# Patient Record
Sex: Female | Born: 1965
Health system: Southern US, Community
[De-identification: ages and names within clinical notes are randomized; demographics above are authoritative.]

## PROBLEM LIST (undated history)

## (undated) DIAGNOSIS — Z973 Presence of spectacles and contact lenses: Secondary | ICD-10-CM

## (undated) DIAGNOSIS — K219 Gastro-esophageal reflux disease without esophagitis: Secondary | ICD-10-CM

## (undated) DIAGNOSIS — F32A Depression, unspecified: Secondary | ICD-10-CM

## (undated) DIAGNOSIS — Z8489 Family history of other specified conditions: Secondary | ICD-10-CM

## (undated) DIAGNOSIS — C801 Malignant (primary) neoplasm, unspecified: Secondary | ICD-10-CM

## (undated) DIAGNOSIS — T7840XA Allergy, unspecified, initial encounter: Secondary | ICD-10-CM

## (undated) DIAGNOSIS — F419 Anxiety disorder, unspecified: Secondary | ICD-10-CM

## (undated) HISTORY — PX: TONSILLECTOMY AND ADENOIDECTOMY: SUR1326

## (undated) HISTORY — DX: Depression, unspecified: F32.A

## (undated) HISTORY — DX: Anxiety disorder, unspecified: F41.9

## (undated) HISTORY — DX: Allergy, unspecified, initial encounter: T78.40XA

## (undated) HISTORY — DX: Gastro-esophageal reflux disease without esophagitis: K21.9

---

## 1994-03-21 HISTORY — PX: OTHER SURGICAL HISTORY: SHX169

## 2005-11-03 ENCOUNTER — Ambulatory Visit: Payer: Self-pay | Admitting: Unknown Physician Specialty

## 2007-04-06 ENCOUNTER — Ambulatory Visit: Payer: Self-pay | Admitting: Unknown Physician Specialty

## 2008-07-28 ENCOUNTER — Ambulatory Visit: Payer: Self-pay | Admitting: Unknown Physician Specialty

## 2009-01-29 ENCOUNTER — Ambulatory Visit: Payer: Self-pay | Admitting: General Surgery

## 2010-08-06 ENCOUNTER — Ambulatory Visit: Payer: Self-pay | Admitting: Unknown Physician Specialty

## 2010-08-20 HISTORY — PX: DILATION AND CURETTAGE OF UTERUS: SHX78

## 2010-08-23 ENCOUNTER — Ambulatory Visit: Payer: Self-pay

## 2010-08-31 ENCOUNTER — Ambulatory Visit: Payer: Self-pay

## 2010-09-02 LAB — PATHOLOGY REPORT

## 2010-11-15 LAB — HM PAP SMEAR: HM Pap smear: NORMAL

## 2011-10-05 ENCOUNTER — Ambulatory Visit: Payer: Self-pay | Admitting: Internal Medicine

## 2011-10-10 ENCOUNTER — Ambulatory Visit (INDEPENDENT_AMBULATORY_CARE_PROVIDER_SITE_OTHER): Payer: PRIVATE HEALTH INSURANCE | Admitting: Internal Medicine

## 2011-10-10 ENCOUNTER — Encounter: Payer: Self-pay | Admitting: Internal Medicine

## 2011-10-10 VITALS — BP 112/68 | HR 85 | Temp 98.4°F | Resp 16 | Ht 67.5 in | Wt 176.8 lb

## 2011-10-10 DIAGNOSIS — J309 Allergic rhinitis, unspecified: Secondary | ICD-10-CM

## 2011-10-10 DIAGNOSIS — Z1239 Encounter for other screening for malignant neoplasm of breast: Secondary | ICD-10-CM

## 2011-10-10 DIAGNOSIS — Z6825 Body mass index (BMI) 25.0-25.9, adult: Secondary | ICD-10-CM

## 2011-10-10 DIAGNOSIS — E663 Overweight: Secondary | ICD-10-CM | POA: Insufficient documentation

## 2011-10-10 DIAGNOSIS — K644 Residual hemorrhoidal skin tags: Secondary | ICD-10-CM | POA: Insufficient documentation

## 2011-10-10 DIAGNOSIS — K219 Gastro-esophageal reflux disease without esophagitis: Secondary | ICD-10-CM | POA: Insufficient documentation

## 2011-10-10 DIAGNOSIS — J302 Other seasonal allergic rhinitis: Secondary | ICD-10-CM | POA: Insufficient documentation

## 2011-10-10 MED ORDER — HYDROCORTISONE ACETATE 25 MG RE SUPP: 25.0000 mg | Freq: Two times a day (BID) | RECTAL | Status: AC

## 2011-10-10 NOTE — Assessment & Plan Note (Signed)
Mild, with recent weight gain due to inactivity. Discussed resuming daily exercise now that her children are all in activities. Discussed the role of diet next size and weight management. Screening for thyroid and lipids and diabetes with next employee lab draw in October.

## 2011-10-10 NOTE — Assessment & Plan Note (Signed)
Intermittent and infrequent. Discussed using fiber supplements during episodes of loose stools to help bulk up stools, and Anusol HC versus Sitz baths to manage irritation. No indication for surgery at this time

## 2011-10-10 NOTE — Progress Notes (Signed)
Patient ID: Paula Jensen, female   DOB: Jul 05, 1965, 46 y.o.   MRN: [id number] Patient Active Problem List  Diagnosis  . Allergic rhinitis, seasonal  . GERD (gastroesophageal reflux disease)  . Overweight (BMI 25.0-29.9)  . External hemorrhoid, bleeding    Subjective:  CC:   Chief Complaint  Patient presents with  . New Patient    HPI:   Paula Jensen is a 46 y.o. female who presents as a new patient to establish primary care with the chief complaint of  Need for mammogram.  Lori's previous PCP was Francia Greaves,  Who was her PCP since 1999.  Last PAP Nov 2012.  All have been normal.  Mammogram due as of March.   3 kids,  Ages 46 7 and 61 .  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  Uses citalopram and prn alprazolam for anxiety disorder which began after birth of second a child. Has tried stopping them, periodically but has increased symptoms of irritability and anger without them.  Notices a dizzy feeling when she misses her dose of citalopram.  Relaxes with enjoyed activities including  gardening, cooking ,  And reading .  Drinks a glass of wine per night.  Has some night time hot flashes without actual sweats. Has a history of hemorrhoids,  agravated by infrequent episodes of  loose stools. Uses OTC suppositories prn.    Past Medical History  Diagnosis Date  . allergic rhinitis   . GERD (gastroesophageal reflux disease)     Past Surgical History  Procedure Date  . Dilation and curettage of uterus June 2012    [place] , for heavy bleeding   . Tonsillectomy and adenoidectomy   . Laparoscopy 1996    Family History  Problem Relation Age of Onset  . Heart disease Father     atrial fibrillation  . Cancer Neg Hx     History   Social History  . Marital Status: Married    Spouse Name: N/A    Number of Children: N/A  . Years of Education: N/A   Occupational History  . Not on file.   Social History Main  Topics  . Smoking status: Never Smoker   . Smokeless tobacco: Never Used  . Alcohol Use: Yes  . Drug Use: No  . Sexually Active: Not on file   Other Topics Concern  . Not on file   Social History Narrative  . No narrative on file    No Known Allergies   Review of Systems:  Review of Systems  Constitutional: Negative for fever, malaise/fatigue and diaphoresis.  HENT: Negative for ear pain and sore throat.   Eyes: Negative for blurred vision and pain.  Respiratory: Negative for cough and shortness of breath.   Cardiovascular: Negative for chest pain, orthopnea and leg swelling.  Gastrointestinal: Positive for heartburn and blood in stool. Negative for nausea and abdominal pain.  Genitourinary: Negative for dysuria and hematuria.  Musculoskeletal: Negative for myalgias and falls.  Skin: Negative for rash.  Neurological: Negative for dizziness and headaches.  Endo/Heme/Allergies: Does not bruise/bleed easily.  Psychiatric/Behavioral: Negative for depression. The patient is nervous/anxious.       Objective:  BP 112/68  Pulse 85  Temp 98.4 F (36.9 C) (Oral)  Resp 16  Ht 5' 7.5" (1.715 m)  Wt 176 lb 12 oz (80.173 kg)  BMI 27.27 kg/m2  SpO2  96%  LMP 09/13/2011  General appearance: alert, cooperative and appears stated age Ears: normal TM's and external ear canals both ears Throat: lips, mucosa, and tongue normal; teeth and gums normal Neck: no adenopathy, no carotid bruit, supple, symmetrical, trachea midline and thyroid not enlarged, symmetric, no tenderness/mass/nodules Back: symmetric, no curvature. ROM normal. No CVA tenderness. Lungs: clear to auscultation bilaterally Heart: regular rate and rhythm, S1, S2 normal, no murmur, click, rub or gallop Abdomen: soft, non-tender; bowel sounds normal; no masses,  no organomegaly Pulses: 2+ and symmetric Skin: Skin color, texture, turgor normal. No rashes or lesions Lymph nodes: Cervical, supraclavicular, and axillary  nodes normal.  Assessment and Plan:  Allergic rhinitis, seasonal Managed with by mouth Zyrtec. Recommended use of the simply saline nasal spray twice a day to manage any postnasal drip.  Overweight (BMI 25.0-29.9) Mild, with recent weight gain due to inactivity. Discussed resuming daily exercise now that her children are all in activities. Discussed the role of diet next size and weight management. Screening for thyroid and lipids and diabetes with next employee lab draw in October.  External hemorrhoid, bleeding Intermittent and infrequent. Discussed using fiber supplements during episodes of loose stools to help bulk up stools, and Anusol HC versus Sitz baths to manage irritation. No indication for surgery at this time   Updated Medication List Outpatient Encounter Prescriptions as of 10/10/2011  Medication Sig Dispense Refill  . ALPRAZolam (XANAX) 0.25 MG tablet Take 0.25 mg by mouth at bedtime as needed.      . citalopram (CELEXA) 20 MG tablet Take 20 mg by mouth daily.      . hydrocortisone (ANUSOL-HC) 25 MG suppository Place 1 suppository (25 mg total) rectally 2 (two) times daily.  12 suppository  0  . ranitidine (ZANTAC) 150 MG capsule Take 150 mg by mouth 2 (two) times daily.      Marland Kitchen zolpidem (AMBIEN) 10 MG tablet Take 10 mg by mouth at bedtime as needed.         Orders Placed This Encounter  Procedures  . MM Digital Screening    No Follow-up on file.

## 2011-10-10 NOTE — Assessment & Plan Note (Signed)
Managed with by mouth Zyrtec. Recommended use of the simply saline nasal spray twice a day to manage any postnasal drip.

## 2011-11-15 LAB — HM MAMMOGRAPHY: HM Mammogram: NORMAL

## 2011-11-17 ENCOUNTER — Ambulatory Visit: Payer: Self-pay | Admitting: Internal Medicine

## 2011-11-29 ENCOUNTER — Encounter: Payer: Self-pay | Admitting: Internal Medicine

## 2012-01-16 ENCOUNTER — Other Ambulatory Visit: Payer: Self-pay | Admitting: Internal Medicine

## 2012-01-16 MED ORDER — ZOLPIDEM TARTRATE 10 MG PO TABS: 10.0000 mg | ORAL_TABLET | Freq: Every evening | ORAL | Status: DC | PRN

## 2012-02-15 ENCOUNTER — Encounter: Payer: Self-pay | Admitting: Internal Medicine

## 2012-02-15 ENCOUNTER — Ambulatory Visit (INDEPENDENT_AMBULATORY_CARE_PROVIDER_SITE_OTHER): Payer: PRIVATE HEALTH INSURANCE | Admitting: Internal Medicine

## 2012-02-15 ENCOUNTER — Other Ambulatory Visit (HOSPITAL_COMMUNITY)
Admission: RE | Admit: 2012-02-15 | Discharge: 2012-02-15 | Disposition: A | Discharge status: Home / Self Care | Payer: PRIVATE HEALTH INSURANCE | Source: Ambulatory Visit | Attending: Internal Medicine | Admitting: Internal Medicine

## 2012-02-15 VITALS — BP 116/68 | HR 77 | Temp 97.9°F | Ht 67.0 in | Wt 186.0 lb

## 2012-02-15 DIAGNOSIS — Z6825 Body mass index (BMI) 25.0-25.9, adult: Secondary | ICD-10-CM

## 2012-02-15 DIAGNOSIS — Z01419 Encounter for gynecological examination (general) (routine) without abnormal findings: Secondary | ICD-10-CM | POA: Insufficient documentation

## 2012-02-15 DIAGNOSIS — E663 Overweight: Secondary | ICD-10-CM

## 2012-02-15 DIAGNOSIS — Z124 Encounter for screening for malignant neoplasm of cervix: Secondary | ICD-10-CM

## 2012-02-15 DIAGNOSIS — Z0000 Encounter for general adult medical examination without abnormal findings: Secondary | ICD-10-CM

## 2012-02-15 MED ORDER — CITALOPRAM HYDROBROMIDE 20 MG PO TABS: 20.0000 mg | ORAL_TABLET | Freq: Every day | ORAL | Status: DC

## 2012-02-17 DIAGNOSIS — Z0000 Encounter for general adult medical examination without abnormal findings: Secondary | ICD-10-CM | POA: Insufficient documentation

## 2012-02-17 DIAGNOSIS — Z0001 Encounter for general adult medical examination with abnormal findings: Secondary | ICD-10-CM | POA: Insufficient documentation

## 2012-02-17 NOTE — Assessment & Plan Note (Addendum)
Weight gain was addressed today during visit. Regular exercise and low glycemic index diet recommended. 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 Screening labs have been ordered for her, to be done at [facility]

## 2012-02-17 NOTE — Progress Notes (Signed)
Subjective:     Paula Jensen is a 46 y.o. female and is here for a comprehensive physical exam. The patient reports no problems.  History   Social History  . Marital Status: Married    Spouse Name: N/A    Number of Children: N/A  . Years of Education: N/A   Occupational History  . Not on file.   Social History Main Topics  . Smoking status: Never Smoker   . Smokeless tobacco: Never Used  . Alcohol Use: Yes  . Drug Use: No  . Sexually Active: Not on file   Other Topics Concern  . Not on file   Social History Narrative  . No narrative on file   Health Maintenance  Topic Date Due  . Tetanus/tdap  06/04/1983  . Influenza Vaccine  11/20/2011  . Pap Smear  02/06/2014    The following portions of the patient's history were reviewed and updated as appropriate: allergies, current medications, past family history, past medical history, past social history, past surgical history and problem list.  Review of Systems A comprehensive review of systems was negative.   Objective:   BP 116/68  Pulse 77  Temp 97.9 F (36.6 C) (Oral)  Ht 5\' 7"  (1.702 m)  Wt 186 lb (84.369 kg)  BMI 29.13 kg/m2  SpO2 98%  LMP 02/06/2012  General Appearance:    Alert, cooperative, no distress, appears stated age  Head:    Normocephalic, without obvious abnormality, atraumatic  Eyes:    PERRL, conjunctiva/corneas clear, EOM's intact, fundi    benign, both eyes  Ears:    Normal TM's and external ear canals, both ears  Nose:   Nares normal, septum midline, mucosa normal, no drainage    or sinus tenderness  Throat:   Lips, mucosa, and tongue normal; teeth and gums normal  Neck:   Supple, symmetrical, trachea midline, no adenopathy;    thyroid:  no enlargement/tenderness/nodules; no carotid   bruit or JVD  Back:     Symmetric, no curvature, ROM normal, no CVA tenderness  Lungs:     Clear to auscultation bilaterally, respirations unlabored  Chest Wall:    No tenderness or deformity   Heart:    Regular rate and rhythm, S1 and S2 normal, no murmur, rub   or gallop  Breast Exam:    No tenderness, masses, or nipple abnormality  Abdomen:     Soft, non-tender, bowel sounds active all four quadrants,    no masses, no organomegaly  Genitalia:    Normal female without lesion, discharge or tenderness  Extremities:   Extremities normal, atraumatic, no cyanosis or edema  Pulses:   2+ and symmetric all extremities  Skin:   Skin color, texture, turgor normal, no rashes or lesions  Lymph nodes:   Cervical, supraclavicular, and axillary nodes normal  Neurologic:   CNII-XII intact, normal strength, sensation and reflexes    throughout        Assessment:   Routine general medical examination at a health care facility Breast Pap and pelvic were done today. If her Pap smear is normal we will decrease screening to every 2-3 years.  Overweight (BMI 25.0-29.9) Weight gain was addressed today during visit. Regular exercise and low glycemic index diet recommended. 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 Screening labs have been ordered for her, to be done at [facility]   Updated Medication List Outpatient Encounter Prescriptions as of  02/15/2012  Medication Sig Dispense Refill  . citalopram (CELEXA) 20 MG tablet Take 1 tablet (20 mg total) by mouth daily.  30 tablet  11  . ranitidine (ZANTAC) 150 MG capsule Take 150 mg by mouth 2 (two) times daily.      Marland Kitchen zolpidem (AMBIEN) 10 MG tablet Take 1 tablet (10 mg total) by mouth at bedtime as needed.  30 tablet  3  . [DISCONTINUED] citalopram (CELEXA) 20 MG tablet Take 20 mg by mouth daily.      Marland Kitchen ALPRAZolam (XANAX) 0.25 MG tablet Take 0.25 mg by mouth at bedtime as needed.

## 2012-02-17 NOTE — Assessment & Plan Note (Signed)
Breast Pap and pelvic were done today. If her Pap smear is normal we will decrease screening to every 2-3 years.

## 2012-09-20 ENCOUNTER — Other Ambulatory Visit: Payer: Self-pay | Admitting: *Deleted

## 2012-09-26 MED ORDER — ZOLPIDEM TARTRATE 10 MG PO TABS: 10.0000 mg | ORAL_TABLET | Freq: Every evening | ORAL | Status: DC | PRN

## 2012-12-28 ENCOUNTER — Ambulatory Visit: Payer: Self-pay | Admitting: Internal Medicine

## 2013-01-15 ENCOUNTER — Encounter: Payer: Self-pay | Admitting: Internal Medicine

## 2013-02-27 ENCOUNTER — Encounter: Payer: PRIVATE HEALTH INSURANCE | Admitting: Internal Medicine

## 2013-03-12 ENCOUNTER — Other Ambulatory Visit: Payer: Self-pay | Admitting: Internal Medicine

## 2013-03-12 NOTE — Telephone Encounter (Signed)
Has appt 03/19/13, last visit 02/15/12, ok refill?

## 2013-03-19 ENCOUNTER — Ambulatory Visit (INDEPENDENT_AMBULATORY_CARE_PROVIDER_SITE_OTHER): Payer: 59 | Admitting: Internal Medicine

## 2013-03-19 ENCOUNTER — Encounter: Payer: Self-pay | Admitting: Internal Medicine

## 2013-03-19 VITALS — BP 110/80 | HR 86 | Temp 98.4°F | Ht 67.5 in | Wt 184.0 lb

## 2013-03-19 DIAGNOSIS — E663 Overweight: Secondary | ICD-10-CM

## 2013-03-19 DIAGNOSIS — K644 Residual hemorrhoidal skin tags: Secondary | ICD-10-CM

## 2013-03-19 DIAGNOSIS — R5381 Other malaise: Secondary | ICD-10-CM

## 2013-03-19 DIAGNOSIS — Z6825 Body mass index (BMI) 25.0-25.9, adult: Secondary | ICD-10-CM

## 2013-03-19 DIAGNOSIS — Z13220 Encounter for screening for lipoid disorders: Secondary | ICD-10-CM

## 2013-03-19 DIAGNOSIS — Z0000 Encounter for general adult medical examination without abnormal findings: Secondary | ICD-10-CM

## 2013-03-19 NOTE — Progress Notes (Signed)
Patient ID: Paula Jensen, female   DOB: 29-Dec-1965, 47 y.o.   MRN: [id number]    Subjective:     Paula Jensen is a 47 y.o. female and is here for a comprehensive physical exam. The patient reports as follows. Still having frequent problems with minimal rectal bleeding from hemorrhoids.  Denies constipation,  Has not tried the anusol yet. Denies pain.    History   Social History  . Marital Status: Married    Spouse Name: N/A    Number of Children: N/A  . Years of Education: N/A   Occupational History  . Not on file.   Social History Main Topics  . Smoking status: Never Smoker   . Smokeless tobacco: Never Used  . Alcohol Use: Yes  . Drug Use: No  . Sexual Activity: Not on file   Other Topics Concern  . Not on file   Social History Narrative  . No narrative on file   Health Maintenance  Topic Date Due  . Influenza Vaccine  10/19/2013  . Pap Smear  02/15/2015  . Tetanus/tdap  01/17/2022    The following portions of the patient's history were reviewed and updated as appropriate: allergies, current medications, past family history, past medical history, past social history, past surgical history and problem list.  Review of Systems A comprehensive review of systems was negative.   Objective:  BP 110/80  Pulse 86  Temp(Src) 98.4 F (36.9 C) (Oral)  Ht 5' 7.5" (1.715 m)  Wt 184 lb (83.462 kg)  BMI 28.38 kg/m2  SpO2 96%  LMP 03/16/2013  General Appearance:    Alert, cooperative, no distress, appears stated age  Head:    Normocephalic, without obvious abnormality, atraumatic  Eyes:    PERRL, conjunctiva/corneas clear, EOM's intact, fundi    benign, both eyes  Ears:    Normal TM's and external ear canals, both ears  Nose:   Nares normal, septum midline, mucosa normal, no drainage    or sinus tenderness  Throat:   Lips, mucosa, and tongue normal; teeth and gums normal  Neck:   Supple, symmetrical, trachea midline, no adenopathy;    thyroid:  no  enlargement/tenderness/nodules; no carotid   bruit or JVD  Back:     Symmetric, no curvature, ROM normal, no CVA tenderness  Lungs:     Clear to auscultation bilaterally, respirations unlabored  Chest Wall:    No tenderness or deformity   Heart:    Regular rate and rhythm, S1 and S2 normal, no murmur, rub   or gallop  Breast Exam:    Deferred per patient   Abdomen:     Soft, non-tender, bowel sounds active all four quadrants,    no masses, no organomegaly  Extremities:   Extremities normal, atraumatic, no cyanosis or edema  Pulses:   2+ and symmetric all extremities  Skin:   Skin color, texture, turgor normal, no rashes or lesions  Lymph nodes:   Cervical, supraclavicular, and axillary nodes normal  Neurologic:   CNII-XII intact, normal strength, sensation and reflexes    throughout    Assessment and Plan:   Routine general medical examination at a health care facility Annual comprehensive exam was done exccluding breast, pelvic and PAP smear. All screenings have been addressed .   Overweight (BMI 25.0-29.9) I have addressed  BMI and recommended wt loss of 10% of body weight over the next 6 months using a low glycemic index diet and regular exercise a minimum of 5  days per week.    External hemorrhoid, bleeding Recommended trial of anusol for  Days,  If still bothersome, discussed referral to Gen surg for definitive treatment   Updated Medication List Outpatient Encounter Prescriptions as of 03/19/2013  Medication Sig  . ALPRAZolam (XANAX) 0.25 MG tablet Take 0.25 mg by mouth at bedtime as needed.  . ANUCORT-HC 25 MG suppository Unwrap and insert 1 suppository rectally 2 times daily.  . citalopram (CELEXA) 20 MG tablet Take 1 tablet (20 mg total) by mouth daily.  . ranitidine (ZANTAC) 150 MG capsule Take 150 mg by mouth 2 (two) times daily.  Marland Kitchen zolpidem (AMBIEN) 10 MG tablet Take 1 tablet (10 mg total) by mouth at bedtime as needed.

## 2013-03-19 NOTE — Progress Notes (Signed)
Pre-visit discussion using our clinic review tool. No additional management support is needed unless otherwise documented below in the visit note.  

## 2013-03-19 NOTE — Patient Instructions (Signed)
Return for fasting labs when you have time.  We will repeat your PAP smear next year

## 2013-03-21 NOTE — Assessment & Plan Note (Signed)
Recommended trial of anusol for  Days,  If still bothersome, discussed referral to Gen surg for definitive treatment

## 2013-03-21 NOTE — Assessment & Plan Note (Signed)
Annual comprehensive exam was done exccluding breast, pelvic and PAP smear. All screenings have been addressed .

## 2013-03-21 NOTE — Assessment & Plan Note (Signed)
I have addressed  BMI and recommended wt loss of 10% of body weight over the next 6 months using a low glycemic index diet and regular exercise a minimum of 5 days per week.   

## 2013-03-28 ENCOUNTER — Other Ambulatory Visit (INDEPENDENT_AMBULATORY_CARE_PROVIDER_SITE_OTHER): Payer: 59

## 2013-03-28 DIAGNOSIS — Z13220 Encounter for screening for lipoid disorders: Secondary | ICD-10-CM

## 2013-03-28 DIAGNOSIS — R5381 Other malaise: Secondary | ICD-10-CM

## 2013-03-28 DIAGNOSIS — R5383 Other fatigue: Principal | ICD-10-CM

## 2013-03-28 LAB — LIPID PANEL
Cholesterol: 204 mg/dL — ABNORMAL HIGH (ref 0–200)
HDL: 73.3 mg/dL (ref >39.00)
Total CHOL/HDL Ratio: 3
Triglycerides: 79 mg/dL (ref 0.0–149.0)
VLDL: 15.8 mg/dL (ref 0.0–40.0)

## 2013-03-28 LAB — CBC WITH DIFFERENTIAL/PLATELET
BASOS ABS: 0 10*3/uL (ref 0.0–0.1)
Basophils Relative: 0.3 % (ref 0.0–3.0)
EOS PCT: 2 % (ref 0.0–5.0)
Eosinophils Absolute: 0.1 10*3/uL (ref 0.0–0.7)
HCT: 40.9 % (ref 36.0–46.0)
Hemoglobin: 13.8 g/dL (ref 12.0–15.0)
LYMPHS PCT: 38 % (ref 12.0–46.0)
Lymphs Abs: 2 10*3/uL (ref 0.7–4.0)
MCHC: 33.8 g/dL (ref 30.0–36.0)
MCV: 84.6 fl (ref 78.0–100.0)
MONOS PCT: 7.8 % (ref 3.0–12.0)
Monocytes Absolute: 0.4 10*3/uL (ref 0.1–1.0)
NEUTROS PCT: 51.9 % (ref 43.0–77.0)
Neutro Abs: 2.7 10*3/uL (ref 1.4–7.7)
PLATELETS: 190 10*3/uL (ref 150.0–400.0)
RBC: 4.83 Mil/uL (ref 3.87–5.11)
RDW: 15.2 % — AB (ref 11.5–14.6)
WBC: 5.2 10*3/uL (ref 4.5–10.5)

## 2013-03-28 LAB — COMPREHENSIVE METABOLIC PANEL
ALBUMIN: 3.8 g/dL (ref 3.5–5.2)
ALT: 13 U/L (ref 0–35)
AST: 13 U/L (ref 0–37)
Alkaline Phosphatase: 94 U/L (ref 39–117)
BUN: 18 mg/dL (ref 6–23)
CHLORIDE: 106 meq/L (ref 96–112)
CO2: 28 meq/L (ref 19–32)
Calcium: 8.7 mg/dL (ref 8.4–10.5)
Creatinine, Ser: 0.9 mg/dL (ref 0.4–1.2)
GFR: 70.79 mL/min (ref >60.00)
GLUCOSE: 98 mg/dL (ref 70–99)
POTASSIUM: 4 meq/L (ref 3.5–5.1)
SODIUM: 141 meq/L (ref 135–145)
Total Bilirubin: 0.8 mg/dL (ref 0.3–1.2)
Total Protein: 6.8 g/dL (ref 6.0–8.3)

## 2013-03-28 LAB — TSH: TSH: 1.37 u[IU]/mL (ref 0.35–5.50)

## 2013-03-28 LAB — LDL CHOLESTEROL, DIRECT: Direct LDL: 107.1 mg/dL

## 2013-04-01 ENCOUNTER — Encounter: Payer: Self-pay | Admitting: *Deleted

## 2013-04-11 ENCOUNTER — Other Ambulatory Visit: Payer: Self-pay | Admitting: Internal Medicine

## 2013-04-11 NOTE — Telephone Encounter (Signed)
Last visit 03/19/13, refill?

## 2013-06-19 ENCOUNTER — Telehealth: Payer: Self-pay | Admitting: *Deleted

## 2013-06-19 MED ORDER — ALPRAZOLAM 0.25 MG PO TABS: 0.2500 mg | ORAL_TABLET | Freq: Every evening | ORAL | Status: DC | PRN

## 2013-06-19 NOTE — Telephone Encounter (Signed)
Script faxed.

## 2013-06-19 NOTE — Telephone Encounter (Signed)
Patient called as requested refill on Xanax 0.25 mg going out of town and has anxiety about flying. Please advise last OV 12/14

## 2013-06-19 NOTE — Telephone Encounter (Signed)
Ok to refill,  Authorized in epic 

## 2013-10-18 ENCOUNTER — Other Ambulatory Visit: Payer: Self-pay | Admitting: Internal Medicine

## 2013-10-18 NOTE — Telephone Encounter (Signed)
Last refill 9.30.14, last OV 12.30.14.  Please advise refill.

## 2013-12-09 ENCOUNTER — Other Ambulatory Visit: Payer: Self-pay | Admitting: Internal Medicine

## 2013-12-09 NOTE — Telephone Encounter (Signed)
Last refill 7.20.15, last OV 12.30.14.  Please advise refill

## 2013-12-09 NOTE — Telephone Encounter (Signed)
Refill one 30 days only.  Has not been seen in over 6 months so needs office visit prior to any more refills 

## 2013-12-09 NOTE — Telephone Encounter (Signed)
Rx faxed

## 2013-12-29 ENCOUNTER — Emergency Department: Payer: Self-pay | Admitting: Emergency Medicine

## 2014-01-20 ENCOUNTER — Other Ambulatory Visit: Payer: Self-pay | Admitting: Internal Medicine

## 2014-01-29 ENCOUNTER — Other Ambulatory Visit: Payer: Self-pay | Admitting: Internal Medicine

## 2014-01-29 MED ORDER — ZOLPIDEM TARTRATE 10 MG PO TABS: ORAL_TABLET | ORAL | Status: DC

## 2014-03-19 ENCOUNTER — Ambulatory Visit (INDEPENDENT_AMBULATORY_CARE_PROVIDER_SITE_OTHER): Payer: 59 | Admitting: Internal Medicine

## 2014-03-19 ENCOUNTER — Encounter: Payer: 59 | Admitting: Internal Medicine

## 2014-03-19 ENCOUNTER — Encounter: Payer: Self-pay | Admitting: Internal Medicine

## 2014-03-19 ENCOUNTER — Other Ambulatory Visit (HOSPITAL_COMMUNITY)
Admission: RE | Admit: 2014-03-19 | Discharge: 2014-03-19 | Disposition: A | Discharge status: Home / Self Care | Payer: 59 | Source: Ambulatory Visit | Attending: Internal Medicine | Admitting: Internal Medicine

## 2014-03-19 VITALS — BP 104/72 | HR 85 | Temp 98.5°F | Resp 14 | Ht 68.0 in | Wt 192.5 lb

## 2014-03-19 DIAGNOSIS — Z01419 Encounter for gynecological examination (general) (routine) without abnormal findings: Secondary | ICD-10-CM | POA: Diagnosis present

## 2014-03-19 DIAGNOSIS — Z1151 Encounter for screening for human papillomavirus (HPV): Secondary | ICD-10-CM | POA: Diagnosis present

## 2014-03-19 DIAGNOSIS — E663 Overweight: Secondary | ICD-10-CM

## 2014-03-19 DIAGNOSIS — Z30011 Encounter for initial prescription of contraceptive pills: Secondary | ICD-10-CM

## 2014-03-19 DIAGNOSIS — Z124 Encounter for screening for malignant neoplasm of cervix: Secondary | ICD-10-CM

## 2014-03-19 DIAGNOSIS — Z309 Encounter for contraceptive management, unspecified: Secondary | ICD-10-CM

## 2014-03-19 DIAGNOSIS — E785 Hyperlipidemia, unspecified: Secondary | ICD-10-CM

## 2014-03-19 DIAGNOSIS — Z0000 Encounter for general adult medical examination without abnormal findings: Secondary | ICD-10-CM

## 2014-03-19 DIAGNOSIS — N951 Menopausal and female climacteric states: Secondary | ICD-10-CM

## 2014-03-19 DIAGNOSIS — R5383 Other fatigue: Secondary | ICD-10-CM

## 2014-03-19 DIAGNOSIS — Z1159 Encounter for screening for other viral diseases: Secondary | ICD-10-CM

## 2014-03-19 DIAGNOSIS — R748 Abnormal levels of other serum enzymes: Secondary | ICD-10-CM

## 2014-03-19 LAB — COMPREHENSIVE METABOLIC PANEL
ALT: 16 U/L (ref 0–35)
AST: 16 U/L (ref 0–37)
Albumin: 4.2 g/dL (ref 3.5–5.2)
Alkaline Phosphatase: 132 U/L — ABNORMAL HIGH (ref 39–117)
BUN: 18 mg/dL (ref 6–23)
CHLORIDE: 106 meq/L (ref 96–112)
CO2: 27 meq/L (ref 19–32)
Calcium: 9.3 mg/dL (ref 8.4–10.5)
Creatinine, Ser: 0.8 mg/dL (ref 0.4–1.2)
GFR: 78.5 mL/min (ref >60.00)
Glucose, Bld: 87 mg/dL (ref 70–99)
POTASSIUM: 4.2 meq/L (ref 3.5–5.1)
SODIUM: 141 meq/L (ref 135–145)
TOTAL PROTEIN: 7.3 g/dL (ref 6.0–8.3)
Total Bilirubin: 0.3 mg/dL (ref 0.2–1.2)

## 2014-03-19 LAB — VITAMIN B12: Vitamin B-12: 415 pg/mL (ref 211–911)

## 2014-03-19 LAB — CBC WITH DIFFERENTIAL/PLATELET
BASOS ABS: 0 10*3/uL (ref 0.0–0.1)
BASOS PCT: 0.4 % (ref 0.0–3.0)
EOS ABS: 0.1 10*3/uL (ref 0.0–0.7)
Eosinophils Relative: 1.2 % (ref 0.0–5.0)
HCT: 41.8 % (ref 36.0–46.0)
HEMOGLOBIN: 13.9 g/dL (ref 12.0–15.0)
LYMPHS PCT: 36.6 % (ref 12.0–46.0)
Lymphs Abs: 2.5 10*3/uL (ref 0.7–4.0)
MCHC: 33.4 g/dL (ref 30.0–36.0)
MCV: 86.2 fl (ref 78.0–100.0)
Monocytes Absolute: 0.4 10*3/uL (ref 0.1–1.0)
Monocytes Relative: 6.4 % (ref 3.0–12.0)
NEUTROS PCT: 55.4 % (ref 43.0–77.0)
Neutro Abs: 3.8 10*3/uL (ref 1.4–7.7)
Platelets: 213 10*3/uL (ref 150.0–400.0)
RBC: 4.84 Mil/uL (ref 3.87–5.11)
RDW: 13.8 % (ref 11.5–15.5)
WBC: 6.8 10*3/uL (ref 4.0–10.5)

## 2014-03-19 LAB — TSH: TSH: 1.38 u[IU]/mL (ref 0.35–4.50)

## 2014-03-19 LAB — LIPID PANEL
Cholesterol: 185 mg/dL (ref 0–200)
HDL: 77.1 mg/dL (ref >39.00)
LDL CALC: 87 mg/dL (ref 0–99)
NONHDL: 107.9
Total CHOL/HDL Ratio: 2
Triglycerides: 107 mg/dL (ref 0.0–149.0)
VLDL: 21.4 mg/dL (ref 0.0–40.0)

## 2014-03-19 LAB — FOLLICLE STIMULATING HORMONE: FSH: 72.2 m[IU]/mL

## 2014-03-19 LAB — LUTEINIZING HORMONE: LH: 46.38 m[IU]/mL

## 2014-03-19 MED ORDER — PHENTERMINE HCL 37.5 MG PO TABS: ORAL_TABLET | ORAL | Status: DC

## 2014-03-19 MED ORDER — ALPRAZOLAM 0.25 MG PO TABS: 0.2500 mg | ORAL_TABLET | Freq: Every evening | ORAL | Status: DC | PRN

## 2014-03-19 MED ORDER — CITALOPRAM HYDROBROMIDE 20 MG PO TABS: 20.0000 mg | ORAL_TABLET | Freq: Every day | ORAL | Status: DC

## 2014-03-19 MED ORDER — LEVONORGEST-ETH ESTRAD 91-DAY 0.15-0.03 MG PO TABS: 1.0000 | ORAL_TABLET | Freq: Every day | ORAL | Status: DC

## 2014-03-19 MED ORDER — HYDROCORTISONE ACETATE 25 MG RE SUPP: RECTAL | Status: DC

## 2014-03-19 MED ORDER — ZOLPIDEM TARTRATE 10 MG PO TABS: ORAL_TABLET | ORAL | Status: DC

## 2014-03-19 NOTE — Progress Notes (Addendum)
Patient ID: [name], female   DOB: 05/20/1965, 48 y.o.   MRN: [id number]   Subjective:     DELPHIA KAYLOR is a 48 y.o. female and is here for a comprehensive physical exam. The patient reports that In the interim since her last visit her menses have become irregular and heavy at times.  She has also developed nocturnal night sweats which are disrupting her sleep.   She has also gained 8 lbs since her last visit,  Despite following a healthy diet and walking several times per week.   History   Social History  . Marital Status: Married    Spouse Name: N/A    Number of Children: N/A  . Years of Education: N/A   Occupational History  . Not on file.   Social History Main Topics  . Smoking status: Never Smoker   . Smokeless tobacco: Never Used  . Alcohol Use: Yes  . Drug Use: No  . Sexual Activity: Not on file   Other Topics Concern  . Not on file   Social History Narrative   Health Maintenance  Topic Date Due  . INFLUENZA VACCINE  10/20/2014  . PAP SMEAR  03/19/2017  . TETANUS/TDAP  01/17/2022    The following portions of the patient's history were reviewed and updated as appropriate: allergies, current medications, past family history, past medical history, past social history, past surgical history and problem list.  Review of Systems  Patient denies headache, fevers, malaise, unintentional weight loss, skin rash, eye pain, sinus congestion and sinus pain, sore throat, dysphagia,  hemoptysis , cough, dyspnea, wheezing, chest pain, palpitations, orthopnea, edema, abdominal pain, nausea, melena, diarrhea, constipation, flank pain, dysuria, hematuria, urinary  Frequency, nocturia, numbness, tingling, seizures,  Focal weakness, Loss of consciousness,  Tremor, insomnia, depression, anxiety, and suicidal ideation.     Objective:   BP 104/72 mmHg  Pulse 85  Temp(Src) 98.5 F (36.9 C) (Oral)  Resp 14  Ht 5\' 8"  (1.727 m)  Wt 192 lb 8 oz (87.317 kg)  BMI 29.28  kg/m2  SpO2 99%  LMP 01/31/2014  General Appearance:    Alert, cooperative, no distress, appears stated age  Head:    Normocephalic, without obvious abnormality, atraumatic  Eyes:    PERRL, conjunctiva/corneas clear, EOM's intact, fundi    benign, both eyes  Ears:    Normal TM's and external ear canals, both ears  Nose:   Nares normal, septum midline, mucosa normal, no drainage    or sinus tenderness  Throat:   Lips, mucosa, and tongue normal; teeth and gums normal  Neck:   Supple, symmetrical, trachea midline, no adenopathy;    thyroid:  no enlargement/tenderness/nodules; no carotid   bruit or JVD  Back:     Symmetric, no curvature, ROM normal, no CVA tenderness  Lungs:     Clear to auscultation bilaterally, respirations unlabored  Chest Wall:    No tenderness or deformity   Heart:    Regular rate and rhythm, S1 and S2 normal, no murmur, rub   or gallop  Breast Exam:    No tenderness, masses, or nipple abnormality  Abdomen:     Soft, non-tender, bowel sounds active all four quadrants,    no masses, no organomegaly  Genitalia:    Pelvic: cervix normal in appearance, external genitalia normal, no adnexal masses or tenderness, no cervical motion tenderness, rectovaginal septum normal, uterus normal size, shape, and consistency and vagina normal without discharge  Extremities:   Extremities  normal, atraumatic, no cyanosis or edema  Pulses:   2+ and symmetric all extremities  Skin:   Skin color, texture, turgor normal, no rashes or lesions  Lymph nodes:   Cervical, supraclavicular, and axillary nodes normal  Neurologic:   CNII-XII intact, normal strength, sensation and reflexes    throughout     Assessment and Plan:   Problem List Items Addressed This Visit    Elevated alkaline phosphatase level    Etiology unclear,  She is asymptomatic.  Hep C screen is normal.  Will repeat in a few weeks.      OCP (oral contraceptive pills) initiation    She has no contraindications to start OCP  to suppress menses to 4 times per year and manage hot flashes.  Seasonale chosen for these reasons    Overweight (BMI 25.0-29.9)    She has had difficulty losing weight and maintaining weight loss  due to increased appetite and is requesting a trial of  Phentermine.  She is aware of the possible side effects and risks and understands that    The medication will be discontinued if she has not lost 5% of her body weight over the next 3 months, which , based on today's weight is  10 lbs.    Routine general medical examination at a health care facility - Primary    Annual  wellness  exam was done as well as a comprehensive physical exam (including breast and PAP)  and management of acute and chronic conditions .  During the course of the visit the patient was educated and counseled about appropriate screening and preventive services including : fall prevention , diabetes screening, nutrition counseling, colorectal cancer screening, and recommended immunizations.  Printed recommendations for health maintenance screenings was given.      Other Visit Diagnoses    Screening for cervical cancer        Relevant Orders       Cytology - PAP (Completed)    Other fatigue        Relevant Orders       TSH (Completed)       Comprehensive metabolic panel (Completed)       CBC with Differential (Completed)       B12 (Completed)    Need for hepatitis C screening test        Relevant Orders       Hepatitis C antibody (Completed)    Hyperlipidemia        Relevant Orders       Lipid panel (Completed)    Peri-menopausal        Relevant Orders       Follicle stimulating hormone (Completed)       LH (Completed)    Elevated alkaline phosphatase measurement        Relevant Orders       Hepatic function panel

## 2014-03-19 NOTE — Assessment & Plan Note (Addendum)
She has no contraindications to start OCP to suppress menses to 4 times per year and manage hot flashes.  Seasonale chosen for these reasons

## 2014-03-19 NOTE — Assessment & Plan Note (Signed)
She has had difficulty losing weight and maintaining weight loss  due to increased appetite and is requesting a trial of  Phentermine.  She is aware of the possible side effects and risks and understands that    The medication will be discontinued if she has not lost 5% of her body weight over the next 3 months, which , based on today's weight is  10 lbs.

## 2014-03-19 NOTE — Assessment & Plan Note (Signed)
Annual  wellness  exam was done as well as a comprehensive physical exam (including breast and PAP)  and management of acute and chronic conditions .  During the course of the visit the patient was educated and counseled about appropriate screening and preventive services including : fall prevention , diabetes screening, nutrition counseling, colorectal cancer screening, and recommended immunizations.  Printed recommendations for health maintenance screenings was given.

## 2014-03-19 NOTE — Patient Instructions (Signed)
I have authorized the use of phentermine for 3 months.  Please have your vital signs checked a week after starting, and return to see me in 3 months.  You can either take 1/2 tablet in the morning,  The second half by 2 PM to avoid insomnia,  Or   FULL TABLET IN THE MORNING  Goals to continue using the medication:   BP > 140/80  And pulse < 100  Weight loss of 10 lbs  by the end of the  3 months   This is  One version of a  "Low GI"  Diet:  It will still lower your blood sugars and allow you to lose 4 to 8  lbs  per month if you follow it carefully.  Your goal with exercise is a minimum of 30 minutes of aerobic exercise 5 days per week (Walking does not count once it becomes easy!)    All of the foods can be found at grocery stores and in bulk at [employer].  The Atkins protein bars and shakes are available in more varieties at [employer], [employer] and [place].     7 AM Breakfast:  Choose from the following:  Low carbohydrate Protein  Shakes (I recommend the EAS AdvantEdge "Carb Control" shakes  Or the low carb shakes by Atkins.    2.5 carbs   Arnold's "Sandwhich Thin"toasted  w/ peanut butter (no jelly: about 20 net carbs  "Bagel Thin" with cream cheese and salmon: about 20 carbs   a scrambled egg/bacon/cheese burrito made with Mission's "carb balance" whole wheat tortilla  (about 10 net carbs )   Avoid cereal and bananas, oatmeal and cream of wheat and grits. They are loaded with carbohydrates!   10 AM: high protein snack  Protein bar by Atkins (the snack size, under 200 cal, usually < 6 net carbs).    A stick of cheese:  Around 1 carb,  100 cal     Dannon Light n Fit [place] Yogurt  (80 cal, 8 carbs)  Other so called "protein bars" and Greek yogurts tend to be loaded with carbohydrates.  Remember, in food advertising, the word "energy" is synonymous for " carbohydrate."  Lunch:   A Sandwich using the bread choices listed, Can use any  Eggs,  lunchmeat, grilled meat or canned tuna),  avocado, regular mayo/mustard  and cheese.  A Salad using blue cheese, ranch,  Goddess or vinagrette,  No croutons or "confetti" and no "candied nuts" but regular nuts OK.   No pretzels or chips.  Pickles and miniature sweet peppers are a good low carb alternative that provide a "crunch"  The bread is the only source of carbohydrate in a sandwich and  can be decreased by trying some of these alternatives to traditional loaf bread  Joseph's makes a pita bread and a flat bread that are 50 cal and 4 net carbs available at [place] [employer] [place].  This can be toasted to use with hummous as well  Toufayan makes a low carb flatbread that's 100 cal and 9 net carbs available at [employer] and [employer]J's makes 2 sizes of  Low carb whole wheat tortilla  (The large one is 210 cal and 6 net carbs) Avoid "Low fat dressings, as well as Barry Brunner and [place] dressings They are loaded with sugar!   3 PM/ Mid day  Snack:  Consider  1 ounce of  almonds, walnuts, pistachios, pecans, peanuts,  Macadamia nuts or a nut  medley.  Avoid "granola"; the dried cranberries and raisins are loaded with carbohydrates. Mixed nuts as long as there are no raisins,  cranberries or dried fruit.    Try the prosciutto/mozzarella cheese sticks by Fiorruci  In deli /backery section   High protein      6 PM  Dinner:     Meat/fowl/fish with a green salad, and either broccoli, cauliflower, green beans, spinach, brussel sprouts or  Lima beans. DO NOT BREAD THE PROTEIN!!      There is a low carb pasta by Dreamfield's that is acceptable and tastes great: only 5 digestible carbs/serving.( All grocery stores but BJs carry it )  Try Hurley Cisco Angelo's chicken piccata or chicken or eggplant parm over low carb pasta.(Lowes and BJs)   Marjory Lies Sanchez's "Carnitas" (pulled pork, no sauce,  0 carbs) or his beef pot roast to make a dinner burrito (at BJ's)  Pesto over low carb pasta (bj's sells a good quality pesto in the center refrigerated section of  the deli   Try satueeing  Cheral Marker with mushroooms  Whole wheat pasta is still full of digestible carbs and  Not as low in glycemic index as Dreamfield's.   Brown rice is still rice,  So skip the rice and noodles if you eat [place] or [place] (or at least limit to 1/2 cup)  9 PM snack :   Breyer's "low carb" fudgsicle or  ice cream bar (Carb Smart line), or  Weight Watcher's ice cream bar , or another "no sugar added" ice cream;  a serving of fresh berries/cherries with whipped cream   Cheese or DANNON'S LlGHT N FIT [place] YOGURT  8 ounces of [place] unsweetened almond/cococunut milk    Avoid bananas, pineapple, grapes  and watermelon on a regular basis because they are high in sugar.  THINK OF THEM AS DESSERT  Remember that snack Substitutions should be less than 10 NET carbs per serving and meals < 20 carbs. Remember to subtract fiber grams to get the "net carbs."  Health Maintenance Adopting a healthy lifestyle and getting preventive care can go a long way to promote health and wellness. Talk with your health care provider about what schedule of regular examinations is right for you. This is a good chance for you to check in with your provider about disease prevention and staying healthy. In between checkups, there are plenty of things you can do on your own. Experts have done a lot of research about which lifestyle changes and preventive measures are most likely to keep you healthy. Ask your health care provider for more information. WEIGHT AND DIET  Eat a healthy diet  Be sure to include plenty of vegetables, fruits, low-fat dairy products, and lean protein.  Do not eat a lot of foods high in solid fats, added sugars, or salt.  Get regular exercise. This is one of the most important things you can do for your health.  Most adults should exercise for at least 150 minutes each week. The exercise should increase your heart rate and make you sweat (moderate-intensity exercise).  Most  adults should also do strengthening exercises at least twice a week. This is in addition to the moderate-intensity exercise.  Maintain a healthy weight  Body mass index (BMI) is a measurement that can be used to identify possible weight problems. It estimates body fat based on height and weight. Your health care provider can help determine your BMI and help you achieve or maintain a healthy weight.  For females 88 years of age and older:   A BMI below 18.5 is considered underweight.  A BMI of 18.5 to 24.9 is normal.  A BMI of 25 to 29.9 is considered overweight.  A BMI of 30 and above is considered obese.  Watch levels of cholesterol and blood lipids  You should start having your blood tested for lipids and cholesterol at 49 years of age, then have this test every 5 years.  You may need to have your cholesterol levels checked more often if:  Your lipid or cholesterol levels are high.  You are older than 49 years of age.  You are at high risk for heart disease.  CANCER SCREENING   Lung Cancer  Lung cancer screening is recommended for adults 42-33 years old who are at high risk for lung cancer because of a history of smoking.  A yearly low-dose CT scan of the lungs is recommended for people who:  Currently smoke.  Have quit within the past 15 years.  Have at least a 30-pack-year history of smoking. A pack year is smoking an average of one pack of cigarettes a day for 1 year.  Yearly screening should continue until it has been 15 years since you quit.  Yearly screening should stop if you develop a health problem that would prevent you from having lung cancer treatment.  Breast Cancer  Practice breast self-awareness. This means understanding how your breasts normally appear and feel.  It also means doing regular breast self-exams. Let your health care provider know about any changes, no matter how small.  If you are in your 20s or 30s, you should have a clinical  breast exam (CBE) by a health care provider every 1-3 years as part of a regular health exam.  If you are 15 or older, have a CBE every year. Also consider having a breast X-ray (mammogram) every year.  If you have a family history of breast cancer, talk to your health care provider about genetic screening.  If you are at high risk for breast cancer, talk to your health care provider about having an MRI and a mammogram every year.  Breast cancer gene (BRCA) assessment is recommended for women who have family members with BRCA-related cancers. BRCA-related cancers include:  Breast.  Ovarian.  Tubal.  Peritoneal cancers.  Results of the assessment will determine the need for genetic counseling and BRCA1 and BRCA2 testing. Cervical Cancer Routine pelvic examinations to screen for cervical cancer are no longer recommended for nonpregnant women who are considered low risk for cancer of the pelvic organs (ovaries, uterus, and vagina) and who do not have symptoms. A pelvic examination may be necessary if you have symptoms including those associated with pelvic infections. Ask your health care provider if a screening pelvic exam is right for you.   The Pap test is the screening test for cervical cancer for women who are considered at risk.  If you had a hysterectomy for a problem that was not cancer or a condition that could lead to cancer, then you no longer need Pap tests.  If you are older than 65 years, and you have had normal Pap tests for the past 10 years, you no longer need to have Pap tests.  If you have had past treatment for cervical cancer or a condition that could lead to cancer, you need Pap tests and screening for cancer for at least 20 years after your treatment.  If you no longer get a  Pap test, assess your risk factors if they change (such as having a new sexual partner). This can affect whether you should start being screened again.  Some women have medical problems that  increase their chance of getting cervical cancer. If this is the case for you, your health care provider may recommend more frequent screening and Pap tests.  The human papillomavirus (HPV) test is another test that may be used for cervical cancer screening. The HPV test looks for the virus that can cause cell changes in the cervix. The cells collected during the Pap test can be tested for HPV.  The HPV test can be used to screen women 14 years of age and older. Getting tested for HPV can extend the interval between normal Pap tests from three to five years.  An HPV test also should be used to screen women of any age who have unclear Pap test results.  After 49 years of age, women should have HPV testing as often as Pap tests.  Colorectal Cancer  This type of cancer can be detected and often prevented.  Routine colorectal cancer screening usually begins at 49 years of age and continues through 49 years of age.  Your health care provider may recommend screening at an earlier age if you have risk factors for colon cancer.  Your health care provider may also recommend using home test kits to check for hidden blood in the stool.  A small camera at the end of a tube can be used to examine your colon directly (sigmoidoscopy or colonoscopy). This is done to check for the earliest forms of colorectal cancer.  Routine screening usually begins at age 67.  Direct examination of the colon should be repeated every 5-10 years through 49 years of age. However, you may need to be screened more often if early forms of precancerous polyps or small growths are found. Skin Cancer  Check your skin from head to toe regularly.  Tell your health care provider about any new moles or changes in moles, especially if there is a change in a mole's shape or color.  Also tell your health care provider if you have a mole that is larger than the size of a pencil eraser.  Always use sunscreen. Apply sunscreen  liberally and repeatedly throughout the day.  Protect yourself by wearing long sleeves, pants, a wide-brimmed hat, and sunglasses whenever you are outside. HEART DISEASE, DIABETES, AND HIGH BLOOD PRESSURE   Have your blood pressure checked at least every 1-2 years. High blood pressure causes heart disease and increases the risk of stroke.  If you are between 43 years and 58 years old, ask your health care provider if you should take aspirin to prevent strokes.  Have regular diabetes screenings. This involves taking a blood sample to check your fasting blood sugar level.  If you are at a normal weight and have a low risk for diabetes, have this test once every three years after 49 years of age.  If you are overweight and have a high risk for diabetes, consider being tested at a younger age or more often. PREVENTING INFECTION  Hepatitis B  If you have a higher risk for hepatitis B, you should be screened for this virus. You are considered at high risk for hepatitis B if:  You were born in a country where hepatitis B is common. Ask your health care provider which countries are considered high risk.  Your parents were born in a high-risk country, and  you have not been immunized against hepatitis B (hepatitis B vaccine).  You have HIV or AIDS.  You use needles to inject street drugs.  You live with someone who has hepatitis B.  You have had sex with someone who has hepatitis B.  You get hemodialysis treatment.  You take certain medicines for conditions, including cancer, organ transplantation, and autoimmune conditions. Hepatitis C  Blood testing is recommended for:  Everyone born from [birth year] through 1966.  Anyone with known risk factors for hepatitis C. Sexually transmitted infections (STIs)  You should be screened for sexually transmitted infections (STIs) including gonorrhea and chlamydia if:  You are sexually active and are younger than 49 years of age.  You are older than  49 years of age and your health care provider tells you that you are at risk for this type of infection.  Your sexual activity has changed since you were last screened and you are at an increased risk for chlamydia or gonorrhea. Ask your health care provider if you are at risk.  If you do not have HIV, but are at risk, it may be recommended that you take a prescription medicine daily to prevent HIV infection. This is called pre-exposure prophylaxis (PrEP). You are considered at risk if:  You are sexually active and do not regularly use condoms or know the HIV status of your partner(s).  You take drugs by injection.  You are sexually active with a partner who has HIV. Talk with your health care provider about whether you are at high risk of being infected with HIV. If you choose to begin PrEP, you should first be tested for HIV. You should then be tested every 3 months for as long as you are taking PrEP.  PREGNANCY   If you are premenopausal and you may become pregnant, ask your health care provider about preconception counseling.  If you may become pregnant, take 400 to 800 micrograms (mcg) of folic acid every day.  If you want to prevent pregnancy, talk to your health care provider about birth control (contraception). OSTEOPOROSIS AND MENOPAUSE   Osteoporosis is a disease in which the bones lose minerals and strength with aging. This can result in serious bone fractures. Your risk for osteoporosis can be identified using a bone density scan.  If you are 32 years of age or older, or if you are at risk for osteoporosis and fractures, ask your health care provider if you should be screened.  Ask your health care provider whether you should take a calcium or vitamin D supplement to lower your risk for osteoporosis.  Menopause may have certain physical symptoms and risks.  Hormone replacement therapy may reduce some of these symptoms and risks. Talk to your health care provider about  whether hormone replacement therapy is right for you.  HOME CARE INSTRUCTIONS   Schedule regular health, dental, and eye exams.  Stay current with your immunizations.   Do not use any tobacco products including cigarettes, chewing tobacco, or electronic cigarettes.  If you are pregnant, do not drink alcohol.  If you are breastfeeding, limit how much and how often you drink alcohol.  Limit alcohol intake to no more than 1 drink per day for nonpregnant women. One drink equals 12 ounces of beer, 5 ounces of wine, or 1 ounces of hard liquor.  Do not use street drugs.  Do not share needles.  Ask your health care provider for help if you need support or information about quitting drugs.  Tell your health care provider if you often feel depressed.  Tell your health care provider if you have ever been abused or do not feel safe at home. Document Released: 09/20/2010 Document Revised: 07/22/2013 Document Reviewed: 02/06/2013 [facility] Patient Information 2015 [place], [place]. This information is not intended to replace advice given to you by your health care provider. Make sure you discuss any questions you have with your health care provider.

## 2014-03-19 NOTE — Progress Notes (Signed)
Pre-visit discussion using our clinic review tool. No additional management support is needed unless otherwise documented below in the visit note.  

## 2014-03-20 ENCOUNTER — Encounter: Payer: Self-pay | Admitting: Internal Medicine

## 2014-03-20 LAB — CYTOLOGY - PAP

## 2014-03-20 LAB — HEPATITIS C ANTIBODY: HCV Ab: NEGATIVE

## 2014-03-21 ENCOUNTER — Encounter: Payer: Self-pay | Admitting: Internal Medicine

## 2014-03-21 DIAGNOSIS — R748 Abnormal levels of other serum enzymes: Secondary | ICD-10-CM | POA: Insufficient documentation

## 2014-03-21 MED ORDER — SYRINGE (DISPOSABLE) 3 ML MISC: Status: DC

## 2014-03-21 MED ORDER — CYANOCOBALAMIN 1000 MCG/ML IJ SOLN: INTRAMUSCULAR | Status: DC

## 2014-03-21 NOTE — Assessment & Plan Note (Addendum)
Etiology unclear,  She is asymptomatic.  Hep C screen is normal.  Will repeat in a few weeks.

## 2014-03-21 NOTE — Addendum Note (Signed)
Addended by: Crecencio Mc on: 03/21/2014 05:13 PM   Modules accepted: Level of Service

## 2014-04-01 ENCOUNTER — Encounter: Payer: Self-pay | Admitting: Internal Medicine

## 2014-06-16 ENCOUNTER — Encounter: Payer: Self-pay | Admitting: Internal Medicine

## 2014-06-18 ENCOUNTER — Ambulatory Visit: Payer: 59 | Admitting: Internal Medicine

## 2014-06-23 ENCOUNTER — Ambulatory Visit (INDEPENDENT_AMBULATORY_CARE_PROVIDER_SITE_OTHER): Payer: 59 | Admitting: Internal Medicine

## 2014-06-23 ENCOUNTER — Encounter: Payer: Self-pay | Admitting: Internal Medicine

## 2014-06-23 VITALS — BP 108/74 | HR 67 | Temp 98.0°F | Resp 16 | Ht 68.0 in | Wt 169.2 lb

## 2014-06-23 DIAGNOSIS — E663 Overweight: Secondary | ICD-10-CM | POA: Diagnosis not present

## 2014-06-23 DIAGNOSIS — Z309 Encounter for contraceptive management, unspecified: Secondary | ICD-10-CM

## 2014-06-23 DIAGNOSIS — R748 Abnormal levels of other serum enzymes: Secondary | ICD-10-CM | POA: Diagnosis not present

## 2014-06-23 DIAGNOSIS — Z30011 Encounter for initial prescription of contraceptive pills: Secondary | ICD-10-CM

## 2014-06-23 LAB — HEPATIC FUNCTION PANEL
ALBUMIN: 3.7 g/dL (ref 3.5–5.2)
ALT: 9 U/L (ref 0–35)
AST: 14 U/L (ref 0–37)
Alkaline Phosphatase: 73 U/L (ref 39–117)
Bilirubin, Direct: 0.1 mg/dL (ref 0.0–0.3)
Total Bilirubin: 0.4 mg/dL (ref 0.2–1.2)
Total Protein: 6.7 g/dL (ref 6.0–8.3)

## 2014-06-23 NOTE — Progress Notes (Signed)
Patient ID: Paula Jensen, female   DOB: May 03, 1965, 49 y.o.   MRN: [id number]  Patient Active Problem List   Diagnosis Date Noted  . Elevated alkaline phosphatase level 03/21/2014  . OCP (oral contraceptive pills) initiation 03/19/2014  . Routine general medical examination at a health care facility 02/17/2012  . Overweight (BMI 25.0-29.9) 10/10/2011  . External hemorrhoid, bleeding 10/10/2011  . Allergic rhinitis, seasonal   . GERD (gastroesophageal reflux disease)     Subjective:  CC:   Chief Complaint  Patient presents with  . Follow-up    medication for weight    HPI:   Paula Jensen is a 49 y.o. female who presents for  Follow up on Overweight with medical therapy given.  She has lost 23 lbs since December, using 1/2 tablet of phentermine daily .  Her personal goal is 160 lbs. She is exercising 5 times per week  For 30 minutes, including some cardio to get heart rate up.  Quit drinkg so much beer,  Now imbibes only twice per week.  H  Menopausal syndrome .  She has been taking HRT with resolution of hot flashes but has not been taking  progesterone until recently due to an oversight.   Right hip bothering her in the am .  Feels stiff, improves with activity.  Not occurring with lunges and squats.     Past Medical History  Diagnosis Date  . allergic rhinitis   . GERD (gastroesophageal reflux disease)     Past Surgical History  Procedure Laterality Date  . Dilation and curettage of uterus  June 2012    [place] , for heavy bleeding   . Tonsillectomy and adenoidectomy    . Laparoscopy  1996       The following portions of the patient's history were reviewed and updated as appropriate: Allergies, current medications, and problem list.    Review of Systems:   Patient denies headache, fevers, malaise, unintentional weight loss, skin rash, eye pain, sinus congestion and sinus pain, sore throat, dysphagia,  hemoptysis , cough, dyspnea, wheezing, chest  pain, palpitations, orthopnea, edema, abdominal pain, nausea, melena, diarrhea, constipation, flank pain, dysuria, hematuria, urinary  Frequency, nocturia, numbness, tingling, seizures,  Focal weakness, Loss of consciousness,  Tremor, insomnia, depression, anxiety, and suicidal ideation.     History   Social History  . Marital Status: Married    Spouse Name: N/A  . Number of Children: N/A  . Years of Education: N/A   Occupational History  . Not on file.   Social History Main Topics  . Smoking status: Never Smoker   . Smokeless tobacco: Never Used  . Alcohol Use: Yes  . Drug Use: No  . Sexual Activity: Not on file   Other Topics Concern  . Not on file   Social History Narrative    Objective:  Filed Vitals:   06/23/14 0910  BP: 108/74  Pulse: 67  Temp: 98 F (36.7 C)  Resp: 16     General appearance: alert, cooperative and appears stated age Ears: normal TM's and external ear canals both ears Throat: lips, mucosa, and tongue normal; teeth and gums normal Neck: no adenopathy, no carotid bruit, supple, symmetrical, trachea midline and thyroid not enlarged, symmetric, no tenderness/mass/nodules Back: symmetric, no curvature. ROM normal. No CVA tenderness. Lungs: clear to auscultation bilaterally Heart: regular rate and rhythm, S1, S2 normal, no murmur, click, rub or gallop Abdomen: soft, non-tender; bowel sounds normal; no masses,  no organomegaly Pulses: 2+  and symmetric Skin: Skin color, texture, turgor normal. No rashes or lesions Lymph nodes: Cervical, supraclavicular, and axillary nodes normal.  Assessment and Plan:  Overweight (BMI 25.0-29.9) .improved BMI with use of phentermine,  Low GI diet and regular exercise.  Phentermine refilled x 1 month.    OCP (oral contraceptive pills) initiation continue use of Seasonale to suppress menstruation to 4 times per year.    Elevated alkaline phosphatase level  Hep C screen was normal.   repeat alk phos is normal  today.     A total of 25 minutes of face to face time was spent with patient more than half of which was spent in counselling about the above mentioned conditions  and coordination of care .   Updated Medication List Outpatient Encounter Prescriptions as of 06/23/2014  Medication Sig  . ALPRAZolam (XANAX) 0.25 MG tablet Take 1 tablet (0.25 mg total) by mouth at bedtime as needed.  . citalopram (CELEXA) 20 MG tablet Take 1 tablet (20 mg total) by mouth daily.  . hydrocortisone (ANUCORT-HC) 25 MG suppository Unwrap and insert 1 suppository rectally 2 times daily.  Marland Kitchen levonorgestrel-ethinyl estradiol (SEASONALE) 0.15-0.03 MG tablet Take 1 tablet by mouth daily.  . phentermine (ADIPEX-P) 37.5 MG tablet 1/2 tablet in the am and early afternoon  . ranitidine (ZANTAC) 150 MG capsule Take 150 mg by mouth 2 (two) times daily.  Marland Kitchen zolpidem (AMBIEN) 10 MG tablet TAKE ONE TABLET BY MOUTH AT BEDTIME AS NEEDED  . cyanocobalamin (,VITAMIN B-12,) 1000 MCG/ML injection For use with weekly B12 injections (Patient not taking: Reported on 06/23/2014)  . Syringe, Disposable, 3 ML MISC For use with B12 injections weekly/monthly (Patient not taking: Reported on 06/23/2014)     No orders of the defined types were placed in this encounter.    Return in about 3 months (around 09/22/2014).

## 2014-06-24 ENCOUNTER — Encounter: Payer: Self-pay | Admitting: Internal Medicine

## 2014-06-24 NOTE — Assessment & Plan Note (Signed)
Hep C screen was normal repeat alk phos is normal

## 2014-06-24 NOTE — Patient Instructions (Signed)
I have written a prescription for  phentermine for 1 more month.    Continue Seasonale for suppression of menstruation

## 2014-06-24 NOTE — Assessment & Plan Note (Signed)
.  improved BMI with use of phentermine,  Low GI diet and regular exercise.  Phentermine refilled x 1 month.

## 2014-06-24 NOTE — Assessment & Plan Note (Signed)
continue use of Seasonale to suppress menstruation to 4 times per year.

## 2014-06-26 ENCOUNTER — Encounter: Payer: Self-pay | Admitting: Internal Medicine

## 2014-09-17 ENCOUNTER — Telehealth: Payer: Self-pay | Admitting: *Deleted

## 2014-09-17 MED ORDER — ZOLPIDEM TARTRATE 10 MG PO TABS: ORAL_TABLET | ORAL | Status: DC

## 2014-09-17 MED ORDER — ALPRAZOLAM 0.25 MG PO TABS: 0.2500 mg | ORAL_TABLET | Freq: Every evening | ORAL | Status: DC | PRN

## 2014-09-17 NOTE — Telephone Encounter (Signed)
Rx faxed, pt aware. 

## 2014-09-17 NOTE — Telephone Encounter (Signed)
Ok to refill,  printed rx  

## 2014-09-17 NOTE — Telephone Encounter (Signed)
Pt called requesting Xanax and Ambien refills.  Last OV 4.4.16, last refill 12.30.15.  Please advise refills

## 2014-09-29 ENCOUNTER — Encounter: Payer: Self-pay | Admitting: Internal Medicine

## 2014-09-29 ENCOUNTER — Ambulatory Visit (INDEPENDENT_AMBULATORY_CARE_PROVIDER_SITE_OTHER): Payer: 59 | Admitting: Internal Medicine

## 2014-09-29 VITALS — BP 108/70 | HR 77 | Temp 98.8°F | Resp 16 | Ht 68.0 in | Wt 162.0 lb

## 2014-09-29 DIAGNOSIS — E663 Overweight: Secondary | ICD-10-CM

## 2014-09-29 DIAGNOSIS — Z1239 Encounter for other screening for malignant neoplasm of breast: Secondary | ICD-10-CM | POA: Diagnosis not present

## 2014-09-29 DIAGNOSIS — Z309 Encounter for contraceptive management, unspecified: Secondary | ICD-10-CM

## 2014-09-29 DIAGNOSIS — Z30011 Encounter for initial prescription of contraceptive pills: Secondary | ICD-10-CM

## 2014-09-29 NOTE — Patient Instructions (Signed)
YOU HAVE DONE VERY WELL!   RETURN IN 6 MONTHS FOR ANNUAL EXAM  MAMMOGRAM HJAS BEEN ORDERED  HEAPTIC PANEL October

## 2014-09-29 NOTE — Progress Notes (Signed)
Pre visit review using our clinic review tool, if applicable. No additional management support is needed unless otherwise documented below in the visit note. 

## 2014-09-29 NOTE — Progress Notes (Signed)
Subjective:  Patient ID: Paula Jensen, female    DOB: January 24, 1966  Age: 49 y.o. MRN: [id number]  CC: The primary encounter diagnosis was Breast cancer screening. Diagnoses of Overweight (BMI 25.0-29.9) and OCP (oral contraceptive pills) initiation were also pertinent to this visit.  HPI Paula Jensen presents for MEDICATION FOLLOWUP. She has been taking phentermine for the past several months as part of a comprehensive program to lose weight which includes regular participation in aerobic exercise 5 days per week and a carbohydrate restricted diet.  She has had no adverse effects from the phentermine She has reached her goal of 16 0 lbs  Body mass index is 24.64 kg/(m^2).   Has stopped taking phentermine and her weight has fluctuated by no more than 2 lbs. .  She has been taking [place]ale for regulation of irregular menses and is requesting refill.      Outpatient Prescriptions Prior to Visit  Medication Sig Dispense Refill  . ALPRAZolam (XANAX) 0.25 MG tablet Take 1 tablet (0.25 mg total) by mouth at bedtime as needed. 30 tablet 5  . citalopram (CELEXA) 20 MG tablet Take 1 tablet (20 mg total) by mouth daily. 30 tablet 5  . levonorgestrel-ethinyl estradiol (SEASONALE) 0.15-0.03 MG tablet Take 1 tablet by mouth daily. 1 Package 4  . zolpidem (AMBIEN) 10 MG tablet TAKE ONE TABLET BY MOUTH AT BEDTIME AS NEEDED 30 tablet 5  . ranitidine (ZANTAC) 150 MG capsule Take 150 mg by mouth 2 (two) times daily.    . cyanocobalamin (,VITAMIN B-12,) 1000 MCG/ML injection For use with weekly B12 injections (Patient not taking: Reported on 06/23/2014) 10 mL 2  . hydrocortisone (ANUCORT-HC) 25 MG suppository Unwrap and insert 1 suppository rectally 2 times daily. (Patient not taking: Reported on 09/29/2014) 12 suppository 0  . phentermine (ADIPEX-P) 37.5 MG tablet 1/2 tablet in the am and early afternoon (Patient not taking: Reported on 09/29/2014) 30 tablet 2  . Syringe, Disposable, 3 ML MISC For use  with B12 injections weekly/monthly (Patient not taking: Reported on 06/23/2014) 25 each 0   No facility-administered medications prior to visit.    Review of Systems;  Patient denies headache, fevers, malaise, unintentional weight loss, skin rash, eye pain, sinus congestion and sinus pain, sore throat, dysphagia,  hemoptysis , cough, dyspnea, wheezing, chest pain, palpitations, orthopnea, edema, abdominal pain, nausea, melena, diarrhea, constipation, flank pain, dysuria, hematuria, urinary  Frequency, nocturia, numbness, tingling, seizures,  Focal weakness, Loss of consciousness,  Tremor, insomnia, depression, anxiety, and suicidal ideation.      Objective:  BP 108/70 mmHg  Pulse 77  Temp(Src) 98.8 F (37.1 C) (Oral)  Resp 16  Ht 5\' 8"  (1.727 m)  Wt 162 lb (73.483 kg)  BMI 24.64 kg/m2  SpO2 98%  BP Readings from Last 3 Encounters:  09/29/14 108/70  06/23/14 108/74  03/19/14 104/72    Wt Readings from Last 3 Encounters:  09/29/14 162 lb (73.483 kg)  06/23/14 169 lb 4 oz (76.771 kg)  03/19/14 192 lb 8 oz (87.317 kg)    General appearance: alert, cooperative and appears stated age Ears: normal TM's and external ear canals both ears Throat: lips, mucosa, and tongue normal; teeth and gums normal Neck: no adenopathy, no carotid bruit, supple, symmetrical, trachea midline and thyroid not enlarged, symmetric, no tenderness/mass/nodules Back: symmetric, no curvature. ROM normal. No CVA tenderness. Lungs: clear to auscultation bilaterally Heart: regular rate and rhythm, S1, S2 normal, no murmur, click, rub or gallop Abdomen: soft, non-tender; bowel  sounds normal; no masses,  no organomegaly Pulses: 2+ and symmetric Skin: Skin color, texture, turgor normal. No rashes or lesions Lymph nodes: Cervical, supraclavicular, and axillary nodes normal.  No results found for: HGBA1C  Lab Results  Component Value Date   CREATININE 0.8 03/19/2014   CREATININE 0.9 03/28/2013    Lab  Results  Component Value Date   WBC 6.8 03/19/2014   HGB 13.9 03/19/2014   HCT 41.8 03/19/2014   PLT 213.0 03/19/2014   GLUCOSE 87 03/19/2014   CHOL 185 03/19/2014   TRIG 107.0 03/19/2014   HDL 77.10 03/19/2014   LDLDIRECT 107.1 03/28/2013   LDLCALC 87 03/19/2014   ALT 9 06/23/2014   AST 14 06/23/2014   NA 141 03/19/2014   K 4.2 03/19/2014   CL 106 03/19/2014   CREATININE 0.8 03/19/2014   BUN 18 03/19/2014   CO2 27 03/19/2014   TSH 1.38 03/19/2014    No results found.  Assessment & Plan:   Problem List Items Addressed This Visit      Unprioritized   Overweight (BMI 25.0-29.9)    I have congratulated her in reduction of   BMI and encouraged  Continued adherence to a low glycemic index diet and regular exercise a minimum of 5 days per week.  Phentermine has been discontinued       OCP (oral contraceptive pills) initiation    She is tolerating OCPS.  LFTS are normal.  Refills given  Lab Results  Component Value Date   ALT 9 06/23/2014   AST 14 06/23/2014   ALKPHOS 73 06/23/2014   BILITOT 0.4 06/23/2014         Other Visit Diagnoses    Breast cancer screening    -  Primary    Relevant Orders    MM DIGITAL SCREENING BILATERAL       I have discontinued Ms. Grenz's hydrocortisone, phentermine, cyanocobalamin, and Syringe (Disposable). I am also having her maintain her ranitidine, citalopram, levonorgestrel-ethinyl estradiol, ALPRAZolam, and zolpidem.  No orders of the defined types were placed in this encounter.   A total of 25 minutes of face to face time was spent with patient more than half of which was spent in counselling about the above mentioned conditions  and coordination of care  Medications Discontinued During This Encounter  Medication Reason  . Syringe, Disposable, 3 ML MISC   . phentermine (ADIPEX-P) 37.5 MG tablet   . hydrocortisone (ANUCORT-HC) 25 MG suppository   . cyanocobalamin (,VITAMIN B-12,) 1000 MCG/ML injection     Follow-up:  Return in about 6 months (around 04/01/2015).   Crecencio Mc, MD

## 2014-09-30 ENCOUNTER — Encounter: Payer: Self-pay | Admitting: Internal Medicine

## 2014-09-30 NOTE — Assessment & Plan Note (Signed)
I have congratulated her in reduction of   BMI and encouraged  Continued adherence to a low glycemic index diet and regular exercise a minimum of 5 days per week.  Phentermine has been discontinued

## 2014-09-30 NOTE — Assessment & Plan Note (Signed)
She is tolerating OCPS.  LFTS are normal.  Refills given  Lab Results  Component Value Date   ALT 9 06/23/2014   AST 14 06/23/2014   ALKPHOS 73 06/23/2014   BILITOT 0.4 06/23/2014

## 2014-10-23 ENCOUNTER — Other Ambulatory Visit: Payer: Self-pay

## 2014-10-23 ENCOUNTER — Encounter: Payer: Self-pay | Admitting: Internal Medicine

## 2014-10-23 MED ORDER — ZOLPIDEM TARTRATE 10 MG PO TABS: ORAL_TABLET | ORAL | Status: DC

## 2014-12-24 ENCOUNTER — Other Ambulatory Visit: Payer: 59

## 2014-12-26 ENCOUNTER — Other Ambulatory Visit: Payer: 59

## 2015-01-09 ENCOUNTER — Telehealth: Payer: Self-pay | Admitting: *Deleted

## 2015-01-09 ENCOUNTER — Other Ambulatory Visit (INDEPENDENT_AMBULATORY_CARE_PROVIDER_SITE_OTHER): Payer: 59

## 2015-01-09 DIAGNOSIS — Z79899 Other long term (current) drug therapy: Secondary | ICD-10-CM | POA: Diagnosis not present

## 2015-01-09 LAB — COMPREHENSIVE METABOLIC PANEL
ALBUMIN: 3.6 g/dL (ref 3.6–5.1)
ALK PHOS: 67 U/L (ref 33–130)
ALT: 7 U/L (ref 6–29)
AST: 12 U/L (ref 10–35)
BILIRUBIN TOTAL: 0.4 mg/dL (ref 0.2–1.2)
BUN: 16 mg/dL (ref 7–25)
CALCIUM: 8.5 mg/dL — AB (ref 8.6–10.4)
CO2: 24 mmol/L (ref 20–31)
Chloride: 104 mmol/L (ref 98–110)
Creat: 0.82 mg/dL (ref 0.50–1.05)
GLUCOSE: 88 mg/dL (ref 65–99)
POTASSIUM: 4.1 mmol/L (ref 3.5–5.3)
Sodium: 138 mmol/L (ref 135–146)
Total Protein: 6.3 g/dL (ref 6.1–8.1)

## 2015-01-09 NOTE — Telephone Encounter (Signed)
Labs and dx?  

## 2015-01-09 NOTE — Telephone Encounter (Signed)
Lab and dx?  

## 2015-01-11 ENCOUNTER — Encounter: Payer: Self-pay | Admitting: Internal Medicine

## 2015-01-21 ENCOUNTER — Ambulatory Visit
Admission: RE | Admit: 2015-01-21 | Discharge: 2015-01-21 | Disposition: A | Discharge status: Home / Self Care | Payer: 59 | Source: Ambulatory Visit | Attending: Internal Medicine | Admitting: Internal Medicine

## 2015-01-21 ENCOUNTER — Other Ambulatory Visit: Payer: Self-pay | Admitting: Internal Medicine

## 2015-01-21 DIAGNOSIS — Z1231 Encounter for screening mammogram for malignant neoplasm of breast: Secondary | ICD-10-CM | POA: Diagnosis not present

## 2015-01-21 DIAGNOSIS — Z1239 Encounter for other screening for malignant neoplasm of breast: Secondary | ICD-10-CM

## 2015-01-29 NOTE — Telephone Encounter (Signed)
Mailed unread message to patient.  

## 2015-02-20 ENCOUNTER — Encounter: Payer: Self-pay | Admitting: Internal Medicine

## 2015-02-24 NOTE — Telephone Encounter (Signed)
Please advise 

## 2015-02-25 MED ORDER — DOXYCYCLINE HYCLATE 50 MG PO TABS: 1.0000 | ORAL_TABLET | Freq: Every day | ORAL | Status: DC

## 2015-02-25 MED ORDER — CLINDAMYCIN PHOSPHATE 1 % EX GEL: Freq: Two times a day (BID) | CUTANEOUS | Status: DC

## 2015-02-25 NOTE — Telephone Encounter (Signed)
MEDS SENT TO [facility]

## 2015-04-03 ENCOUNTER — Encounter: Payer: Self-pay | Admitting: Internal Medicine

## 2015-04-03 ENCOUNTER — Ambulatory Visit (INDEPENDENT_AMBULATORY_CARE_PROVIDER_SITE_OTHER): Payer: 59 | Admitting: Internal Medicine

## 2015-04-03 VITALS — BP 110/76 | HR 83 | Temp 98.2°F | Resp 12 | Ht 67.25 in | Wt 167.0 lb

## 2015-04-03 DIAGNOSIS — G4700 Insomnia, unspecified: Secondary | ICD-10-CM

## 2015-04-03 DIAGNOSIS — Z0000 Encounter for general adult medical examination without abnormal findings: Secondary | ICD-10-CM

## 2015-04-03 DIAGNOSIS — Z309 Encounter for contraceptive management, unspecified: Secondary | ICD-10-CM | POA: Diagnosis not present

## 2015-04-03 DIAGNOSIS — Z30011 Encounter for initial prescription of contraceptive pills: Secondary | ICD-10-CM

## 2015-04-03 MED ORDER — ALPRAZOLAM 0.25 MG PO TABS: 0.2500 mg | ORAL_TABLET | Freq: Every evening | ORAL | Status: DC | PRN

## 2015-04-03 MED ORDER — LEVONORGEST-ETH ESTRAD 91-DAY 0.15-0.03 MG PO TABS: 1.0000 | ORAL_TABLET | Freq: Every day | ORAL | Status: DC

## 2015-04-03 MED ORDER — ZOLPIDEM TARTRATE 10 MG PO TABS: ORAL_TABLET | ORAL | Status: DC

## 2015-04-03 NOTE — Progress Notes (Signed)
Patient ID: Paula Jensen, female    DOB: 01-02-1966  Age: 50 y.o. MRN: [id number]  The patient is here for annual  wellness examination and management of other chronic and acute problems.    Has maintained a 25 lb weight loss,  Gain of 5 lbs since July however.  \ PAP normal 2015 Mammogram Nov 2016 Due for colonoscopy .  Wants to do the cologuard.  The risk factors are reflected in the social history.  The roster of all physicians providing medical care to patient - is listed in the Snapshot section of the chart.  Activities of daily living:  The patient is 100% independent in all ADLs: dressing, toileting, feeding as well as independent mobility  Home safety : The patient has smoke detectors in the home. They wear seatbelts.  There are no firearms at home. There is no violence in the home.   There is no risks for hepatitis, STDs or HIV. There is no   history of blood transfusion. They have no travel history to infectious disease endemic areas of the world.  The patient has seen their dentist in the last six month. They have seen their eye doctor in the last year. They admit to slight hearing difficulty with regard to whispered voices and some television programs.  [name]  They do not  have excessive sun exposure. Discussed the need for sun protection: hats, long sleeves and use of sunscreen if there is significant sun exposure.   Diet: the importance of a healthy diet is discussed. They do have a healthy diet.  The benefits of regular aerobic exercise were discussed. She walks 4 times per week ,  20 minutes.   Depression screen: there are no signs or vegative symptoms of depression- irritability, change in appetite, anhedonia, sadness/tearfullness.  Cognitive assessment: the patient manages all their financial and personal affairs and is actively engaged. They could relate day,date,year and events; recalled 2/3 objects at 3 minutes;  performed clock-face test normally.  The following portions of the patient's history were reviewed and updated as appropriate: allergies, current medications, past family history, past medical history,  past surgical history, past social history  and problem list.  Visual acuity was not assessed per patient preference since she has regular follow up with her ophthalmologist. Hearing and body mass index were assessed and reviewed.   During the course of the visit the patient was educated and counseled about appropriate screening and preventive services including : fall prevention , diabetes screening, nutrition counseling, colorectal cancer screening, and recommended immunizations.    CC: The primary encounter diagnosis was Encounter for preventive health examination. Diagnoses of OCP (oral contraceptive pills) initiation and Insomnia were also pertinent to this visit.  History [place] has a past medical history of allergic rhinitis and GERD (gastroesophageal reflux disease).   She has past surgical history that includes Dilation and curettage of uterus (June 2012); Tonsillectomy and adenoidectomy; and laparoscopy (1996).   Her family history includes Heart disease in her father. There is no history of Cancer.She reports that she has never smoked. She has never used smokeless tobacco. She reports that she drinks alcohol. She reports that she does not use illicit drugs.  Outpatient Prescriptions Prior to Visit  Medication Sig Dispense Refill  . clindamycin (CLINDAGEL) 1 % gel Apply topically 2 (two) times daily. 30 g 0  . Doxycycline Hyclate 50 MG TABS Take 1 tablet by mouth daily. 30 tablet 0  .  ranitidine (ZANTAC) 150 MG capsule Take 150 mg by mouth 2 (two) times daily.    Marland Kitchen ALPRAZolam (XANAX) 0.25 MG tablet Take 1 tablet (0.25 mg total) by mouth at bedtime as needed. 30 tablet 5  . levonorgestrel-ethinyl estradiol (SEASONALE) 0.15-0.03 MG tablet Take 1 tablet by mouth daily. 1 Package 4  .  zolpidem (AMBIEN) 10 MG tablet TAKE ONE TABLET BY MOUTH AT BEDTIME AS NEEDED 30 tablet 5  . citalopram (CELEXA) 20 MG tablet Take 1 tablet (20 mg total) by mouth daily. (Patient not taking: Reported on 04/03/2015) 30 tablet 5   No facility-administered medications prior to visit.    Review of Systems   Patient denies headache, fevers, malaise, unintentional weight loss, skin rash, eye pain, sinus congestion and sinus pain, sore throat, dysphagia,  hemoptysis , cough, dyspnea, wheezing, chest pain, palpitations, orthopnea, edema, abdominal pain, nausea, melena, diarrhea, constipation, flank pain, dysuria, hematuria, urinary  Frequency, nocturia, numbness, tingling, seizures,  Focal weakness, Loss of consciousness,  Tremor, insomnia, depression, anxiety, and suicidal ideation.      Objective:  BP 110/76 mmHg  Pulse 83  Temp(Src) 98.2 F (36.8 C) (Oral)  Resp 12  Ht 5' 7.25" (1.708 m)  Wt 167 lb (75.751 kg)  BMI 25.97 kg/m2  SpO2 98%  LMP 01/03/2015 (Approximate)  Physical Exam  General appearance: alert, cooperative and appears stated age Head: Normocephalic, without obvious abnormality, atraumatic Eyes: conjunctivae/corneas clear. PERRL, EOM's intact. Fundi benign. Ears: normal TM's and external ear canals both ears Nose: Nares normal. Septum midline. Mucosa normal. No drainage or sinus tenderness. Throat: lips, mucosa, and tongue normal; teeth and gums normal Neck: no adenopathy, no carotid bruit, no JVD, supple, symmetrical, trachea midline and thyroid not enlarged, symmetric, no tenderness/mass/nodules Lungs: clear to auscultation bilaterally Breasts: normal appearance, no masses or tenderness Heart: regular rate and rhythm, S1, S2 normal, no murmur, click, rub or gallop Abdomen: soft, non-tender; bowel sounds normal; no masses,  no organomegaly Extremities: extremities normal, atraumatic, no cyanosis or edema Pulses: 2+ and symmetric Skin: Skin color, texture, turgor normal.  No rashes or lesions Neurologic: Alert and oriented X 3, normal strength and tone. Normal symmetric reflexes. Normal coordination and gait.   Assessment & Plan:   Problem List Items Addressed This Visit    Encounter for preventive health examination - Primary    Annual comprehensive preventive exam was done as well as an evaluation and management of chronic conditions .  During the course of the visit the patient was educated and counseled about appropriate screening and preventive services including :  diabetes screening, lipid analysis with projected  10 year  risk for CAD , nutrition counseling, colorectal cancer screening, and recommended immunizations.  Printed recommendations for health maintenance screenings was given.       OCP (oral contraceptive pills) initiation    She is using OCPS for management of perimenopausal symptoms and regulation of menses      Insomnia    Chronic, with no improvement in prior trials of over-the-counter first generation antihistamines. Reviewed principles of good sleep hygiene. Although she is a snorer, there is no report of apneic spells by husband. Continue alternating ambien with alprazolam. The risks and benefits of benzodiazepine use were reviewed with patient today including excessive sedation leading to respiratory depression,  impaired thinking/driving, and addiction.  Patient was advised to avoid concurrent use with alcohol, to use medication only as needed and not to share with others  .  I have changed Ms. Jungbluth's levonorgestrel-ethinyl estradiol. I am also having her maintain her ranitidine, citalopram, Doxycycline Hyclate, clindamycin, ALPRAZolam, and zolpidem.  Meds ordered this encounter  Medications  . ALPRAZolam (XANAX) 0.25 MG tablet    Sig: Take 1 tablet (0.25 mg total) by mouth at bedtime as needed.    Dispense:  30 tablet    Refill:  5  . levonorgestrel-ethinyl estradiol (SEASONALE,INTROVALE,JOLESSA) 0.15-0.03 MG  tablet    Sig: Take 1 tablet by mouth daily.    Dispense:  1 Package    Refill:  3  . zolpidem (AMBIEN) 10 MG tablet    Sig: TAKE ONE TABLET BY MOUTH AT BEDTIME AS NEEDED    Dispense:  30 tablet    Refill:  5    Medications Discontinued During This Encounter  Medication Reason  . ALPRAZolam (XANAX) 0.25 MG tablet Reorder  . levonorgestrel-ethinyl estradiol (SEASONALE) 0.15-0.03 MG tablet Reorder  . zolpidem (AMBIEN) 10 MG tablet Reorder    Follow-up: Return in about 6 months (around 10/01/2015).   Crecencio Mc, MD

## 2015-04-03 NOTE — Patient Instructions (Signed)

## 2015-04-03 NOTE — Progress Notes (Signed)
Pre-visit discussion using our clinic review tool. No additional management support is needed unless otherwise documented below in the visit note.  

## 2015-04-05 DIAGNOSIS — G4700 Insomnia, unspecified: Secondary | ICD-10-CM | POA: Insufficient documentation

## 2015-04-05 NOTE — Assessment & Plan Note (Signed)
She is using OCPS for management of perimenopausal symptoms and regulation of menses

## 2015-04-05 NOTE — Assessment & Plan Note (Signed)
Chronic, with no improvement in prior trials of over-the-counter first generation antihistamines. Reviewed principles of good sleep hygiene. Although she is a snorer, there is no report of apneic spells by husband. Continue alternating ambien with alprazolam. The risks and benefits of benzodiazepine use were reviewed with patient today including excessive sedation leading to respiratory depression,  impaired thinking/driving, and addiction.  Patient was advised to avoid concurrent use with alcohol, to use medication only as needed and not to share with others  .

## 2015-04-05 NOTE — Assessment & Plan Note (Signed)
Annual comprehensive preventive exam was done as well as an evaluation and management of chronic conditions .  During the course of the visit the patient was educated and counseled about appropriate screening and preventive services including :  diabetes screening, lipid analysis with projected  10 year  risk for CAD , nutrition counseling, colorectal cancer screening, and recommended immunizations.  Printed recommendations for health maintenance screenings was given.   

## 2015-04-06 NOTE — Progress Notes (Signed)
cologuard ordered.

## 2015-04-30 DIAGNOSIS — K136 Irritative hyperplasia of oral mucosa: Secondary | ICD-10-CM | POA: Diagnosis not present

## 2015-05-30 DIAGNOSIS — Z1211 Encounter for screening for malignant neoplasm of colon: Secondary | ICD-10-CM | POA: Diagnosis not present

## 2015-05-30 DIAGNOSIS — Z1212 Encounter for screening for malignant neoplasm of rectum: Secondary | ICD-10-CM | POA: Diagnosis not present

## 2015-05-30 LAB — COLOGUARD: Cologuard: NEGATIVE

## 2015-06-12 ENCOUNTER — Telehealth: Payer: Self-pay | Admitting: Internal Medicine

## 2015-06-12 NOTE — Telephone Encounter (Signed)
Cologuard abstracted Negative .

## 2015-06-14 DIAGNOSIS — H524 Presbyopia: Secondary | ICD-10-CM | POA: Diagnosis not present

## 2015-06-16 ENCOUNTER — Encounter: Payer: Self-pay | Admitting: Internal Medicine

## 2015-06-20 MED ORDER — CITALOPRAM HYDROBROMIDE 20 MG PO TABS: 20.0000 mg | ORAL_TABLET | Freq: Every day | ORAL | Status: DC

## 2015-09-03 ENCOUNTER — Encounter: Payer: Self-pay | Admitting: Internal Medicine

## 2015-09-04 MED ORDER — HYDROCORTISONE ACETATE 25 MG RE SUPP: RECTAL | Status: DC

## 2015-09-30 ENCOUNTER — Other Ambulatory Visit: Payer: Self-pay | Admitting: Internal Medicine

## 2015-10-02 ENCOUNTER — Ambulatory Visit: Payer: 59 | Admitting: Internal Medicine

## 2015-10-27 ENCOUNTER — Ambulatory Visit: Payer: 59 | Admitting: Internal Medicine

## 2015-11-30 ENCOUNTER — Ambulatory Visit (INDEPENDENT_AMBULATORY_CARE_PROVIDER_SITE_OTHER): Payer: 59 | Admitting: Internal Medicine

## 2015-11-30 ENCOUNTER — Encounter: Payer: Self-pay | Admitting: Internal Medicine

## 2015-11-30 VITALS — BP 122/86 | HR 85 | Temp 98.6°F | Resp 16 | Ht 68.0 in | Wt 176.0 lb

## 2015-11-30 DIAGNOSIS — E663 Overweight: Secondary | ICD-10-CM

## 2015-11-30 DIAGNOSIS — F411 Generalized anxiety disorder: Secondary | ICD-10-CM | POA: Diagnosis not present

## 2015-11-30 DIAGNOSIS — Z1239 Encounter for other screening for malignant neoplasm of breast: Secondary | ICD-10-CM

## 2015-11-30 DIAGNOSIS — G4700 Insomnia, unspecified: Secondary | ICD-10-CM | POA: Diagnosis not present

## 2015-11-30 NOTE — Progress Notes (Signed)
Subjective:  Patient ID: Paula Jensen, female    DOB: 04-Nov-1965  Age: 50 y.o. MRN: [id number]  CC: The primary encounter diagnosis was Breast cancer screening. Diagnoses of Insomnia, Overweight (BMI 25.0-29.9), and Anxiety, generalized were also pertinent to this visit.  HPI Paula Jensen presents for follow up on depression,  With insomnia, last seen in January for CPE  Using alprazolam maybe twice per month .  Using [place] every night , still wakes up at 3:30 some nights .   Outpatient Medications Prior to Visit  Medication Sig Dispense Refill  . ALPRAZolam (XANAX) 0.25 MG tablet Take 1 tablet (0.25 mg total) by mouth at bedtime as needed. 30 tablet 5  . citalopram (CELEXA) 20 MG tablet Take 1 tablet (20 mg total) by mouth daily. 30 tablet 5  . clindamycin (CLINDAGEL) 1 % gel Apply topically 2 (two) times daily. 30 g 0  . hydrocortisone (ANUCORT-HC) 25 MG suppository Unwrap and insert 1 suppository rectally 2 times daily. 12 suppository 0  . levonorgestrel-ethinyl estradiol (SEASONALE,INTROVALE,JOLESSA) 0.15-0.03 MG tablet Take 1 tablet by mouth daily. 1 Package 3  . ranitidine (ZANTAC) 150 MG capsule Take 150 mg by mouth 2 (two) times daily.    Marland Kitchen zolpidem (AMBIEN) 10 MG tablet TAKE 1 TABLET BY MOUTH ONCE DAILY AT BEDTIME AS NEEDED 30 tablet 5  . Doxycycline Hyclate 50 MG TABS Take 1 tablet by mouth daily. (Patient not taking: Reported on 11/30/2015) 30 tablet 0   No facility-administered medications prior to visit.     Review of Systems;  Patient denies headache, fevers, malaise, unintentional weight loss, skin rash, eye pain, sinus congestion and sinus pain, sore throat, dysphagia,  hemoptysis , cough, dyspnea, wheezing, chest pain, palpitations, orthopnea, edema, abdominal pain, nausea, melena, diarrhea, constipation, flank pain, dysuria, hematuria, urinary  Frequency, nocturia, numbness, tingling, seizures,  Focal weakness, Loss of consciousness,  Tremor, insomnia,  depression, anxiety, and suicidal ideation.      Objective:  BP 122/86 (BP Location: Right Arm, Patient Position: Sitting, Cuff Size: Normal)   Pulse 85   Temp 98.6 F (37 C) (Oral)   Resp 16   Ht 5\' 8"  (1.727 m)   Wt 176 lb (79.8 kg)   LMP 10/05/2015   SpO2 96%   BMI 26.76 kg/m   BP Readings from Last 3 Encounters:  11/30/15 122/86  04/03/15 110/76  09/29/14 108/70    Wt Readings from Last 3 Encounters:  11/30/15 176 lb (79.8 kg)  04/03/15 167 lb (75.8 kg)  09/29/14 162 lb (73.5 kg)    General appearance: alert, cooperative and appears stated age Back: symmetric, no curvature. ROM normal. No CVA tenderness. Lungs: clear to auscultation bilaterally Heart: regular rate and rhythm, S1, S2 normal, no murmur, click, rub or gallop Abdomen: soft, non-tender; bowel sounds normal; no masses,  no organomegaly Pulses: 2+ and symmetric Skin: Skin color, texture, turgor normal. No rashes or lesions Lymph nodes: Cervical, supraclavicular, and axillary nodes normal.  No results found for: HGBA1C  Lab Results  Component Value Date   CREATININE 0.82 01/09/2015   CREATININE 0.8 03/19/2014   CREATININE 0.9 03/28/2013    Lab Results  Component Value Date   WBC 6.8 03/19/2014   HGB 13.9 03/19/2014   HCT 41.8 03/19/2014   PLT 213.0 03/19/2014   GLUCOSE 88 01/09/2015   CHOL 185 03/19/2014   TRIG 107.0 03/19/2014   HDL 77.10 03/19/2014   LDLDIRECT 107.1 03/28/2013   LDLCALC 87 03/19/2014   ALT  7 01/09/2015   AST 12 01/09/2015   NA 138 01/09/2015   K 4.1 01/09/2015   CL 104 01/09/2015   CREATININE 0.82 01/09/2015   BUN 16 01/09/2015   CO2 24 01/09/2015   TSH 1.38 03/19/2014    Mm Screening Breast Tomo Bilateral  Result Date: 01/22/2015 CLINICAL DATA:  Screening. EXAM: DIGITAL SCREENING BILATERAL MAMMOGRAM WITH 3D TOMO WITH CAD COMPARISON:  Previous exam(s). ACR Breast Density Category c: The breast tissue is heterogeneously dense, which may obscure small masses.  FINDINGS: There are no findings suspicious for malignancy. Images were processed with CAD. IMPRESSION: No mammographic evidence of malignancy. A result letter of this screening mammogram will be mailed directly to the patient. RECOMMENDATION: Screening mammogram in one year. (Code:SM-B-01Y) BI-RADS CATEGORY  1: Negative. Electronically Signed   By: Claudie Revering M.D.   On: 01/22/2015 09:28    Assessment & Plan:   Problem List Items Addressed This Visit    Overweight (BMI 25.0-29.9)    I have addressed  BMI and recommended a low glycemic index diet utilizing smaller more frequent meals to increase metabolism.  I have also recommended that patient start exercising with a goal of 30 minutes of aerobic exercise a minimum of 5 days per week.      Insomnia    Chronic, with no improvement in prior trials of over-the-counter first generation antihistamines. Reviewed principles of good sleep hygiene. Although she is a snorer, there is no report of apneic spells by husband. Continue alternating ambien with alprazolam. The risks and benefits of benzodiazepine use were reviewed with patient today including excessive sedation leading to respiratory depression,  impaired thinking/driving, and addiction.  Patient was advised to avoid concurrent use with alcohol, to use medication only as needed and not to share with others  .        Anxiety, generalized    Managed with citalopram and prn alprazolam. No changes today        Other Visit Diagnoses    Breast cancer screening    -  Primary   Relevant Orders   MM DIGITAL SCREENING BILATERAL      I am having [name] maintain her ranitidine, Doxycycline Hyclate, clindamycin, ALPRAZolam, levonorgestrel-ethinyl estradiol, citalopram, hydrocortisone, and zolpidem.  No orders of the defined types were placed in this encounter.   There are no discontinued medications.  Follow-up: No Follow-up on file.   Crecencio Mc, MD

## 2015-12-01 NOTE — Progress Notes (Signed)
Pre visit review using our clinic review tool, if applicable. No additional management support is needed unless otherwise documented below in the visit note. 

## 2015-12-02 DIAGNOSIS — F411 Generalized anxiety disorder: Secondary | ICD-10-CM | POA: Insufficient documentation

## 2015-12-02 NOTE — Assessment & Plan Note (Signed)
I have addressed  BMI and recommended a low glycemic index diet utilizing smaller more frequent meals to increase metabolism.  I have also recommended that patient start exercising with a goal of 30 minutes of aerobic exercise a minimum of 5 days per week.  

## 2015-12-02 NOTE — Assessment & Plan Note (Signed)
Managed with citalopram and prn alprazolam. No changes today  

## 2015-12-02 NOTE — Assessment & Plan Note (Signed)
Chronic, with no improvement in prior trials of over-the-counter first generation antihistamines. Reviewed principles of good sleep hygiene. Although she is a snorer, there is no report of apneic spells by husband. Continue alternating ambien with alprazolam. The risks and benefits of benzodiazepine use were reviewed with patient today including excessive sedation leading to respiratory depression,  impaired thinking/driving, and addiction.  Patient was advised to avoid concurrent use with alcohol, to use medication only as needed and not to share with others  .

## 2015-12-08 ENCOUNTER — Encounter: Payer: Self-pay | Admitting: Internal Medicine

## 2015-12-09 ENCOUNTER — Other Ambulatory Visit: Payer: Self-pay | Admitting: Internal Medicine

## 2015-12-09 DIAGNOSIS — E785 Hyperlipidemia, unspecified: Secondary | ICD-10-CM

## 2015-12-09 DIAGNOSIS — R5383 Other fatigue: Secondary | ICD-10-CM

## 2015-12-09 NOTE — Telephone Encounter (Signed)
Patient scheduled for Friday FYI 

## 2015-12-09 NOTE — Telephone Encounter (Signed)
-----   Message from Crecencio Mc, MD sent at 12/09/2015  1:44 PM EDT ----- Regarding: needs lab appt Patient needs fasting labs appt.

## 2015-12-11 ENCOUNTER — Other Ambulatory Visit (INDEPENDENT_AMBULATORY_CARE_PROVIDER_SITE_OTHER): Payer: 59

## 2015-12-11 DIAGNOSIS — E785 Hyperlipidemia, unspecified: Secondary | ICD-10-CM

## 2015-12-11 DIAGNOSIS — R5383 Other fatigue: Secondary | ICD-10-CM

## 2015-12-11 LAB — TSH: TSH: 1.16 u[IU]/mL (ref 0.35–4.50)

## 2015-12-11 LAB — LIPID PANEL
CHOLESTEROL: 130 mg/dL (ref 0–200)
HDL: 67 mg/dL (ref >39.00)
LDL CALC: 53 mg/dL (ref 0–99)
NonHDL: 63.34
Total CHOL/HDL Ratio: 2
Triglycerides: 52 mg/dL (ref 0.0–149.0)
VLDL: 10.4 mg/dL (ref 0.0–40.0)

## 2015-12-11 LAB — COMPREHENSIVE METABOLIC PANEL
ALBUMIN: 3.7 g/dL (ref 3.5–5.2)
ALT: 7 U/L (ref 0–35)
AST: 11 U/L (ref 0–37)
Alkaline Phosphatase: 62 U/L (ref 39–117)
BUN: 15 mg/dL (ref 6–23)
CALCIUM: 8.5 mg/dL (ref 8.4–10.5)
CHLORIDE: 105 meq/L (ref 96–112)
CO2: 27 mEq/L (ref 19–32)
Creatinine, Ser: 0.82 mg/dL (ref 0.40–1.20)
GFR: 77.95 mL/min (ref >60.00)
Glucose, Bld: 97 mg/dL (ref 70–99)
POTASSIUM: 4.2 meq/L (ref 3.5–5.1)
SODIUM: 139 meq/L (ref 135–145)
Total Bilirubin: 0.3 mg/dL (ref 0.2–1.2)
Total Protein: 6.6 g/dL (ref 6.0–8.3)

## 2015-12-13 ENCOUNTER — Encounter: Payer: Self-pay | Admitting: Internal Medicine

## 2015-12-16 ENCOUNTER — Other Ambulatory Visit: Payer: Self-pay | Admitting: Internal Medicine

## 2015-12-30 ENCOUNTER — Other Ambulatory Visit: Payer: Self-pay | Admitting: Internal Medicine

## 2015-12-30 DIAGNOSIS — Z1231 Encounter for screening mammogram for malignant neoplasm of breast: Secondary | ICD-10-CM

## 2016-01-29 ENCOUNTER — Encounter: Payer: Self-pay | Admitting: Internal Medicine

## 2016-01-29 ENCOUNTER — Ambulatory Visit
Admission: RE | Admit: 2016-01-29 | Discharge: 2016-01-29 | Disposition: A | Discharge status: Home / Self Care | Payer: 59 | Source: Ambulatory Visit | Attending: Internal Medicine | Admitting: Internal Medicine

## 2016-01-29 DIAGNOSIS — Z1231 Encounter for screening mammogram for malignant neoplasm of breast: Secondary | ICD-10-CM | POA: Insufficient documentation

## 2016-01-29 HISTORY — DX: Malignant (primary) neoplasm, unspecified: C80.1

## 2016-01-29 MED ORDER — PHENTERMINE HCL 37.5 MG PO TABS: 37.5000 mg | ORAL_TABLET | Freq: Every day | ORAL | 0 refills | Status: DC

## 2016-02-02 ENCOUNTER — Other Ambulatory Visit: Payer: Self-pay | Admitting: Internal Medicine

## 2016-02-03 ENCOUNTER — Other Ambulatory Visit: Payer: Self-pay | Admitting: Internal Medicine

## 2016-02-03 ENCOUNTER — Telehealth: Payer: Self-pay | Admitting: Internal Medicine

## 2016-02-03 MED ORDER — PHENTERMINE HCL 37.5 MG PO TABS: 37.5000 mg | ORAL_TABLET | Freq: Every day | ORAL | 0 refills | Status: DC

## 2016-02-03 NOTE — Telephone Encounter (Signed)
Pt called and stated that the pharmacy does not have a prescription for phentermine (ADIPEX-P) 37.5 MG tablet. Please advise, thank you!  Pharmacy - [place], [address]  Call pt @ [phone]

## 2016-02-03 NOTE — Telephone Encounter (Signed)
Rx faxed

## 2016-02-04 NOTE — Telephone Encounter (Signed)
Faxed 02/03/16

## 2016-04-04 ENCOUNTER — Ambulatory Visit (INDEPENDENT_AMBULATORY_CARE_PROVIDER_SITE_OTHER): Payer: 59 | Admitting: Internal Medicine

## 2016-04-04 ENCOUNTER — Encounter: Payer: Self-pay | Admitting: Internal Medicine

## 2016-04-04 ENCOUNTER — Other Ambulatory Visit: Payer: Self-pay | Admitting: Internal Medicine

## 2016-04-04 VITALS — BP 116/86 | HR 85 | Temp 98.4°F | Resp 16 | Ht 67.25 in | Wt 175.4 lb

## 2016-04-04 DIAGNOSIS — Z0000 Encounter for general adult medical examination without abnormal findings: Secondary | ICD-10-CM

## 2016-04-04 DIAGNOSIS — B9789 Other viral agents as the cause of diseases classified elsewhere: Secondary | ICD-10-CM

## 2016-04-04 DIAGNOSIS — K580 Irritable bowel syndrome with diarrhea: Secondary | ICD-10-CM

## 2016-04-04 DIAGNOSIS — R05 Cough: Secondary | ICD-10-CM | POA: Diagnosis not present

## 2016-04-04 DIAGNOSIS — Z309 Encounter for contraceptive management, unspecified: Secondary | ICD-10-CM

## 2016-04-04 DIAGNOSIS — R059 Cough, unspecified: Secondary | ICD-10-CM

## 2016-04-04 DIAGNOSIS — E663 Overweight: Secondary | ICD-10-CM

## 2016-04-04 DIAGNOSIS — J069 Acute upper respiratory infection, unspecified: Secondary | ICD-10-CM

## 2016-04-04 LAB — POCT INFLUENZA A/B
INFLUENZA A, POC: NEGATIVE
INFLUENZA B, POC: NEGATIVE

## 2016-04-04 MED ORDER — DICYCLOMINE HCL 20 MG PO TABS: 20.0000 mg | ORAL_TABLET | Freq: Four times a day (QID) | ORAL | 2 refills | Status: DC

## 2016-04-04 MED ORDER — ZOLPIDEM TARTRATE 5 MG PO TABS
ORAL_TABLET | ORAL | 5 refills | Status: DC
Start: 2016-04-04 — End: 2016-09-14

## 2016-04-04 NOTE — Progress Notes (Signed)
Pre-visit discussion using our clinic review tool. No additional management support is needed unless otherwise documented below in the visit note.  

## 2016-04-04 NOTE — Progress Notes (Signed)
Patient ID: Paula Jensen, female    DOB: 1966/01/29  Age: 51 y.o. MRN: [id number]  The patient is here for annual physical  examination and management of other chronic and acute problems.  Mammogram normal novn2017 Pap due 2019 Colon ca screen:? Negative cologuard march 2017 Remote history of abnormal pap 25 yrs ago.  Taking 3 month continual  OCPS,  Minimal breakthrough bleeding    The risk factors are reflected in the social history.  The roster of all physicians providing medical care to patient - is listed in the Snapshot section of the chart.   Home safety : The patient has smoke detectors in the home. They wear seatbelts.  There are no firearms at home. There is no violence in the home.   There is no risks for hepatitis, STDs or HIV. There is no   history of blood transfusion. They have no travel history to infectious disease endemic areas of the world.  The patient has seen their dentist in the last six month. They have seen their eye doctor in the last year. They admit to slight hearing difficulty with regard to whispered voices and some television programs.  [name]  They do not  have excessive sun exposure. Discussed the need for sun protection: hats, long sleeves and use of sunscreen if there is significant sun exposure.   Diet: the importance of a healthy diet is discussed. They do have a healthy diet.  The benefits of regular aerobic exercise were discussed. She has stopped exercising regularly but plans to resume 3 days per week given weight gain    Depression screen: there are no signs or vegative symptoms of depression- irritability, change in appetite, anhedonia, sadness/tearfullness.   The following portions of the patient's history were reviewed and updated as appropriate: allergies, current medications, past family history, past medical history,  past surgical history, past social history  and problem list.  Visual  acuity was not assessed per patient preference since she has regular follow up with her ophthalmologist. Hearing and body mass index were assessed and reviewed.   During the course of the visit the patient was educated and counseled about appropriate screening and preventive services including : fall prevention , diabetes screening, nutrition counseling, colorectal cancer screening, and recommended immunizations.    CC: The primary encounter diagnosis was Cough. Diagnoses of Encounter for preventive health examination, Encounter for contraceptive management, unspecified type, Viral URI with cough, Overweight (BMI 25.0-29.9), and Irritable bowel syndrome with diarrhea were also pertinent to this visit.   Has been having a deep productive Cough for the past 5 days ,  Followed by rhinitis. Positive flu exposure (husband) which occurred last Tuesday.  Patient started feeling poorly and spent the last 3 days in bed  due to extreme fatigue.   History Paula Jensen has a past medical history of allergic rhinitis; Cancer (Paula Jensen); and GERD (gastroesophageal reflux disease).   She has a past surgical history that includes Dilation and curettage of uterus (June 2012); Tonsillectomy and adenoidectomy; and laparoscopy (1996).   Her family history includes Heart disease in her father.She reports that she has never smoked. She has never used smokeless tobacco. She reports that she drinks about 3.6 oz of alcohol per week . She reports that she does not use drugs.  Outpatient Medications Prior to Visit  Medication Sig Dispense Refill  . ALPRAZolam (XANAX) 0.25 MG tablet Take 1 tablet (0.25 mg total) by mouth at bedtime  as needed. 30 tablet 5  . citalopram (CELEXA) 20 MG tablet TAKE 1 TABLET BY MOUTH DAILY. 30 tablet 5  . hydrocortisone (ANUCORT-HC) 25 MG suppository Unwrap and insert 1 suppository rectally 2 times daily. 12 suppository 0  . levonorgestrel-ethinyl estradiol (SEASONALE,INTROVALE,JOLESSA) 0.15-0.03 MG  tablet Take 1 tablet by mouth daily. 1 Package 3  . ranitidine (ZANTAC) 150 MG capsule Take 150 mg by mouth 2 (two) times daily.    Marland Kitchen zolpidem (AMBIEN) 10 MG tablet TAKE 1 TABLET BY MOUTH ONCE DAILY AT BEDTIME AS NEEDED 30 tablet 5  . clindamycin (CLINDAGEL) 1 % gel Apply topically 2 (two) times daily. (Patient not taking: Reported on 04/04/2016) 30 g 0  . Doxycycline Hyclate 50 MG TABS Take 1 tablet by mouth daily. (Patient not taking: Reported on 04/04/2016) 30 tablet 0  . phentermine (ADIPEX-P) 37.5 MG tablet Take 1 tablet (37.5 mg total) by mouth daily before breakfast. (Patient not taking: Reported on 04/04/2016) 30 tablet 0   No facility-administered medications prior to visit.     Review of Systems   Patient denies headache, fevers, unintentional weight loss, skin rash, eye pain, sinus pain,  dysphagia,  hemoptysis ,dyspnea, wheezing, chest pain, palpitations, orthopnea, edema, abdominal pain, nausea, melena, diarrhea, constipation, flank pain, dysuria, hematuria, urinary  Frequency, nocturia, numbness, tingling, seizures,  Focal weakness, Loss of consciousness,  Tremor, insomnia, depression, anxiety, and suicidal ideation.      Objective:  BP 116/86   Pulse 85   Temp 98.4 F (36.9 C) (Oral)   Resp 16   Ht 5' 7.25" (1.708 m)   Wt 175 lb 6 oz (79.5 kg)   SpO2 97%   BMI 27.26 kg/m   Physical Exam  General appearance: alert, cooperative and appears stated age Head: Normocephalic, without obvious abnormality, atraumatic Eyes: conjunctivae/corneas clear. PERRL, EOM's intact. Fundi benign. Ears: normal TM's and external ear canals both ears Nose: Nares normal. Septum midline. Mucosa normal. No drainage or sinus tenderness. Throat: lips, mucosa, and tongue normal; teeth and gums normal Neck: no adenopathy, no carotid bruit, no JVD, supple, symmetrical, trachea midline and thyroid not enlarged, symmetric, no tenderness/mass/nodules Lungs: clear to auscultation bilaterally Breasts:  normal appearance, no masses or tenderness Heart: regular rate and rhythm, S1, S2 normal, no murmur, click, rub or gallop Abdomen: soft, non-tender; bowel sounds normal; no masses,  no organomegaly Extremities: extremities normal, atraumatic, no cyanosis or edema Pulses: 2+ and symmetric Skin: Skin color, texture, turgor normal. No rashes or lesions Neurologic: Alert and oriented X 3, normal strength and tone. Normal symmetric reflexes. Normal coordination and gait.     Assessment & Plan:   Problem List Items Addressed This Visit    Contraceptive management    Patient is  suppressing menses and managing perimenopausal syndrome with recurrent hot flashes with oral contraceptives.   Lab Results  Component Value Date   ALT 7 12/11/2015   AST 11 12/11/2015   ALKPHOS 62 12/11/2015   BILITOT 0.3 12/11/2015         Encounter for preventive health examination    Annual comprehensive preventive exam was done as well as an evaluation and management of chronic conditions .  During the course of the visit the patient was educated and counseled about appropriate screening and preventive services including :  diabetes screening, lipid analysis with projected  10 year  risk for CAD , nutrition counseling, breast, cervical and colorectal cancer screening, and recommended immunizations.  Printed recommendations for health maintenance screenings was given  IBS (irritable bowel syndrome)    She has cramping and diarrhea intermittently,  Trial of bentyl offered.      Relevant Medications   dicyclomine (BENTYL) 20 MG tablet   Overweight (BMI 25.0-29.9)    I have addressed  BMI and recommended a low glycemic index diet utilizing smaller more frequent meals to increase metabolism.  I have also recommended that patient start exercising with a goal of 30 minutes of aerobic exercise a minimum of 5 days per week. Screening for lipid disorders, thyroid and diabetes was done in  September.   Lab Results   Component Value Date   TSH 1.16 12/11/2015   Lab Results  Component Value Date   CHOL 130 12/11/2015   HDL 67.00 12/11/2015   LDLCALC 53 12/11/2015   LDLDIRECT 107.1 03/28/2013   TRIG 52.0 12/11/2015   CHOLHDL 2 12/11/2015           Viral URI with cough    Rapid flu test is negative.  She is outside of the window for tamiflu prophylaxis from exposure to husband. Exam is normal .  supporitve care recommended.        Other Visit Diagnoses    Cough    -  Primary   Relevant Orders   POCT Influenza A/B (Completed)      I have changed Ms. Leyh's zolpidem. I am also having her start on dicyclomine. Additionally, I am having her maintain her ranitidine, Doxycycline Hyclate, clindamycin, ALPRAZolam, levonorgestrel-ethinyl estradiol, hydrocortisone, citalopram, and phentermine.  Meds ordered this encounter  Medications  . dicyclomine (BENTYL) 20 MG tablet    Sig: Take 1 tablet (20 mg total) by mouth every 6 (six) hours.    Dispense:  90 tablet    Refill:  2  . zolpidem (AMBIEN) 5 MG tablet    Sig: TAKE 1 TABLET BY MOUTH ONCE DAILY AT BEDTIME AS NEEDED    Dispense:  30 tablet    Refill:  5    Medications Discontinued During This Encounter  Medication Reason  . zolpidem (AMBIEN) 10 MG tablet Reorder    Follow-up: No Follow-up on file.   Crecencio Mc, MD

## 2016-04-04 NOTE — Patient Instructions (Addendum)
Goal is 1200 mg calcium daily    You might want to try a premixed protein drink called Premier Protein shake for breakfast or late night snack . It is great tasting,   very low sugar and available of < $2 serving at [facility] and  In bulk for $1.50/serving at [employer] and [employer]  .    Nutritional analysis :  160 cal  30 g protein  1 g sugar 50% calcium needs   [employer] and [employer]  Trial of dicyclomine for IBS .  Take it 30 mintues prior to meals  .  The  diet I discussed with you today is the 10 day Green Smoothie Cleansing /Detox Diet by Linden Dolin . available at [employer] or on [address] for around $10.  This is a low fat diet that elminates sugar, gluten, caffeine, alcohol and dairy products  for 10 days .  What you add back after the initial ten days is entirely up to  you!  You can expect to lose 5 to 10 lbs depending on how strict you are and how many meals your substitute . She also has a 30 day diet that has great recipes for dishes  PAP smear due Jan 2019

## 2016-04-05 DIAGNOSIS — J069 Acute upper respiratory infection, unspecified: Secondary | ICD-10-CM | POA: Insufficient documentation

## 2016-04-05 DIAGNOSIS — B9789 Other viral agents as the cause of diseases classified elsewhere: Secondary | ICD-10-CM

## 2016-04-05 DIAGNOSIS — K589 Irritable bowel syndrome without diarrhea: Secondary | ICD-10-CM | POA: Insufficient documentation

## 2016-04-05 NOTE — Assessment & Plan Note (Signed)
Rapid flu test is negative.  She is outside of the window for tamiflu prophylaxis from exposure to husband. Exam is normal .  supporitve care recommended.

## 2016-04-05 NOTE — Telephone Encounter (Signed)
Last OV 04/04/16 Next appt 10/03/2016 Please advise

## 2016-04-05 NOTE — Assessment & Plan Note (Signed)
I have addressed  BMI and recommended a low glycemic index diet utilizing smaller more frequent meals to increase metabolism.  I have also recommended that patient start exercising with a goal of 30 minutes of aerobic exercise a minimum of 5 days per week. Screening for lipid disorders, thyroid and diabetes was done in  September.   Lab Results  Component Value Date   TSH 1.16 12/11/2015   Lab Results  Component Value Date   CHOL 130 12/11/2015   HDL 67.00 12/11/2015   LDLCALC 53 12/11/2015   LDLDIRECT 107.1 03/28/2013   TRIG 52.0 12/11/2015   CHOLHDL 2 12/11/2015

## 2016-04-05 NOTE — Assessment & Plan Note (Signed)
She has cramping and diarrhea intermittently,  Trial of bentyl offered.

## 2016-04-05 NOTE — Assessment & Plan Note (Signed)
Annual comprehensive preventive exam was done as well as an evaluation and management of chronic conditions .  During the course of the visit the patient was educated and counseled about appropriate screening and preventive services including :  diabetes screening, lipid analysis with projected  10 year  risk for CAD , nutrition counseling, breast, cervical and colorectal cancer screening, and recommended immunizations.  Printed recommendations for health maintenance screenings was given 

## 2016-04-05 NOTE — Assessment & Plan Note (Signed)
Patient is  suppressing menses and managing perimenopausal syndrome with recurrent hot flashes with oral contraceptives.   Lab Results  Component Value Date   ALT 7 12/11/2015   AST 11 12/11/2015   ALKPHOS 62 12/11/2015   BILITOT 0.3 12/11/2015

## 2016-06-23 DIAGNOSIS — H524 Presbyopia: Secondary | ICD-10-CM | POA: Diagnosis not present

## 2016-06-24 ENCOUNTER — Other Ambulatory Visit: Payer: Self-pay | Admitting: Internal Medicine

## 2016-08-12 ENCOUNTER — Ambulatory Visit: Payer: Self-pay | Admitting: Physician Assistant

## 2016-08-12 VITALS — BP 109/70 | HR 75 | Temp 98.5°F

## 2016-08-12 DIAGNOSIS — L255 Unspecified contact dermatitis due to plants, except food: Secondary | ICD-10-CM

## 2016-08-12 MED ORDER — PREDNISONE 10 MG PO TABS
ORAL_TABLET | ORAL | 0 refills | Status: DC
Start: 2016-08-12 — End: 2016-08-12

## 2016-08-12 MED ORDER — PREDNISONE 10 MG PO TABS: ORAL_TABLET | ORAL | 0 refills | Status: DC

## 2016-08-12 NOTE — Progress Notes (Signed)
S:  Exposed to poison ivy.  Spreading from legs to arms and chin area.  Has been using OTC meds without improvement. O:  Vesicles to left thigh, right posterior leg, arms, wrist and neck area A:  Contact Dermatitis P:  Prednisone 60mg  6 day taper

## 2016-08-23 ENCOUNTER — Encounter: Payer: Self-pay | Admitting: Internal Medicine

## 2016-09-14 ENCOUNTER — Other Ambulatory Visit: Payer: Self-pay | Admitting: Internal Medicine

## 2016-09-15 NOTE — Telephone Encounter (Signed)
Refilled: 04/04/2016 refills #5 Last OV: 04/04/2016 Next OV: 10/03/2016

## 2016-09-16 NOTE — Telephone Encounter (Signed)
Called in and pt was notified.

## 2016-09-16 NOTE — Telephone Encounter (Signed)
ambien refill authorized,  Ok to call in.

## 2016-09-23 ENCOUNTER — Other Ambulatory Visit: Payer: Self-pay | Admitting: Internal Medicine

## 2016-10-03 ENCOUNTER — Encounter: Payer: Self-pay | Admitting: Internal Medicine

## 2016-10-03 ENCOUNTER — Ambulatory Visit (INDEPENDENT_AMBULATORY_CARE_PROVIDER_SITE_OTHER): Payer: 59 | Admitting: Internal Medicine

## 2016-10-03 DIAGNOSIS — F5101 Primary insomnia: Secondary | ICD-10-CM

## 2016-10-03 DIAGNOSIS — E663 Overweight: Secondary | ICD-10-CM

## 2016-10-03 DIAGNOSIS — K580 Irritable bowel syndrome with diarrhea: Secondary | ICD-10-CM | POA: Diagnosis not present

## 2016-10-03 DIAGNOSIS — F411 Generalized anxiety disorder: Secondary | ICD-10-CM | POA: Diagnosis not present

## 2016-10-03 MED ORDER — CLINDAMYCIN PHOSPHATE 1 % EX GEL: Freq: Two times a day (BID) | CUTANEOUS | 5 refills | Status: DC

## 2016-10-03 MED ORDER — DOXYCYCLINE HYCLATE 50 MG PO TABS: 1.0000 | ORAL_TABLET | Freq: Every day | ORAL | 11 refills | Status: DC

## 2016-10-03 NOTE — Progress Notes (Signed)
Subjective:  Patient ID: Paula Jensen, female    DOB: [birth year]  Age: 51 y.o. MRN: [id number]  CC: Diagnoses of Irritable bowel syndrome with diarrhea, Anxiety, generalized, Primary insomnia, and Overweight (BMI 25.0-29.9) were pertinent to this visit.  HPI Paula Jensen presents for follow up on multiple issues including GAD with insomnia. And overweight.   Overweight:  Reviewed her previous success at weight loss:  She weighed 192  Lbs in Dec 2015 and had lLost 30 lbs  To a nadir of 162 lbs by July 2016. Using weight watchers.  Has dropped 8 lbs.  GAD:  anxiety controlled with citalopram.  No panic attacks. Sleeping well.  Nocturnal cough has resolved with zantac ,  No longer taking it .   Reduced her ambien to 5 mg dose due to excessive  nocturnal activity on the higher dose. Marland Kitchen   Hemorrhoids have improved.      Outpatient Medications Prior to Visit  Medication Sig Dispense Refill  . ALPRAZolam (XANAX) 0.25 MG tablet Take 1 tablet (0.25 mg total) by mouth at bedtime as needed. 30 tablet 5  . citalopram (CELEXA) 20 MG tablet TAKE 1 TABLET BY MOUTH ONCE DAILY 30 tablet 2  . hydrocortisone (ANUCORT-HC) 25 MG suppository Unwrap and insert 1 suppository rectally 2 times daily. 12 suppository 0  . SETLAKIN 0.15-0.03 MG tablet TAKE 1 TABLET BY MOUTH DAILY. 91 tablet 3  . zolpidem (AMBIEN) 5 MG tablet TAKE 1 TABLET BY MOUTH ONCE DAILY AT BEDTIME AS NEEDED 30 tablet 5  . clindamycin (CLINDAGEL) 1 % gel Apply topically 2 (two) times daily. 30 g 0  . dicyclomine (BENTYL) 20 MG tablet Take 1 tablet (20 mg total) by mouth every 6 (six) hours. 90 tablet 2  . ranitidine (ZANTAC) 150 MG capsule Take 150 mg by mouth 2 (two) times daily.    . Doxycycline Hyclate 50 MG TABS Take 1 tablet by mouth daily. (Patient not taking: Reported on 04/04/2016) 30 tablet 0  . phentermine (ADIPEX-P) 37.5 MG tablet Take 1 tablet (37.5 mg total) by mouth daily before breakfast. (Patient not taking: Reported  on 04/04/2016) 30 tablet 0  . predniSONE (DELTASONE) 10 MG tablet Take 6 tablets  today, on day 2 take 5 tablets, day 3 take 4 tablets, day 4 take 3 tablets, day 5 take  2 tablets and 1 tablet the last day (Patient not taking: Reported on 10/03/2016) 21 tablet 0   No facility-administered medications prior to visit.     Review of Systems;  Patient denies headache, fevers, malaise, unintentional weight loss, skin rash, eye pain, sinus congestion and sinus pain, sore throat, dysphagia,  hemoptysis , cough, dyspnea, wheezing, chest pain, palpitations, orthopnea, edema, abdominal pain, nausea, melena, diarrhea, constipation, flank pain, dysuria, hematuria, urinary  Frequency, nocturia, numbness, tingling, seizures,  Focal weakness, Loss of consciousness,  Tremor, insomnia, depression, anxiety, and suicidal ideation.      Objective:  BP 118/86 (BP Location: Left Arm, Patient Position: Sitting, Cuff Size: Normal)   Pulse 66   Temp 98 F (36.7 C) (Oral)   Resp 15   Ht 5' 7.25" (1.708 m)   Wt 175 lb 12.8 oz (79.7 kg)   SpO2 99%   BMI 27.33 kg/m   BP Readings from Last 3 Encounters:  10/03/16 118/86  08/12/16 109/70  04/04/16 116/86    Wt Readings from Last 3 Encounters:  10/03/16 175 lb 12.8 oz (79.7 kg)  04/04/16 175 lb 6 oz (79.5 kg)  11/30/15 176 lb (79.8 kg)    General appearance: alert, cooperative and appears stated age Ears: normal TM's and external ear canals both ears Throat: lips, mucosa, and tongue normal; teeth and gums normal Neck: no adenopathy, no carotid bruit, supple, symmetrical, trachea midline and thyroid not enlarged, symmetric, no tenderness/mass/nodules Back: symmetric, no curvature. ROM normal. No CVA tenderness. Lungs: clear to auscultation bilaterally Heart: regular rate and rhythm, S1, S2 normal, no murmur, click, rub or gallop Abdomen: soft, non-tender; bowel sounds normal; no masses,  no organomegaly Pulses: 2+ and symmetric Skin: Skin color, texture,  turgor normal. No rashes or lesions Lymph nodes: Cervical, supraclavicular, and axillary nodes normal.  No results found for: HGBA1C  Lab Results  Component Value Date   CREATININE 0.82 12/11/2015   CREATININE 0.82 01/09/2015   CREATININE 0.8 03/19/2014    Lab Results  Component Value Date   WBC 6.8 03/19/2014   HGB 13.9 03/19/2014   HCT 41.8 03/19/2014   PLT 213.0 03/19/2014   GLUCOSE 97 12/11/2015   CHOL 130 12/11/2015   TRIG 52.0 12/11/2015   HDL 67.00 12/11/2015   LDLDIRECT 107.1 03/28/2013   LDLCALC 53 12/11/2015   ALT 7 12/11/2015   AST 11 12/11/2015   NA 139 12/11/2015   K 4.2 12/11/2015   CL 105 12/11/2015   CREATININE 0.82 12/11/2015   BUN 15 12/11/2015   CO2 27 12/11/2015   TSH 1.16 12/11/2015     Assessment & Plan:   Problem List Items Addressed This Visit    Anxiety, generalized    Managed with citalopram and prn alprazolam. No changes today       IBS (irritable bowel syndrome)    Did not tolerate dicyclomine due to sedating side effects of medication .  Symptoms are currently controlled without medications      Insomnia    Chronic, with no improvement in prior trials of over-the-counter first generation antihistamines. Reviewed principles of good sleep hygiene. Although she is a snorer, there is no report of apneic spells by husband. Continue alternating ambien 5 mg  with alprazolam 0.25 mg . The risks and benefits of benzodiazepine use were reviewed with patient today including excessive sedation leading to respiratory depression,  impaired thinking/driving, and addiction.  Patient was advised to avoid concurrent use with alcohol, to use medication only as needed and not to share with others  .        Overweight (BMI 25.0-29.9)    I have addressed  BMI and recommended a low glycemic index diet utilizing smaller more frequent meals to increase metabolism.  I have also recommended that patient start exercising with a goal of 30 minutes of aerobic  exercise a minimum of 5 days per week.        A total of 25 minutes of face to face time was spent with patient more than half of which was spent in counselling about the above mentioned conditions  and coordination of care   I have discontinued Ms. Reliford's ranitidine, phentermine, dicyclomine, and predniSONE. I am also having her maintain her ALPRAZolam, hydrocortisone, SETLAKIN, zolpidem, citalopram, Doxycycline Hyclate, and clindamycin.  Meds ordered this encounter  Medications  . Doxycycline Hyclate 50 MG TABS    Sig: Take 1 tablet by mouth daily.    Dispense:  30 tablet    Refill:  11  . clindamycin (CLINDAGEL) 1 % gel    Sig: Apply topically 2 (two) times daily.    Dispense:  30 g  Refill:  5    Medications Discontinued During This Encounter  Medication Reason  . phentermine (ADIPEX-P) 37.5 MG tablet Patient has not taken in last 30 days  . predniSONE (DELTASONE) 10 MG tablet Therapy completed  . Doxycycline Hyclate 50 MG TABS Reorder  . dicyclomine (BENTYL) 20 MG tablet   . clindamycin (CLINDAGEL) 1 % gel Reorder  . ranitidine (ZANTAC) 150 MG capsule     Follow-up: Return in about 6 months (around 04/05/2017) for CPE, fasting labs prior .   Crecencio Mc, MD

## 2016-10-05 NOTE — Assessment & Plan Note (Signed)
Managed with citalopram and prn alprazolam. No changes today

## 2016-10-05 NOTE — Assessment & Plan Note (Signed)
I have addressed  BMI and recommended a low glycemic index diet utilizing smaller more frequent meals to increase metabolism.  I have also recommended that patient start exercising with a goal of 30 minutes of aerobic exercise a minimum of 5 days per week.  

## 2016-10-05 NOTE — Assessment & Plan Note (Signed)
Did not tolerate dicyclomine due to sedating side effects of medication .  Symptoms are currently controlled without medications

## 2016-10-05 NOTE — Assessment & Plan Note (Signed)
Chronic, with no improvement in prior trials of over-the-counter first generation antihistamines. Reviewed principles of good sleep hygiene. Although she is a snorer, there is no report of apneic spells by husband. Continue alternating ambien 5 mg  with alprazolam 0.25 mg . The risks and benefits of benzodiazepine use were reviewed with patient today including excessive sedation leading to respiratory depression,  impaired thinking/driving, and addiction.  Patient was advised to avoid concurrent use with alcohol, to use medication only as needed and not to share with others  .

## 2016-10-26 ENCOUNTER — Other Ambulatory Visit: Payer: Self-pay | Admitting: Internal Medicine

## 2016-10-27 NOTE — Telephone Encounter (Signed)
Faxed 10/26/2016

## 2016-12-01 ENCOUNTER — Other Ambulatory Visit: Payer: Self-pay | Admitting: Internal Medicine

## 2016-12-28 ENCOUNTER — Other Ambulatory Visit: Payer: Self-pay | Admitting: Internal Medicine

## 2017-02-28 ENCOUNTER — Other Ambulatory Visit: Payer: Self-pay | Admitting: Internal Medicine

## 2017-03-01 NOTE — Telephone Encounter (Signed)
Pt is out of ambien and was calling to check on status

## 2017-03-01 NOTE — Telephone Encounter (Signed)
Refilled: 09/16/2016 Last OV: 10/05/2016 Next OV: 04/07/2017

## 2017-03-01 NOTE — Telephone Encounter (Signed)
Please notify patient that the prescription  was Refilled for 60 days only because it has been 5 months since last visit. Marland Kitchen  OFFICE VISIT NEEDED prior to any more refills

## 2017-03-02 NOTE — Telephone Encounter (Signed)
rx has been printed, signed, and faxed. Pt has an appt scheduled for 04/07/2017.

## 2017-03-28 ENCOUNTER — Other Ambulatory Visit: Payer: Self-pay | Admitting: Internal Medicine

## 2017-03-29 ENCOUNTER — Other Ambulatory Visit: Payer: Self-pay | Admitting: Internal Medicine

## 2017-03-29 DIAGNOSIS — Z1231 Encounter for screening mammogram for malignant neoplasm of breast: Secondary | ICD-10-CM

## 2017-03-30 ENCOUNTER — Ambulatory Visit
Admission: RE | Admit: 2017-03-30 | Discharge: 2017-03-30 | Disposition: A | Discharge status: Home / Self Care | Payer: 59 | Source: Ambulatory Visit | Attending: Internal Medicine | Admitting: Internal Medicine

## 2017-03-30 DIAGNOSIS — Z1231 Encounter for screening mammogram for malignant neoplasm of breast: Secondary | ICD-10-CM | POA: Insufficient documentation

## 2017-04-07 ENCOUNTER — Encounter: Payer: Self-pay | Admitting: Internal Medicine

## 2017-04-07 ENCOUNTER — Ambulatory Visit (INDEPENDENT_AMBULATORY_CARE_PROVIDER_SITE_OTHER): Payer: 59

## 2017-04-07 ENCOUNTER — Ambulatory Visit (INDEPENDENT_AMBULATORY_CARE_PROVIDER_SITE_OTHER): Payer: 59 | Admitting: Internal Medicine

## 2017-04-07 ENCOUNTER — Other Ambulatory Visit (HOSPITAL_COMMUNITY)
Admission: RE | Admit: 2017-04-07 | Discharge: 2017-04-07 | Disposition: A | Discharge status: Home / Self Care | Payer: 59 | Source: Ambulatory Visit | Attending: Internal Medicine | Admitting: Internal Medicine

## 2017-04-07 VITALS — BP 108/70 | HR 87 | Temp 98.3°F | Resp 15 | Ht 67.25 in | Wt 187.4 lb

## 2017-04-07 DIAGNOSIS — M19072 Primary osteoarthritis, left ankle and foot: Secondary | ICD-10-CM | POA: Diagnosis not present

## 2017-04-07 DIAGNOSIS — E559 Vitamin D deficiency, unspecified: Secondary | ICD-10-CM | POA: Insufficient documentation

## 2017-04-07 DIAGNOSIS — R5383 Other fatigue: Secondary | ICD-10-CM | POA: Diagnosis not present

## 2017-04-07 DIAGNOSIS — M79672 Pain in left foot: Secondary | ICD-10-CM | POA: Diagnosis not present

## 2017-04-07 DIAGNOSIS — Z0000 Encounter for general adult medical examination without abnormal findings: Secondary | ICD-10-CM

## 2017-04-07 DIAGNOSIS — Z124 Encounter for screening for malignant neoplasm of cervix: Secondary | ICD-10-CM

## 2017-04-07 DIAGNOSIS — E663 Overweight: Secondary | ICD-10-CM

## 2017-04-07 LAB — COMPREHENSIVE METABOLIC PANEL
ALBUMIN: 3.8 g/dL (ref 3.5–5.2)
ALK PHOS: 64 U/L (ref 39–117)
ALT: 9 U/L (ref 0–35)
AST: 13 U/L (ref 0–37)
BILIRUBIN TOTAL: 0.3 mg/dL (ref 0.2–1.2)
BUN: 15 mg/dL (ref 6–23)
CALCIUM: 9.4 mg/dL (ref 8.4–10.5)
CO2: 30 mEq/L (ref 19–32)
Chloride: 103 mEq/L (ref 96–112)
Creatinine, Ser: 0.78 mg/dL (ref 0.40–1.20)
GFR: 82.16 mL/min (ref >60.00)
Glucose, Bld: 90 mg/dL (ref 70–99)
POTASSIUM: 3.8 meq/L (ref 3.5–5.1)
Sodium: 139 mEq/L (ref 135–145)
Total Protein: 6.4 g/dL (ref 6.0–8.3)

## 2017-04-07 LAB — CBC WITH DIFFERENTIAL/PLATELET
BASOS ABS: 0 10*3/uL (ref 0.0–0.1)
Basophils Relative: 0.7 % (ref 0.0–3.0)
EOS ABS: 0.1 10*3/uL (ref 0.0–0.7)
Eosinophils Relative: 1.4 % (ref 0.0–5.0)
HCT: 41.4 % (ref 36.0–46.0)
Hemoglobin: 13.7 g/dL (ref 12.0–15.0)
Lymphocytes Relative: 31.6 % (ref 12.0–46.0)
Lymphs Abs: 1.4 10*3/uL (ref 0.7–4.0)
MCHC: 33 g/dL (ref 30.0–36.0)
MCV: 90.4 fl (ref 78.0–100.0)
MONOS PCT: 7.3 % (ref 3.0–12.0)
Monocytes Absolute: 0.3 10*3/uL (ref 0.1–1.0)
NEUTROS ABS: 2.6 10*3/uL (ref 1.4–7.7)
Neutrophils Relative %: 59 % (ref 43.0–77.0)
PLATELETS: 279 10*3/uL (ref 150.0–400.0)
RBC: 4.58 Mil/uL (ref 3.87–5.11)
RDW: 13.2 % (ref 11.5–15.5)
WBC: 4.3 10*3/uL (ref 4.0–10.5)

## 2017-04-07 LAB — TSH: TSH: 1.35 u[IU]/mL (ref 0.35–4.50)

## 2017-04-07 LAB — VITAMIN B12: VITAMIN B 12: 367 pg/mL (ref 211–911)

## 2017-04-07 LAB — VITAMIN D 25 HYDROXY (VIT D DEFICIENCY, FRACTURES): VITD: 52.24 ng/mL (ref 30.00–100.00)

## 2017-04-07 NOTE — Patient Instructions (Signed)

## 2017-04-07 NOTE — Progress Notes (Signed)
V HPatient ID: Paula Jensen, female    DOB: 1965-04-22  Age: 52 y.o. MRN: [id number]  The patient is here for  Her annual CPE and management of other chronic and acute problems.  Last PAP normal 2015 Mammogram normal last week Cologuard was normal in  2017   The risk factors are reflected in the social history.  The roster of all physicians providing medical care to patient - is listed in the Snapshot section of the chart.  Activities of daily living:  The patient is 100% independent in all ADLs: dressing, toileting, feeding as well as independent mobility  Home safety : The patient has smoke detectors in the home. They wear seatbelts.  There are no firearms at home. There is no violence in the home.   There is no risks for hepatitis, STDs or HIV. There is no   history of blood transfusion. They have no travel history to infectious disease endemic areas of the world.  The patient has seen their dentist in the last six month. They have seen their eye doctor in the last year. They admit to slight hearing difficulty with regard to whispered voices and some television programs.  [name]  They do not  have excessive sun exposure. Discussed the need for sun protection: hats, long sleeves and use of sunscreen if there is significant sun exposure.   Diet: the importance of a healthy diet is discussed. She is following the KETO diet due to a weight gain of 16 lbs since July and has lost 4 thus far .  Her personal best was 162 lbs in 2014 (BMI was < 25)    The benefits of regular aerobic exercise were discussed. She is exercising using Pilates, Yoga and walking 5 days per week .   Depression screen: there are no signs or vegative symptoms of depression- irritability, change in appetite, anhedonia, sadness/tearfullness.  Cognitive assessment: the patient manages all their financial and personal affairs and is actively engaged. They could relate  day,date,year and events; recalled 2/3 objects at 3 minutes; performed clock-face test normally.  The following portions of the patient's history were reviewed and updated as appropriate: allergies, current medications, past family history, past medical history,  past surgical history, past social history  and problem list.  Visual acuity was not assessed per patient preference since she has regular follow up with her ophthalmologist. Hearing and body mass index were assessed and reviewed.   During the course of the visit the patient was educated and counseled about appropriate screening and preventive services including : fall prevention , diabetes screening, nutrition counseling, colorectal cancer screening, and recommended immunizations.    CC: The primary encounter diagnosis was Screening for cervical cancer. Diagnoses of Foot pain, left, Fatigue, unspecified type, Vitamin D deficiency, Cervical cancer screening, Encounter for preventive health examination, and Overweight (BMI 25.0-29.9) were also pertinent to this visit.  LEFT FOREFOOT PAIN and swelling,  WORSE AT THE END OF THE DAY   Present for several months.  No history of trauma.  Possibility of stress fracture given her history of exercise but does not run or jump barefoot.   X RAYS ORDERED  Has been following the KETO DIET SINCE JAN 2..  NO ALOCHOL SINCE THEN ,  FRUSTRATED AT WT LOSS OF ONLY 4 LBS  Followed by a plateau  History Paula Jensen has a past medical history of allergic rhinitis, Cancer ([place]), and GERD (gastroesophageal reflux disease).  She has a past surgical history that includes Dilation and curettage of uterus (June 2012); Tonsillectomy and adenoidectomy; and laparoscopy (1996).   Her family history includes Heart disease in her father.She reports that  has never smoked. she has never used smokeless tobacco. She reports that she drinks about 3.6 oz of alcohol per week. She reports that she does not use drugs.  Outpatient  Medications Prior to Visit  Medication Sig Dispense Refill  . ALPRAZolam (XANAX) 0.25 MG tablet TAKE 1 TABLET BY MOUTH ONCE DAILY AT BEDTIME AS NEEDED 30 tablet 5  . ANUCORT-HC 25 MG suppository UNWRAP AND INSERT 1 SUPPOSITORY RECTALLY 2 TIMES DAILY. 12 suppository 0  . citalopram (CELEXA) 20 MG tablet TAKE 1 TABLET BY MOUTH ONCE DAILY 30 tablet 2  . clindamycin (CLINDAGEL) 1 % gel Apply topically 2 (two) times daily. 30 g 5  . Doxycycline Hyclate 50 MG TABS Take 1 tablet by mouth daily. 30 tablet 11  . levonorgestrel-ethinyl estradiol (SEASONALE,INTROVALE,JOLESSA) 0.15-0.03 MG tablet TAKE 1 TABLET BY MOUTH DAILY 91 tablet 3  . Magnesium 500 MG CAPS Take 2 capsules by mouth at bedtime.    Marland Kitchen zolpidem (AMBIEN) 5 MG tablet TAKE 1 TABLET BY MOUTH ONCE DAILY AT BEDTIME AS NEEDED 30 tablet 1   No facility-administered medications prior to visit.     Review of Systems   Patient denies headache, fevers, malaise, unintentional weight loss, skin rash, eye pain, sinus congestion and sinus pain, sore throat, dysphagia,  hemoptysis , cough, dyspnea, wheezing, chest pain, palpitations, orthopnea, edema, abdominal pain, nausea, melena, diarrhea, constipation, flank pain, dysuria, hematuria, urinary  Frequency, nocturia, numbness, tingling, seizures,  Focal weakness, Loss of consciousness,  Tremor, insomnia, depression, anxiety, and suicidal ideation.      Objective:  BP 108/70 (BP Location: Left Arm, Patient Position: Sitting, Cuff Size: Normal)   Pulse 87   Temp 98.3 F (36.8 C) (Oral)   Resp 15   Ht 5' 7.25" (1.708 m)   Wt 187 lb 6.4 oz (85 kg)   SpO2 97%   BMI 29.13 kg/m   Physical Exam   General Appearance:    Alert, cooperative, no distress, appears stated age  Head:    Normocephalic, without obvious abnormality, atraumatic  Eyes:    PERRL, conjunctiva/corneas clear, EOM's intact, fundi    benign, both eyes  Ears:    Normal TM's and external ear canals, both ears  Nose:   Nares normal,  septum midline, mucosa normal, no drainage    or sinus tenderness  Throat:   Lips, mucosa, and tongue normal; teeth and gums normal  Neck:   Supple, symmetrical, trachea midline, no adenopathy;    thyroid:  no enlargement/tenderness/nodules; no carotid   bruit or JVD  Back:     Symmetric, no curvature, ROM normal, no CVA tenderness  Lungs:     Clear to auscultation bilaterally, respirations unlabored  Chest Wall:    No tenderness or deformity   Heart:    Regular rate and rhythm, S1 and S2 normal, no murmur, rub   or gallop  Breast Exam:    No tenderness, masses, or nipple abnormality  Abdomen:     Soft, non-tender, bowel sounds active all four quadrants,    no masses, no organomegaly  Genitalia:    Pelvic: cervix normal in appearance, external genitalia normal, no adnexal masses or tenderness, no cervical motion tenderness, rectovaginal septum normal, uterus normal size, shape, and consistency and vagina normal without discharge  Extremities:  Extremities  atraumatic, no cyanosis. Mild swelling of left forefoot without warmth or redness.  No pain with direct pressure   Pulses:   2+ and symmetric all extremities  Skin:   Skin color, texture, turgor normal, no rashes or lesions  Lymph nodes:   Cervical, supraclavicular, and axillary nodes normal  Neurologic:   CNII-XII intact, normal strength, sensation and reflexes    throughout      Assessment & Plan:   Problem List Items Addressed This Visit    Encounter for preventive health examination    Annual comprehensive preventive exam was done as well as an evaluation and management of chronic conditions .  During the course of the visit the patient was educated and counseled about appropriate screening and preventive services including :  diabetes screening, lipid analysis with projected  10 year  risk for CAD , nutrition counseling, breast, cervical and colorectal cancer screening, and recommended immunizations.  Printed recommendations for  health maintenance screenings was give      Foot pain, left    No stress fractures seen.  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  meloxicam offered.       Relevant Orders   DG Foot Complete Left (Completed)   Overweight (BMI 25.0-29.9)    I have addressed her BMI .  She is following a low glycemic index diet  And  Exercisin.  Recommending modifying her routine to include 30 minutes of aerobic exercise a minimum of 5 days per week. Screening for  thyroid and diabetes was done today.          Other Visit Diagnoses    Screening for cervical cancer    -  Primary   Fatigue, unspecified type       Relevant Orders   TSH (Completed)   Comprehensive metabolic panel (Completed)   B12 (Completed)   CBC with Differential/Platelet (Completed)   HIV antibody (Completed)   Vitamin D deficiency       Relevant Orders   VITAMIN D 25 Hydroxy (Vit-D Deficiency, Fractures) (Completed)   Cervical cancer screening       Relevant Orders   Cytology - PAP      I am having Paula Jensen "Cecille Rubin" maintain her Doxycycline Hyclate, clindamycin, ALPRAZolam, ANUCORT-HC, levonorgestrel-ethinyl estradiol, zolpidem, citalopram, and Magnesium.  No orders of the defined types were placed in this encounter.   There are no discontinued medications.  Follow-up: No Follow-up on file.   Crecencio Mc, MD

## 2017-04-08 ENCOUNTER — Encounter: Payer: Self-pay | Admitting: Internal Medicine

## 2017-04-08 ENCOUNTER — Other Ambulatory Visit: Payer: Self-pay | Admitting: Internal Medicine

## 2017-04-08 DIAGNOSIS — M79672 Pain in left foot: Secondary | ICD-10-CM | POA: Insufficient documentation

## 2017-04-08 LAB — HIV ANTIBODY (ROUTINE TESTING W REFLEX): HIV 1&2 Ab, 4th Generation: NONREACTIVE

## 2017-04-08 MED ORDER — MELOXICAM 15 MG PO TABS: 15.0000 mg | ORAL_TABLET | Freq: Every day | ORAL | 0 refills | Status: DC

## 2017-04-08 NOTE — Assessment & Plan Note (Signed)
I have addressed her BMI .  She is following a low glycemic index diet  And  Exercisin.  Recommending modifying her routine to include 30 minutes of aerobic exercise a minimum of 5 days per week. Screening for  thyroid and diabetes was done today.

## 2017-04-08 NOTE — Assessment & Plan Note (Signed)
Annual comprehensive preventive exam was done as well as an evaluation and management of chronic conditions .  During the course of the visit the patient was educated and counseled about appropriate screening and preventive services including :  diabetes screening, lipid analysis with projected  10 year  risk for CAD , nutrition counseling, breast, cervical and colorectal cancer screening, and recommended immunizations.  Printed recommendations for health maintenance screenings was give 

## 2017-04-08 NOTE — Assessment & Plan Note (Signed)
No stress fractures seen.  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  meloxicam offered.

## 2017-04-11 ENCOUNTER — Encounter: Payer: Self-pay | Admitting: Internal Medicine

## 2017-04-11 LAB — CYTOLOGY - PAP
Diagnosis: NEGATIVE
HPV: NOT DETECTED

## 2017-04-14 ENCOUNTER — Other Ambulatory Visit: Payer: Self-pay | Admitting: Internal Medicine

## 2017-04-14 ENCOUNTER — Ambulatory Visit: Payer: Self-pay | Admitting: Psychiatry

## 2017-04-14 ENCOUNTER — Encounter: Payer: Self-pay | Admitting: Internal Medicine

## 2017-04-14 ENCOUNTER — Encounter: Payer: Self-pay | Admitting: Psychiatry

## 2017-04-14 VITALS — BP 110/78 | HR 88 | Temp 98.2°F | Wt 182.0 lb

## 2017-04-14 DIAGNOSIS — H9312 Tinnitus, left ear: Secondary | ICD-10-CM

## 2017-04-14 DIAGNOSIS — H9192 Unspecified hearing loss, left ear: Secondary | ICD-10-CM

## 2017-04-14 NOTE — Patient Instructions (Signed)
Tinnitus Tinnitus refers to hearing a sound when there is no actual source for that sound. This is often described as ringing in the ears. However, people with this condition may hear a variety of noises. A person may hear the sound in one ear or in both ears. The sounds of tinnitus can be soft, loud, or somewhere in between. Tinnitus can last for a few seconds or can be constant for days. It may go away without treatment and come back at various times. When tinnitus is constant or happens often, it can lead to other problems, such as trouble sleeping and trouble concentrating. Almost everyone experiences tinnitus at some point. Tinnitus that is long-lasting (chronic) or comes back often is a problem that may require medical attention. What are the causes? The cause of tinnitus is often not known. In some cases, it can result from other problems or conditions, including:  Exposure to loud noises from machinery, music, or other sources.  Hearing loss.  Ear or sinus infections.  Earwax buildup.  A foreign object in the ear.  Use of certain medicines.  Use of alcohol and caffeine.  High blood pressure.  Heart diseases.  Anemia.  Allergies.  Meniere disease.  Thyroid problems.  Tumors.  An enlarged part of a weakened blood vessel (aneurysm).  What are the signs or symptoms? The main symptom of tinnitus is hearing a sound when there is no source for that sound. It may sound like:  Buzzing.  Roaring.  Ringing.  Blowing air, similar to the sound heard when you listen to a seashell.  Hissing.  Whistling.  Sizzling.  Humming.  Running water.  A sustained musical note.  How is this diagnosed? Tinnitus is diagnosed based on your symptoms. Your health care provider will do a physical exam. A comprehensive hearing exam (audiologic exam) will be done if your tinnitus:  Affects only one ear (unilateral).  Causes hearing difficulties.  Lasts 6 months or  longer.  You may also need to see a health care provider who specializes in hearing disorders (audiologist). You may be asked to complete a questionnaire to determine the severity of your tinnitus. Tests may be done to help determine the cause and to rule out other conditions. These can include:  Imaging studies of your head and brain, such as: ? A CT scan. ? An MRI.  An imaging study of your blood vessels (angiogram).  How is this treated? Treating an underlying medical condition can sometimes make tinnitus go away. If your tinnitus continues, other treatments may include:  Medicines, such as certain antidepressants or sleeping aids.  Sound generators to mask the tinnitus. These include: ? Tabletop sound machines that play relaxing sounds to help you fall asleep. ? Wearable devices that fit in your ear and play sounds or music. ? A small device that uses headphones to deliver a signal embedded in music (acoustic neural stimulation). In time, this may change the pathways of your brain and make you less sensitive to tinnitus. This device is used for very severe cases when no other treatment is working.  Therapy and counseling to help you manage the stress of living with tinnitus.  Using hearing aids or cochlear implants, if your tinnitus is related to hearing loss.  Follow these instructions at home:  When possible, avoid being in loud places and being exposed to loud sounds.  Wear hearing protection, such as earplugs, when you are exposed to loud noises.  Do not take stimulants, such as nicotine,   alcohol, or caffeine.  Practice techniques for reducing stress, such as meditation, yoga, or deep breathing.  Use a white noise machine, a humidifier, or other devices to mask the sound of tinnitus.  Sleep with your head slightly raised. This may reduce the impact of tinnitus.  Try to get plenty of rest each night. Contact a health care provider if:  You have tinnitus in just one  ear.  Your tinnitus continues for 3 weeks or longer without stopping.  Home care measures are not helping.  You have tinnitus after a head injury.  You have tinnitus along with any of the following: ? Dizziness. ? Loss of balance. ? Nausea and vomiting. This information is not intended to replace advice given to you by your health care provider. Make sure you discuss any questions you have with your health care provider. Document Released: 03/07/2005 Document Revised: 11/08/2015 Document Reviewed: 08/07/2013 Elsevier Interactive Patient Education  2018 [employer] Inc[employer]  

## 2017-04-14 NOTE — Progress Notes (Signed)
ENT referral in progress for new onset tinnitus and hearing loss left ear

## 2017-04-14 NOTE — Progress Notes (Signed)
  CHIEF COMPLAINT:  Chief Complaint  Patient presents with  . save-[place]-ringing in ear(left)-dizzy-HA x4d    Subjective  Paula Jensen is a 52 y.o female who presents to the clinic today with complaints of 4 days high pitch ringing to her left ear. She described the ringing as high pitched and constant. She denies any recent travel, swimming or change in medications. She stated that the only thing that has changed recently is her diet. She has been on KETO diet for the past three weeks and have also increased her exercise regimen. She denies any previous history of head trauma. Associated symptoms included occasional headache and dizziness. Treatment tried to date includes sudafed and acetaminophen without any relief.    Objective   Examination:  General exam: Appears calm and comfortable  Respiratory system: Clear to auscultation. Respiratory effort normal. HEENT: [place]/AT, PERRLA, no thrush, no stridor. Significant amount of ear wax to the right ear. Cardiovascular system: S1 & S2 heard, RRR. No JVD, murmurs, rubs, gallops or clicks. No pedal edema. Central nervous system: Alert and oriented. No focal neurological deficits. Psychiatry: Judgement and insight appear normal. Mood & affect appropriate.  Hearing test is positive for decrease low frequency hearing to the left ear.  VITALS:  weight is 182 lb (82.6 kg). Her temperature is 98.2 F (36.8 C). Her blood pressure is 110/78 and her pulse is 88. Her oxygen saturation is 96%.    Radiology Reports No results found.     Assessment/Plan:  Paula Jensen was seen today for save-[place]-ringing in ear(left)-dizzy-ha x4d.  Diagnoses and all orders for this visit:  Tinnitus, left  Patient agrees to follow up with her PCP for referral to see ENT specialist.  Paula Jensen

## 2017-04-27 ENCOUNTER — Other Ambulatory Visit: Payer: Self-pay | Admitting: Internal Medicine

## 2017-04-27 NOTE — Telephone Encounter (Signed)
Refilled: 03/01/2017 Last OV: 04/07/2017 Next OV: 10/06/2017

## 2017-05-01 ENCOUNTER — Other Ambulatory Visit: Payer: Self-pay | Admitting: Internal Medicine

## 2017-05-02 NOTE — Telephone Encounter (Signed)
Refilled: 10/26/2016 Last OV: 04/07/2017 Next OV: 10/06/2017

## 2017-05-03 DIAGNOSIS — H903 Sensorineural hearing loss, bilateral: Secondary | ICD-10-CM | POA: Diagnosis not present

## 2017-05-03 DIAGNOSIS — H6980 Other specified disorders of Eustachian tube, unspecified ear: Secondary | ICD-10-CM | POA: Diagnosis not present

## 2017-05-03 NOTE — Telephone Encounter (Signed)
Printed, signed and faxed.  

## 2017-05-24 DIAGNOSIS — H903 Sensorineural hearing loss, bilateral: Secondary | ICD-10-CM | POA: Diagnosis not present

## 2017-05-24 DIAGNOSIS — H6980 Other specified disorders of Eustachian tube, unspecified ear: Secondary | ICD-10-CM | POA: Diagnosis not present

## 2017-06-14 ENCOUNTER — Encounter: Payer: Self-pay | Admitting: Internal Medicine

## 2017-06-26 ENCOUNTER — Other Ambulatory Visit: Payer: Self-pay | Admitting: Internal Medicine

## 2017-06-26 ENCOUNTER — Encounter: Payer: Self-pay | Admitting: Internal Medicine

## 2017-06-26 MED ORDER — ESCITALOPRAM OXALATE 10 MG PO TABS: 10.0000 mg | ORAL_TABLET | Freq: Every day | ORAL | 2 refills | Status: DC

## 2017-06-30 ENCOUNTER — Other Ambulatory Visit: Payer: Self-pay | Admitting: Internal Medicine

## 2017-06-30 DIAGNOSIS — H524 Presbyopia: Secondary | ICD-10-CM | POA: Diagnosis not present

## 2017-06-30 NOTE — Telephone Encounter (Signed)
Last OV 04/07/17 last refill on Zolpidem 2/18 for 30 with 1 refill ok to fill?

## 2017-07-12 ENCOUNTER — Other Ambulatory Visit: Payer: Self-pay | Admitting: Internal Medicine

## 2017-07-12 ENCOUNTER — Encounter: Payer: Self-pay | Admitting: Internal Medicine

## 2017-07-12 MED ORDER — ESCITALOPRAM OXALATE 20 MG PO TABS: 20.0000 mg | ORAL_TABLET | Freq: Every day | ORAL | 5 refills | Status: DC

## 2017-08-18 ENCOUNTER — Encounter: Payer: Self-pay | Admitting: Internal Medicine

## 2017-08-19 ENCOUNTER — Other Ambulatory Visit: Payer: Self-pay | Admitting: Internal Medicine

## 2017-08-19 MED ORDER — PREDNISONE 10 MG PO TABS: ORAL_TABLET | ORAL | 0 refills | Status: DC

## 2017-09-05 ENCOUNTER — Other Ambulatory Visit: Payer: Self-pay | Admitting: Internal Medicine

## 2017-09-06 ENCOUNTER — Encounter: Payer: Self-pay | Admitting: Internal Medicine

## 2017-09-06 NOTE — Telephone Encounter (Signed)
Refilled: 06/30/2017 Last OV: 04/07/2017 Next OV: 04/13/2018

## 2017-09-06 NOTE — Telephone Encounter (Signed)
Printed, signed and faxed.  

## 2017-09-11 ENCOUNTER — Ambulatory Visit (INDEPENDENT_AMBULATORY_CARE_PROVIDER_SITE_OTHER): Payer: 59 | Admitting: Internal Medicine

## 2017-09-11 ENCOUNTER — Encounter: Payer: Self-pay | Admitting: Internal Medicine

## 2017-09-11 DIAGNOSIS — N951 Menopausal and female climacteric states: Secondary | ICD-10-CM | POA: Diagnosis not present

## 2017-09-11 DIAGNOSIS — E663 Overweight: Secondary | ICD-10-CM | POA: Diagnosis not present

## 2017-09-11 MED ORDER — VENLAFAXINE HCL ER 37.5 MG PO CP24: 37.5000 mg | ORAL_CAPSULE | Freq: Every day | ORAL | 1 refills | Status: DC

## 2017-09-11 NOTE — Patient Instructions (Addendum)
Congratulations on the weight loss!   Continue lexapro for the first two weeks of initiating effexor  Start effexor at one tablet daily  For the first 2 weeks  Then increase to 2 tablets daily at Week 3 and stop the lexapro

## 2017-09-11 NOTE — Progress Notes (Signed)
Subjective:  Patient ID: Paula Jensen, female    DOB: 06-Feb-1966  Age: 52 y.o. MRN: [id number]  CC: Diagnoses of Overweight (BMI 25.0-29.9) and Perimenopause were pertinent to this visit.  HPI Gabrella ESPERANSA Jensen presents for follow up on GAD managed with lexapro and alprazolam, insomnia managed with [name]   Has lost 22 lbs doing low carb,dietn and participating in power yoga   Perimenopause : Jan 22 last period.  Off birth control due to erratic bleeding   Taking lexapro for hot flashes.  No libido, no interest.  Can't climax.   Outpatient Medications Prior to Visit  Medication Sig Dispense Refill  . ALPRAZolam (XANAX) 0.25 MG tablet TAKE 1 TABLET BY MOUTH ONCE DAILY AT BEDTIME AS NEEDED 30 tablet 5  . escitalopram (LEXAPRO) 20 MG tablet Take 1 tablet (20 mg total) by mouth daily. 30 tablet 5  . Magnesium 500 MG CAPS Take 2 capsules by mouth at bedtime.    . meloxicam (MOBIC) 15 MG tablet Take 1 tablet (15 mg total) by mouth daily. 30 tablet 0  . zolpidem (AMBIEN) 5 MG tablet TAKE 1 TABLET BY MOUTH AT BEDTIME AS NEEDED 30 tablet 1  . clindamycin (CLINDAGEL) 1 % gel Apply topically 2 (two) times daily. (Patient not taking: Reported on 09/11/2017) 30 g 5  . Doxycycline Hyclate 50 MG TABS Take 1 tablet by mouth daily. (Patient not taking: Reported on 09/11/2017) 30 tablet 11  . ANUCORT-HC 25 MG suppository UNWRAP AND INSERT 1 SUPPOSITORY RECTALLY 2 TIMES DAILY. (Patient not taking: Reported on 04/14/2017) 12 suppository 0  . levonorgestrel-ethinyl estradiol (SEASONALE,INTROVALE,JOLESSA) 0.15-0.03 MG tablet TAKE 1 TABLET BY MOUTH DAILY (Patient not taking: Reported on 09/11/2017) 91 tablet 3  . predniSONE (DELTASONE) 10 MG tablet 6 tablets on Day 1 , then reduce by 1 tablet daily until gone (Patient not taking: Reported on 09/11/2017) 21 tablet 0   No facility-administered medications prior to visit.     Review of Systems;  Patient denies headache, fevers, malaise, unintentional  weight loss, skin rash, eye pain, sinus congestion and sinus pain, sore throat, dysphagia,  hemoptysis , cough, dyspnea, wheezing, chest pain, palpitations, orthopnea, edema, abdominal pain, nausea, melena, diarrhea, constipation, flank pain, dysuria, hematuria, urinary  Frequency, nocturia, numbness, tingling, seizures,  Focal weakness, Loss of consciousness,  Tremor, insomnia, depression, anxiety, and suicidal ideation.      Objective:  BP 130/90 (BP Location: Left Arm, Patient Position: Sitting, Cuff Size: Normal)   Pulse 75   Temp 98.2 F (36.8 C) (Oral)   Resp 14   Ht 5' 7.25" (1.708 m)   Wt 168 lb (76.2 kg)   SpO2 97%   BMI 26.12 kg/m   BP Readings from Last 3 Encounters:  09/11/17 130/90  04/14/17 110/78  04/07/17 108/70    Wt Readings from Last 3 Encounters:  09/11/17 168 lb (76.2 kg)  04/14/17 182 lb (82.6 kg)  04/07/17 187 lb 6.4 oz (85 kg)    General appearance: alert, cooperative and appears stated age Ears: normal TM's and external ear canals both ears Throat: lips, mucosa, and tongue normal; teeth and gums normal Neck: no adenopathy, no carotid bruit, supple, symmetrical, trachea midline and thyroid not enlarged, symmetric, no tenderness/mass/nodules Back: symmetric, no curvature. ROM normal. No CVA tenderness. Lungs: clear to auscultation bilaterally Heart: regular rate and rhythm, S1, S2 normal, no murmur, click, rub or gallop Abdomen: soft, non-tender; bowel sounds normal; no masses,  no organomegaly Pulses: 2+ and symmetric Skin: Skin  color, texture, turgor normal. No rashes or lesions Lymph nodes: Cervical, supraclavicular, and axillary nodes normal.  No results found for: HGBA1C  Lab Results  Component Value Date   CREATININE 0.78 04/07/2017   CREATININE 0.82 12/11/2015   CREATININE 0.82 01/09/2015    Lab Results  Component Value Date   WBC 4.3 04/07/2017   HGB 13.7 04/07/2017   HCT 41.4 04/07/2017   PLT 279.0 04/07/2017   GLUCOSE 90  04/07/2017   CHOL 130 12/11/2015   TRIG 52.0 12/11/2015   HDL 67.00 12/11/2015   LDLDIRECT 107.1 03/28/2013   LDLCALC 53 12/11/2015   ALT 9 04/07/2017   AST 13 04/07/2017   NA 139 04/07/2017   K 3.8 04/07/2017   CL 103 04/07/2017   CREATININE 0.78 04/07/2017   BUN 15 04/07/2017   CO2 30 04/07/2017   TSH 1.35 04/07/2017    Dg Foot Complete Left  Result Date: 04/07/2017 CLINICAL DATA:  Pain EXAM: LEFT FOOT - COMPLETE 3+ VIEW COMPARISON:  None. FINDINGS: Frontal, oblique, and lateral views were obtained. No fracture or dislocation. There is moderate osteoarthritic change in the first MTP joint. Other joint spaces appear unremarkable. No erosive change. There is a small bone island in the distal aspect of the second distal phalanx. IMPRESSION: Moderate osteoarthritic change first MTP joint. Other joint spaces appear unremarkable. No fracture or dislocation. Electronically Signed   By: Lowella Grip III M.D.   On: 04/07/2017 12:58    Assessment & Plan:   Problem List Items Addressed This Visit    Overweight (BMI 25.0-29.9)    I have congratulated her in reduction of   BMI and encouraged  Continued weight loss with goal of 10% of body weight over the next 6 months using a low glycemic index diet and regular exercise a minimum of 5 days per week.        Perimenopause    Last menses Jan 2019.  .transitioning from lexapro to effexor for minimal  Effects on libido,  Weight          I have discontinued Ailana G. Toohey "Lori"'s ANUCORT-HC, levonorgestrel-ethinyl estradiol, and predniSONE. I am also having her start on venlafaxine XR. Additionally, I am having her maintain her Doxycycline Hyclate, clindamycin, Magnesium, meloxicam, ALPRAZolam, escitalopram, and zolpidem.  Meds ordered this encounter  Medications  . venlafaxine XR (EFFEXOR-XR) 37.5 MG 24 hr capsule    Sig: Take 1 capsule (37.5 mg total) by mouth daily with breakfast.    Dispense:  90 capsule    Refill:  1     Medications Discontinued During This Encounter  Medication Reason  . ANUCORT-HC 25 MG suppository Prescription never filled  . levonorgestrel-ethinyl estradiol (SEASONALE,INTROVALE,JOLESSA) 0.15-0.03 MG tablet Patient has not taken in last 30 days  . predniSONE (DELTASONE) 10 MG tablet Prescription never filled    Follow-up: No follow-ups on file.   Crecencio Mc, MD

## 2017-09-12 DIAGNOSIS — N951 Menopausal and female climacteric states: Secondary | ICD-10-CM | POA: Insufficient documentation

## 2017-09-12 NOTE — Assessment & Plan Note (Signed)
I have congratulated her in reduction of   BMI and encouraged  Continued weight loss with goal of 10% of body weight over the next 6 months using a low glycemic index diet and regular exercise a minimum of 5 days per week.     

## 2017-09-12 NOTE — Assessment & Plan Note (Signed)
Last menses Jan 2019.  .transitioning from lexapro to effexor for minimal  Effects on libido,  Weight

## 2017-10-06 ENCOUNTER — Ambulatory Visit: Payer: Self-pay | Admitting: Internal Medicine

## 2017-10-23 ENCOUNTER — Encounter: Payer: Self-pay | Admitting: Internal Medicine

## 2017-10-24 MED ORDER — VENLAFAXINE HCL ER 37.5 MG PO CP24: 112.5000 mg | ORAL_CAPSULE | Freq: Every day | ORAL | 5 refills | Status: DC

## 2017-11-01 ENCOUNTER — Other Ambulatory Visit: Payer: Self-pay | Admitting: Internal Medicine

## 2017-11-01 NOTE — Telephone Encounter (Signed)
Refilled: 09/06/2017 Last OV: 09/11/2017 Next OV: 04/13/2018

## 2017-11-21 ENCOUNTER — Other Ambulatory Visit: Payer: Self-pay | Admitting: Internal Medicine

## 2017-11-21 NOTE — Telephone Encounter (Signed)
Ok to refill this? 

## 2018-01-03 ENCOUNTER — Other Ambulatory Visit: Payer: Self-pay | Admitting: Internal Medicine

## 2018-01-05 NOTE — Telephone Encounter (Signed)
Refilled: 05/02/2017 Last Ov: 09/11/2017 Next OV: 04/13/2018

## 2018-01-11 ENCOUNTER — Other Ambulatory Visit: Payer: Self-pay | Admitting: Internal Medicine

## 2018-01-12 NOTE — Telephone Encounter (Signed)
Printed and signed.  

## 2018-01-12 NOTE — Telephone Encounter (Signed)
Refilled: 11/01/2017 Last OV: 09/11/2017 Next OV: 04/13/2018

## 2018-01-16 NOTE — Telephone Encounter (Signed)
Script faxed to [facility]

## 2018-04-13 ENCOUNTER — Other Ambulatory Visit: Payer: Self-pay | Admitting: Internal Medicine

## 2018-04-13 ENCOUNTER — Ambulatory Visit (INDEPENDENT_AMBULATORY_CARE_PROVIDER_SITE_OTHER): Payer: No Typology Code available for payment source | Admitting: Internal Medicine

## 2018-04-13 ENCOUNTER — Encounter: Payer: Self-pay | Admitting: Internal Medicine

## 2018-04-13 VITALS — BP 120/90 | HR 86 | Temp 98.3°F | Resp 14 | Ht 67.25 in | Wt 170.2 lb

## 2018-04-13 DIAGNOSIS — F5101 Primary insomnia: Secondary | ICD-10-CM

## 2018-04-13 DIAGNOSIS — Z630 Problems in relationship with spouse or partner: Secondary | ICD-10-CM

## 2018-04-13 DIAGNOSIS — E663 Overweight: Secondary | ICD-10-CM

## 2018-04-13 DIAGNOSIS — Z0000 Encounter for general adult medical examination without abnormal findings: Secondary | ICD-10-CM | POA: Diagnosis not present

## 2018-04-13 DIAGNOSIS — F411 Generalized anxiety disorder: Secondary | ICD-10-CM

## 2018-04-13 DIAGNOSIS — N951 Menopausal and female climacteric states: Secondary | ICD-10-CM

## 2018-04-13 LAB — COMPREHENSIVE METABOLIC PANEL
ALBUMIN: 4.4 g/dL (ref 3.5–5.2)
ALT: 16 U/L (ref 0–35)
AST: 20 U/L (ref 0–37)
Alkaline Phosphatase: 147 U/L — ABNORMAL HIGH (ref 39–117)
BUN: 16 mg/dL (ref 6–23)
CALCIUM: 9.3 mg/dL (ref 8.4–10.5)
CO2: 29 meq/L (ref 19–32)
Chloride: 101 mEq/L (ref 96–112)
Creatinine, Ser: 0.76 mg/dL (ref 0.40–1.20)
GFR: 79.35 mL/min (ref >60.00)
Glucose, Bld: 99 mg/dL (ref 70–99)
Potassium: 4.3 mEq/L (ref 3.5–5.1)
Sodium: 139 mEq/L (ref 135–145)
Total Bilirubin: 0.5 mg/dL (ref 0.2–1.2)
Total Protein: 6.8 g/dL (ref 6.0–8.3)

## 2018-04-13 LAB — LIPID PANEL
CHOL/HDL RATIO: 2
CHOLESTEROL: 198 mg/dL (ref 0–200)
HDL: 93.4 mg/dL (ref >39.00)
LDL CALC: 91 mg/dL (ref 0–99)
NonHDL: 104.65
TRIGLYCERIDES: 66 mg/dL (ref 0.0–149.0)
VLDL: 13.2 mg/dL (ref 0.0–40.0)

## 2018-04-13 MED ORDER — ALPRAZOLAM 0.25 MG PO TABS: 0.2500 mg | ORAL_TABLET | Freq: Every evening | ORAL | 1 refills | Status: DC | PRN

## 2018-04-13 MED ORDER — NAPROXEN 500 MG PO TBEC: 500.0000 mg | DELAYED_RELEASE_TABLET | Freq: Two times a day (BID) | ORAL | 1 refills | Status: DC

## 2018-04-13 MED ORDER — DOXYCYCLINE MONOHYDRATE 50 MG PO TABS: 50.0000 mg | ORAL_TABLET | Freq: Every day | ORAL | 6 refills | Status: DC

## 2018-04-13 MED ORDER — CITALOPRAM HYDROBROMIDE 10 MG PO TABS: 10.0000 mg | ORAL_TABLET | Freq: Every day | ORAL | 0 refills | Status: DC

## 2018-04-13 MED ORDER — ZOLPIDEM TARTRATE 5 MG PO TABS: 5.0000 mg | ORAL_TABLET | Freq: Every evening | ORAL | 5 refills | Status: DC | PRN

## 2018-04-13 MED ORDER — CLINDAMYCIN PHOSPHATE 1 % EX GEL: Freq: Two times a day (BID) | CUTANEOUS | 5 refills | Status: DC

## 2018-04-13 NOTE — Patient Instructions (Addendum)
Start celexa at 10 mg daily in the evening  After one week reduce effexor  to 2 capsules daily  After 2 weeks increases celexa to 2 tablets  daily (20 mg) and reduce effexor  to 1 capsule daily for one week,  Then stop   This will take 56 capsules of effexor  To complete this taper,  We can do it faster if you do not have that many left   Health Maintenance for Postmenopausal Women Menopause is a normal process in which your reproductive ability comes to an end. This process happens gradually over a span of months to years, usually between the ages of 67 and 63. Menopause is complete when you have missed 12 consecutive menstrual periods. It is important to talk with your health care provider about some of the most common conditions that affect postmenopausal women, such as heart disease, cancer, and bone loss (osteoporosis). Adopting a healthy lifestyle and getting preventive care can help to promote your health and wellness. Those actions can also lower your chances of developing some of these common conditions. What should I know about menopause? During menopause, you may experience a number of symptoms, such as:  Moderate-to-severe hot flashes.  Night sweats.  Decrease in sex drive.  Mood swings.  Headaches.  Tiredness.  Irritability.  Memory problems.  Insomnia. Choosing to treat or not to treat menopausal changes is an individual decision that you make with your health care provider. What should I know about hormone replacement therapy and supplements? Hormone therapy products are effective for treating symptoms that are associated with menopause, such as hot flashes and night sweats. Hormone replacement carries certain risks, especially as you become older. If you are thinking about using estrogen or estrogen with progestin treatments, discuss the benefits and risks with your health care provider. What should I know about heart disease and stroke? Heart disease, heart  attack, and stroke become more likely as you age. This may be due, in part, to the hormonal changes that your body experiences during menopause. These can affect how your body processes dietary fats, triglycerides, and cholesterol. Heart attack and stroke are both medical emergencies. There are many things that you can do to help prevent heart disease and stroke:  Have your blood pressure checked at least every 1-2 years. High blood pressure causes heart disease and increases the risk of stroke.  If you are 71-59 years old, ask your health care provider if you should take aspirin to prevent a heart attack or a stroke.  Do not use any tobacco products, including cigarettes, chewing tobacco, or electronic cigarettes. If you need help quitting, ask your health care provider.  It is important to eat a healthy diet and maintain a healthy weight. ? Be sure to include plenty of vegetables, fruits, low-fat dairy products, and lean protein. ? Avoid eating foods that are high in solid fats, added sugars, or salt (sodium).  Get regular exercise. This is one of the most important things that you can do for your health. ? Try to exercise for at least 150 minutes each week. The type of exercise that you do should increase your heart rate and make you sweat. This is known as moderate-intensity exercise. ? Try to do strengthening exercises at least twice each week. Do these in addition to the moderate-intensity exercise.  Know your numbers.Ask your health care provider to check your cholesterol and your blood glucose. Continue to have your blood tested as directed by your health care  provider.  What should I know about cancer screening? There are several types of cancer. Take the following steps to reduce your risk and to catch any cancer development as early as possible. Breast Cancer  Practice breast self-awareness. ? This means understanding how your breasts normally appear and feel. ? It also means  doing regular breast self-exams. Let your health care provider know about any changes, no matter how small.  If you are 44 or older, have a clinician do a breast exam (clinical breast exam or CBE) every year. Depending on your age, family history, and medical history, it may be recommended that you also have a yearly breast X-ray (mammogram).  If you have a family history of breast cancer, talk with your health care provider about genetic screening.  If you are at high risk for breast cancer, talk with your health care provider about having an MRI and a mammogram every year.  Breast cancer (BRCA) gene test is recommended for women who have family members with BRCA-related cancers. Results of the assessment will determine the need for genetic counseling and BRCA1 and for BRCA2 testing. BRCA-related cancers include these types: ? Breast. This occurs in males or females. ? Ovarian. ? Tubal. This may also be called fallopian tube cancer. ? Cancer of the abdominal or pelvic lining (peritoneal cancer). ? Prostate. ? Pancreatic. Cervical, Uterine, and Ovarian Cancer Your health care provider may recommend that you be screened regularly for cancer of the pelvic organs. These include your ovaries, uterus, and vagina. This screening involves a pelvic exam, which includes checking for microscopic changes to the surface of your cervix (Pap test).  For women ages 21-65, health care providers may recommend a pelvic exam and a Pap test every three years. For women ages 22-65, they may recommend the Pap test and pelvic exam, combined with testing for human papilloma virus (HPV), every five years. Some types of HPV increase your risk of cervical cancer. Testing for HPV may also be done on women of any age who have unclear Pap test results.  Other health care providers may not recommend any screening for nonpregnant women who are considered low risk for pelvic cancer and have no symptoms. Ask your health care  provider if a screening pelvic exam is right for you.  If you have had past treatment for cervical cancer or a condition that could lead to cancer, you need Pap tests and screening for cancer for at least 20 years after your treatment. If Pap tests have been discontinued for you, your risk factors (such as having a new sexual partner) need to be reassessed to determine if you should start having screenings again. Some women have medical problems that increase the chance of getting cervical cancer. In these cases, your health care provider may recommend that you have screening and Pap tests more often.  If you have a family history of uterine cancer or ovarian cancer, talk with your health care provider about genetic screening.  If you have vaginal bleeding after reaching menopause, tell your health care provider.  There are currently no reliable tests available to screen for ovarian cancer. Lung Cancer Lung cancer screening is recommended for adults 54-45 years old who are at high risk for lung cancer because of a history of smoking. A yearly low-dose CT scan of the lungs is recommended if you:  Currently smoke.  Have a history of at least 30 pack-years of smoking and you currently smoke or have quit within the past  15 years. A pack-year is smoking an average of one pack of cigarettes per day for one year. Yearly screening should:  Continue until it has been 15 years since you quit.  Stop if you develop a health problem that would prevent you from having lung cancer treatment. Colorectal Cancer  This type of cancer can be detected and can often be prevented.  Routine colorectal cancer screening usually begins at age 41 and continues through age 28.  If you have risk factors for colon cancer, your health care provider may recommend that you be screened at an earlier age.  If you have a family history of colorectal cancer, talk with your health care provider about genetic screening.  Your  health care provider may also recommend using home test kits to check for hidden blood in your stool.  A small camera at the end of a tube can be used to examine your colon directly (sigmoidoscopy or colonoscopy). This is done to check for the earliest forms of colorectal cancer.  Direct examination of the colon should be repeated every 5-10 years until age 28. However, if early forms of precancerous polyps or small growths are found or if you have a family history or genetic risk for colorectal cancer, you may need to be screened more often. Skin Cancer  Check your skin from head to toe regularly.  Monitor any moles. Be sure to tell your health care provider: ? About any new moles or changes in moles, especially if there is a change in a mole's shape or color. ? If you have a mole that is larger than the size of a pencil eraser.  If any of your family members has a history of skin cancer, especially at a young age, talk with your health care provider about genetic screening.  Always use sunscreen. Apply sunscreen liberally and repeatedly throughout the day.  Whenever you are outside, protect yourself by wearing long sleeves, pants, a wide-brimmed hat, and sunglasses. What should I know about osteoporosis? Osteoporosis is a condition in which bone destruction happens more quickly than new bone creation. After menopause, you may be at an increased risk for osteoporosis. To help prevent osteoporosis or the bone fractures that can happen because of osteoporosis, the following is recommended:  If you are 69-55 years old, get at least 1,000 mg of calcium and at least 600 mg of vitamin D per day.  If you are older than age 39 but younger than age 54, get at least 1,200 mg of calcium and at least 600 mg of vitamin D per day.  If you are older than age 33, get at least 1,200 mg of calcium and at least 800 mg of vitamin D per day. Smoking and excessive alcohol intake increase the risk of  osteoporosis. Eat foods that are rich in calcium and vitamin D, and do weight-bearing exercises several times each week as directed by your health care provider. What should I know about how menopause affects my mental health? Depression may occur at any age, but it is more common as you become older. Common symptoms of depression include:  Low or sad mood.  Changes in sleep patterns.  Changes in appetite or eating patterns.  Feeling an overall lack of motivation or enjoyment of activities that you previously enjoyed.  Frequent crying spells. Talk with your health care provider if you think that you are experiencing depression. What should I know about immunizations? It is important that you get and maintain your  immunizations. These include:  Tetanus, diphtheria, and pertussis (Tdap) booster vaccine.  Influenza every year before the flu season begins.  Pneumonia vaccine.  Shingles vaccine. Your health care provider may also recommend other immunizations. This information is not intended to replace advice given to you by your health care provider. Make sure you discuss any questions you have with your health care provider. Document Released: 04/29/2005 Document Revised: 09/25/2015 Document Reviewed: 12/09/2014 Elsevier Interactive Patient Education  2019 [employer].

## 2018-04-13 NOTE — Progress Notes (Signed)
Patient ID: Paula Jensen, female    DOB: 02-14-1966  Age: 53 y.o. MRN: [id number]  The patient is here for annual preventive examination and management of other chronic and acute problems.   The risk factors are reflected in the social history.  The roster of all physicians providing medical care to patient - is listed in the Snapshot section of the chart.  Activities of daily living:  The patient is 100% independent in all ADLs: dressing, toileting, feeding as well as independent mobility  Home safety : The patient has smoke detectors in the home. They wear seatbelts.  There are no firearms at home. There is no violence in the home.   There is no risks for hepatitis, STDs or HIV. There is no   history of blood transfusion. They have no travel history to infectious disease endemic areas of the world.  The patient has seen their dentist in the last six month. They have seen their eye doctor in the last year. They admit to slight hearing difficulty with regard to whispered voices and some television programs.  [name]  They do not  have excessive sun exposure. Discussed the need for sun protection: hats, long sleeves and use of sunscreen if there is significant sun exposure.   Diet: the importance of a healthy diet is discussed. They do have a healthy diet.  The benefits of regular aerobic exercise were discussed. She walks 4 times per week ,  20 minutes.   Depression screen: there are no signs or vegative symptoms of depression- irritability, change in appetite, anhedonia, sadness/tearfullness.   The following portions of the patient's history were reviewed and updated as appropriate: allergies, current medications, past family history, past medical history,  past surgical history, past social history  and problem list.  Visual acuity was not assessed per patient preference since she has regular follow up with her ophthalmologist. Hearing  and body mass index were assessed and reviewed.   During the course of the visit the patient was educated and counseled about appropriate screening and preventive services including : fall prevention , diabetes screening, nutrition counseling, colorectal cancer screening, and recommended immunizations.    CC: The primary encounter diagnosis was Encounter for preventive health examination. Diagnoses of Overweight (BMI 25.0-29.9), Anxiety, generalized, Primary insomnia, Marital conflict, and Menopausal state were also pertinent to this visit.  1) menopause: Taking Effexor but has had intolerable neurologic side effects due to recent  lapse .  She is requesting a change to Celexa     2) marital conflict : wants to see a marriage counsellor   History Paula Jensen has a past medical history of allergic rhinitis, Cancer ([place]), and GERD (gastroesophageal reflux disease).   She has a past surgical history that includes Dilation and curettage of uterus (June 2012); Tonsillectomy and adenoidectomy; and laparoscopy (1996).   Her family history includes Heart disease in her father.She reports that she has never smoked. She has never used smokeless tobacco. She reports current alcohol use of about 6.0 standard drinks of alcohol per week. She reports that she does not use drugs.  Outpatient Medications Prior to Visit  Medication Sig Dispense Refill  . Magnesium 500 MG CAPS Take 2 capsules by mouth at bedtime.    Marland Kitchen venlafaxine XR (EFFEXOR-XR) 37.5 MG 24 hr capsule Take 3 capsules (112.5 mg total) by mouth daily with breakfast. 90 capsule 5  . ALPRAZolam (XANAX) 0.25 MG tablet TAKE ONE TABLET BY  MOUTH AT BEDTIME AS NEEDED 30 tablet 1  . clindamycin (CLINDAGEL) 1 % gel Apply topically 2 (two) times daily. 30 g 5  . doxycycline (ADOXA) 50 MG tablet TAKE 1 TABLET BY MOUTH DAILY. 30 tablet 11  . zolpidem (AMBIEN) 5 MG tablet TAKE 1 TABLET BY MOUTH AT BEDTIME AS NEEDED 30 tablet 2  . escitalopram (LEXAPRO) 20 MG  tablet Take 1 tablet (20 mg total) by mouth daily. (Patient not taking: Reported on 04/13/2018) 30 tablet 5  . meloxicam (MOBIC) 15 MG tablet Take 1 tablet (15 mg total) by mouth daily. (Patient not taking: Reported on 04/13/2018) 30 tablet 0   No facility-administered medications prior to visit.     Review of Systems   Patient denies headache, fevers, malaise, unintentional weight loss, skin rash, eye pain, sinus congestion and sinus pain, sore throat, dysphagia,  hemoptysis , cough, dyspnea, wheezing, chest pain, palpitations, orthopnea, edema, abdominal pain, nausea, melena, diarrhea, constipation, flank pain, dysuria, hematuria, urinary  Frequency, nocturia, numbness, tingling, seizures,  Focal weakness, Loss of consciousness,  Tremor, insomnia, depression, anxiety, and suicidal ideation.      Objective:  BP 120/90 (BP Location: Left Arm, Patient Position: Sitting, Cuff Size: Normal)   Pulse 86   Temp 98.3 F (36.8 C) (Oral)   Resp 14   Ht 5' 7.25" (1.708 m)   Wt 170 lb 3.2 oz (77.2 kg)   SpO2 97%   BMI 26.46 kg/m   Physical Exam   General appearance: alert, cooperative and appears stated age Head: Normocephalic, without obvious abnormality, atraumatic Eyes: conjunctivae/corneas clear. PERRL, EOM's intact. Fundi benign. Ears: normal TM's and external ear canals both ears Nose: Nares normal. Septum midline. Mucosa normal. No drainage or sinus tenderness. Throat: lips, mucosa, and tongue normal; teeth and gums normal Neck: no adenopathy, no carotid bruit, no JVD, supple, symmetrical, trachea midline and thyroid not enlarged, symmetric, no tenderness/mass/nodules Lungs: clear to auscultation bilaterally Breasts: normal appearance, no masses or tenderness Heart: regular rate and rhythm, S1, S2 normal, no murmur, click, rub or gallop Abdomen: soft, non-tender; bowel sounds normal; no masses,  no organomegaly Extremities: extremities normal, atraumatic, no cyanosis or edema Pulses:  2+ and symmetric Skin: Skin color, texture, turgor normal. No rashes or lesions Neurologic: Alert and oriented X 3, normal strength and tone. Normal symmetric reflexes. Normal coordination and gait.      Assessment & Plan:   Problem List Items Addressed This Visit    Anxiety, generalized    Transitioning from effexor to celexa with cross taper planned,  due to unpleasant side  effects that occur with missed dose of effexor.  The risks and benefits of  Chronic  benzodiazepine use were discussed with patient today including increased risk of dementia,  Addiction, and seizures if abruptly withdrawn  .  Patient was encouraged to reduce use of clonazepam gradually,  Starting with reduction of one dose to 1/2 tablet and use buspirone if needed  . ,       Relevant Medications   citalopram (CELEXA) 10 MG tablet   ALPRAZolam (XANAX) 0.25 MG tablet   Encounter for preventive health examination - Primary    Annual comprehensive preventive exam was done as well as an evaluation and management of chronic conditions .  During the course of the visit the patient was educated and counseled about appropriate screening and preventive services including :  diabetes screening, lipid analysis with projected  10 year  risk for CAD , nutrition counseling, breast, cervical  and colorectal cancer screening, and recommended immunizations.  Printed recommendations for health maintenance screenings was given      Relevant Orders   Lipid panel (Completed)   Insomnia    Chronic, with no improvement in prior trials of over-the-counter first generation antihistamines.  Continue alternating ambien 5 mg  with alprazolam 0.25 mg . The risks and benefits of benzodiazepine use were reviewed with patient today including excessive sedation leading to respiratory depression,  impaired thinking/driving, and addiction.  Patient was advised to avoid concurrent use with alcohol, to use medication only as needed and not to share with  others  .        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       Menopausal state    Paula Jensen has been off of OCPS and has been amenorrheic for one year.      Overweight (BMI 25.0-29.9)    I have congratulated her in maintenance of weight loss and encouraged  Continued weight loss with goal of 10% of body weight over the next 6 months using a low glycemic index diet and regular exercise a minimum of 5 days per week.        Relevant Orders   Comprehensive metabolic panel (Completed)      I have discontinued Paula G. Sharlett Iles "Lori"'s meloxicam and escitalopram. I have also changed her doxycycline, zolpidem, and ALPRAZolam. Additionally, I am having her start on naproxen and citalopram. Lastly, I am having her maintain her Magnesium, venlafaxine XR, and clindamycin.  Meds ordered this encounter  Medications  . naproxen (EC NAPROSYN) 500 MG EC tablet    Sig: Take 1 tablet (500 mg total) by mouth 2 (two) times daily with a meal.    Dispense:  60 tablet    Refill:  1  . citalopram (CELEXA) 10 MG tablet    Sig: Take 1 tablet (10 mg total) by mouth daily.    Dispense:  90 tablet    Refill:  0  . clindamycin (CLINDAGEL) 1 % gel    Sig: Apply topically 2 (two) times daily.    Dispense:  30 g    Refill:  5  . doxycycline (ADOXA) 50 MG tablet    Sig: Take 1 tablet (50 mg total) by mouth daily.    Dispense:  30 tablet    Refill:  6  . zolpidem (AMBIEN) 5 MG tablet    Sig: Take 1 tablet (5 mg total) by mouth at bedtime as needed.    Dispense:  30 tablet    Refill:  5  . ALPRAZolam (XANAX) 0.25 MG tablet    Sig: Take 1 tablet (0.25 mg total) by mouth at bedtime as needed.    Dispense:  30 tablet    Refill:  1    Medications Discontinued During This Encounter  Medication Reason  . escitalopram (LEXAPRO) 20 MG tablet   . meloxicam (MOBIC) 15 MG tablet Completed Course  . clindamycin (CLINDAGEL) 1 %  gel Reorder  . doxycycline (ADOXA) 50 MG tablet Reorder  . zolpidem (AMBIEN) 5 MG tablet Reorder  . ALPRAZolam (XANAX) 0.25 MG tablet Reorder    Follow-up: No follow-ups on file.   Crecencio Mc, MD

## 2018-04-15 DIAGNOSIS — N951 Menopausal and female climacteric states: Secondary | ICD-10-CM | POA: Insufficient documentation

## 2018-04-15 DIAGNOSIS — Z630 Problems in relationship with spouse or partner: Secondary | ICD-10-CM | POA: Insufficient documentation

## 2018-04-15 NOTE — Assessment & Plan Note (Signed)
Chronic, with no improvement in prior trials of over-the-counter first generation antihistamines.  Continue alternating ambien 5 mg  with alprazolam 0.25 mg . The risks and benefits of benzodiazepine use were reviewed with patient today including excessive sedation leading to respiratory depression,  impaired thinking/driving, and addiction.  Patient was advised to avoid concurrent use with alcohol, to use medication only as needed and not to share with others  .

## 2018-04-15 NOTE — Assessment & Plan Note (Signed)
I have congratulated her in maintenance of weight loss and encouraged  Continued weight loss with goal of 10% of body weight over the next 6 months using a low glycemic index diet and regular exercise a minimum of 5 days per week.

## 2018-04-15 NOTE — Assessment & Plan Note (Signed)
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

## 2018-04-15 NOTE — Assessment & Plan Note (Signed)
Annual comprehensive preventive exam was done as well as an evaluation and management of chronic conditions .  During the course of the visit the patient was educated and counseled about appropriate screening and preventive services including :  diabetes screening, lipid analysis with projected  10 year  risk for CAD , nutrition counseling, breast, cervical and colorectal cancer screening, and recommended immunizations.  Printed recommendations for health maintenance screenings was given 

## 2018-04-15 NOTE — Assessment & Plan Note (Signed)
Paula Jensen has been off of OCPS and has been amenorrheic for one year.

## 2018-04-15 NOTE — Assessment & Plan Note (Signed)
Transitioning from effexor to celexa with cross taper planned,  due to unpleasant side  effects that occur with missed dose of effexor.  The risks and benefits of  Chronic  benzodiazepine use were discussed with patient today including increased risk of dementia,  Addiction, and seizures if abruptly withdrawn  .  Patient was encouraged to reduce use of clonazepam gradually,  Starting with reduction of one dose to 1/2 tablet and use buspirone if needed  . ,

## 2018-04-16 NOTE — Telephone Encounter (Signed)
refillwd on Friday

## 2018-04-17 ENCOUNTER — Other Ambulatory Visit: Payer: Self-pay | Admitting: Internal Medicine

## 2018-04-17 DIAGNOSIS — R748 Abnormal levels of other serum enzymes: Secondary | ICD-10-CM

## 2018-04-24 DIAGNOSIS — R748 Abnormal levels of other serum enzymes: Secondary | ICD-10-CM

## 2018-04-25 ENCOUNTER — Other Ambulatory Visit: Payer: Self-pay | Admitting: Internal Medicine

## 2018-04-25 DIAGNOSIS — Z1231 Encounter for screening mammogram for malignant neoplasm of breast: Secondary | ICD-10-CM

## 2018-04-26 ENCOUNTER — Other Ambulatory Visit: Payer: Self-pay

## 2018-04-26 DIAGNOSIS — R748 Abnormal levels of other serum enzymes: Secondary | ICD-10-CM

## 2018-04-28 LAB — SPECIMEN STATUS REPORT

## 2018-04-28 LAB — ALKALINE PHOSPHATASE, ISOENZYMES
BONE FRACTION: 29 % (ref 14–68)
INTESTINAL FRAC.: 29 % — ABNORMAL HIGH (ref 0–18)
LIVER FRACTION: 42 % (ref 18–85)

## 2018-04-28 LAB — HEPATIC FUNCTION PANEL
ALT: 20 IU/L (ref 0–32)
AST: 21 IU/L (ref 0–40)
Albumin: 4.6 g/dL (ref 3.8–4.9)
Alkaline Phosphatase: 155 IU/L — ABNORMAL HIGH (ref 39–117)
BILIRUBIN, DIRECT: 0.11 mg/dL (ref 0.00–0.40)
Bilirubin Total: 0.3 mg/dL (ref 0.0–1.2)
Total Protein: 6.9 g/dL (ref 6.0–8.5)

## 2018-04-29 ENCOUNTER — Other Ambulatory Visit: Payer: Self-pay | Admitting: Internal Medicine

## 2018-04-29 ENCOUNTER — Encounter: Payer: Self-pay | Admitting: Internal Medicine

## 2018-04-29 DIAGNOSIS — R748 Abnormal levels of other serum enzymes: Secondary | ICD-10-CM

## 2018-05-02 ENCOUNTER — Ambulatory Visit: Payer: Self-pay

## 2018-05-02 ENCOUNTER — Ambulatory Visit
Admission: RE | Admit: 2018-05-02 | Discharge: 2018-05-02 | Disposition: A | Discharge status: Home / Self Care | Payer: No Typology Code available for payment source | Source: Ambulatory Visit | Attending: Internal Medicine | Admitting: Internal Medicine

## 2018-05-02 DIAGNOSIS — R748 Abnormal levels of other serum enzymes: Secondary | ICD-10-CM | POA: Diagnosis not present

## 2018-05-07 ENCOUNTER — Ambulatory Visit
Admission: RE | Admit: 2018-05-07 | Discharge: 2018-05-07 | Disposition: A | Discharge status: Home / Self Care | Payer: No Typology Code available for payment source | Source: Ambulatory Visit | Attending: Internal Medicine | Admitting: Internal Medicine

## 2018-05-07 DIAGNOSIS — Z1231 Encounter for screening mammogram for malignant neoplasm of breast: Secondary | ICD-10-CM | POA: Diagnosis not present

## 2018-06-01 ENCOUNTER — Other Ambulatory Visit: Payer: Self-pay | Admitting: Internal Medicine

## 2018-06-04 DIAGNOSIS — F411 Generalized anxiety disorder: Secondary | ICD-10-CM

## 2018-06-05 MED ORDER — CITALOPRAM HYDROBROMIDE 20 MG PO TABS: 20.0000 mg | ORAL_TABLET | Freq: Every day | ORAL | 1 refills | Status: DC

## 2018-06-05 NOTE — Assessment & Plan Note (Signed)
Citalopram dose increased to 20 mg daily.  90 day refill sent to [facility].

## 2018-06-13 MED FILL — ALPRAZolam 0.25 MG TABS: 0.25 | 30 days supply | Qty: 30 | Fill #0

## 2018-06-15 ENCOUNTER — Telehealth: Payer: No Typology Code available for payment source | Admitting: Nurse Practitioner

## 2018-06-15 DIAGNOSIS — W57XXXA Bitten or stung by nonvenomous insect and other nonvenomous arthropods, initial encounter: Secondary | ICD-10-CM | POA: Diagnosis not present

## 2018-06-15 DIAGNOSIS — R21 Rash and other nonspecific skin eruption: Secondary | ICD-10-CM | POA: Diagnosis not present

## 2018-06-15 MED ORDER — DOXYCYCLINE HYCLATE 100 MG PO TABS: 100.0000 mg | ORAL_TABLET | Freq: Two times a day (BID) | ORAL | 0 refills | Status: DC

## 2018-06-15 NOTE — Progress Notes (Signed)
Thank you for describing your tick bite, Here is how we plan to help! Based on the information that you shared with me it looks like you have A tick that bite that we will treat with a short course of doxycycline.  In most cases a tick bite is painless and does not itch.  Most tick bites in which the tick is quickly removed do not require prescriptions. Ticks can transmit several diseases if they are infected and remain attacked to your skin. Therefore the length that the tick was attached and any symptoms you have experienced after the bite are import to accurately develop your custom treatment plan. In most cases a single dose of doxycycline may prevent the development of a more serious condition.  Based on your information I have Provided a home care guide for tick bites  and  instructions on when to call for help. and Your symptoms indicate that you need a longer course of antibiotics and a follow up visit with a provider. I have sent doxycycline 100 mg twice a day for 21 days to the pharmacy that you selected. You will need to schedule a follow up visit with your provider. If you do not have a primary care provider you may use our telehealth physicians on the web at [facility].  Which ticks  are associated with illness?  The Wood Tick (dog tick) is the size of a watermelon seed and can sometimes transmit [facility] spotted fever and [place] tick fever.   The Deer Tick (black-legged tick) is between the size of a poppy seed (pin head) and an apple seed, and can sometimes transmit Lyme disease.  A brown to black tick with a white splotch on its back is likely a female Amblyomma americanum ([place] tick). This tick has been associated with Southern Tick Associated illness ( STARI)  Lyme disease has become the most common tick-borne illness in the [place]. The risk of Lyme disease following a recognized deer tick bite is estimated to be 1%.  The majority of cases of Lyme  disease start with a bull's eye rash at the site of the tick bite. The rash can occur days to weeks (typically 7-10 days) after a tick bite. Treatment with antibiotics is indicated if this rash appears. Flu-like symptoms may accompany the rash, including: fever, chills, headaches, muscle aches, and fatigue. Removing ticks promptly may prevent tick borne disease.  What can be used to prevent Tick Bites?   Insect repellant with at leas 20% DEET.  Wearing long pants with sock and shoes.  Avoiding tall grass and heavily wooded areas.  Checking your skin after being outdoors.  Shower with a washcloth after outdoor exposures.  HOME CARE ADVICE FOR TICK BITE  1. Wood Tick Removal:  o Use a pair of tweezers and grasp the wood tick close to the skin (on its head). Pull the wood tick straight upward without twisting or crushing it. Maintain a steady pressure until it releases its grip.   o If tweezers aren't available, use fingers, a loop of thread around the jaws, or a needle between the jaws for traction.  o Note: covering the tick with petroleum jelly, nail polish or rubbing alcohol doesn't work. Neither does touching the tick with a hot or cold object. 2. Tiny Deer Tick Removal:   o Needs to be scraped off with a knife blade or credit card edge. o Place tick in a sealed container (e.g. glass jar, zip lock plastic bag),  in case your doctor wants to see it. 3. Tick's Head Removal:  o If the wood tick's head breaks off in the skin, it must be removed. Clean the skin. Then use a sterile needle to uncover the head and lift it out or scrape it off.  o If a very small piece of the head remains, the skin will eventually slough it off. 4. Antibiotic Ointment:  o Wash the wound and your hands with soap and water after removal to prevent catching any tick disease.  Apply an over the counter antibiotic ointment (e.g. bacitracin) to the bite once. 5. Expected Course: Tick bites normally don't itch or  hurt. That's why they often go unnoticed. 6. Call Your Doctor If:  o You can't remove the tick or the tick's head o Fever, a severe head ache, or rash occur in the next 2 weeks o Bite begins to look infected o Lyme's disease is common in your area o You have not had a tetanus in the last 10 years o Your current symptoms become worse    MAKE SURE YOU   Understand these instructions.  Will watch your condition.  Will get help right away if you are not doing well or get worse.   Thank you for choosing an e-visit.  Your e-visit answers were reviewed by a board certified advanced clinical practitioner to complete your personal care plan. Depending upon the condition, your plan could have included both over the counter or prescription medications. Please review your pharmacy choice. If there is a problem you may use MyChart messaging to have the prescription routed to another pharmacy. Your safety is important to [place]. If you have drug allergies check your prescription carefully.   You can use MyChart to ask questions about today's visit, request a non-urgent call back, or ask for a work or school excuse for 24 hours related to this e-Visit. If it has been greater than 24 hours you will need to follow up with your provider, or enter a new e-Visit to address those concerns.  You will get an email in the next two days asking about your experience. I hope  that your e-visit has been valuable and will speed your recovery  5 minutes spent reviewing and documenting in chart.

## 2018-06-21 ENCOUNTER — Other Ambulatory Visit: Payer: Self-pay | Admitting: Internal Medicine

## 2018-06-21 MED ORDER — DOXYCYCLINE HYCLATE 100 MG PO TABS: 100.0000 mg | ORAL_TABLET | Freq: Two times a day (BID) | ORAL | 0 refills | Status: DC

## 2018-07-03 LAB — HEPATIC FUNCTION PANEL
ALT: 13 IU/L (ref 0–32)
AST: 18 IU/L (ref 0–40)
Albumin: 4.4 g/dL (ref 3.8–4.9)
Alkaline Phosphatase: 174 IU/L — ABNORMAL HIGH (ref 39–117)
BILIRUBIN TOTAL: 0.3 mg/dL (ref 0.0–1.2)
Bilirubin, Direct: 0.09 mg/dL (ref 0.00–0.40)
Total Protein: 6.7 g/dL (ref 6.0–8.5)

## 2018-07-04 ENCOUNTER — Other Ambulatory Visit: Payer: Self-pay | Admitting: Internal Medicine

## 2018-07-04 MED ORDER — PRAVASTATIN SODIUM 20 MG PO TABS: 20.0000 mg | ORAL_TABLET | Freq: Every day | ORAL | 3 refills | Status: DC

## 2018-07-04 MED ORDER — VITAMIN E 180 MG (400 UNIT) PO CAPS: 800.0000 [IU] | ORAL_CAPSULE | Freq: Every day | ORAL | 11 refills | Status: DC

## 2018-07-05 MED FILL — ZOLPIDEM TARTRATE 5 MG TAB: 5 | 30 days supply | Qty: 30 | Fill #0

## 2018-07-09 ENCOUNTER — Ambulatory Visit (INDEPENDENT_AMBULATORY_CARE_PROVIDER_SITE_OTHER): Payer: Self-pay | Admitting: Physician Assistant

## 2018-07-09 ENCOUNTER — Other Ambulatory Visit: Payer: Self-pay

## 2018-07-09 VITALS — BP 112/86 | HR 97 | Temp 98.5°F | Resp 16 | Wt 174.0 lb

## 2018-07-09 DIAGNOSIS — W57XXXS Bitten or stung by nonvenomous insect and other nonvenomous arthropods, sequela: Principal | ICD-10-CM

## 2018-07-09 DIAGNOSIS — S30860S Insect bite (nonvenomous) of lower back and pelvis, sequela: Secondary | ICD-10-CM

## 2018-07-09 DIAGNOSIS — L03012 Cellulitis of left finger: Secondary | ICD-10-CM

## 2018-07-09 MED ORDER — TRIAMCINOLONE ACETONIDE 0.1 % EX CREA: 1.0000 "application " | TOPICAL_CREAM | Freq: Two times a day (BID) | CUTANEOUS | 0 refills | Status: DC

## 2018-07-09 NOTE — Progress Notes (Signed)
Patient ID: [name] DOB: 09/08/1965 AGE: 53 y.o. MRN: [id number]   PCP: Crecencio Mc, MD   Chief Complaint:  Chief Complaint  Patient presents with  . Tick Removal    4wk  . Hand Pain    x3wk     Subjective:    HPI:  Paula Jensen is a 53 y.o. female presents for evaluation  Chief Complaint  Patient presents with  . Tick Removal    4wk  . Hand Pain    x61wk    53 year old female presents to [facility] with two complaints.  First complaint: Patient reports 5-1/2 week history of irritated skin lesion, s/p tick bite, located on right trunk. Patient completed E-Visit on 06/15/2018. Reported tick bite on 06/01/2018, two weeks prior to E-Visit. Located on right side of trunk. Tick was size of poppy seed. Was removed with tweezers. Unsure how long tick was attached; suspects less than one day. Associated redness, pain at site and lymphadenopathy. Patient was prescribed 21 days of Doxycycline 100mg  bid. Patient discussed wtih PCP, Dr. Deborra Medina MD, if also advised patient take full 21 days of antibiotic.  Patient states pain, erythema, and lymphadenopathy resolved. However, continues to have raised lesion. Appearance similar to insect bite. Associated pruritis; mild and intermittent. Has not been applying or taking any OTC medication for symptom relief. Denies fever, chills, sweats, headache, myalgia/arthralgias. Denies bleeding from lesion, pus from lesion, streaking redness. Patient does admit to scratching/itching lesion. Denies previous history of tick bite; where she was seen by a provider. Has never had tick borne illness blood work [employer].   Second complaint: Patient reports 3 week history of left thumb pain. Located along lateral aspect of fingernail. Waxing and waning erythema and edema. Very tender to palpation. Has been performing warm soaks, applying Neosporin, and attempted with tweezers to find ingrown nail. Previous history of similar symptoms  several years ago, same thumb, resolved with removing ingrown nail at home. Patient is right hand dominant; has been babying/using left hand less. Denies fever, chills, sweats, streaking redness, pus/purulent drainage, finger weakness/paresthesias. Denies finger injury/trauma. Patient with recurrent ingrown toenails on great toe; treated by podiatrist with chemical cauterization, no continued issues.   A limited review of symptoms was performed, pertinent positives and negatives as mentioned in HPI.  The following portions of the patient's history were reviewed and updated as appropriate: allergies, current medications and past medical history.  Patient Active Problem List   Diagnosis Date Noted  . Marital conflict [id number]  . Menopausal state 04/15/2018  . Perimenopause 09/12/2017  . Foot pain, left 04/08/2017  . IBS (irritable bowel syndrome) 04/05/2016  . Anxiety, generalized 12/02/2015  . Insomnia 04/05/2015  . Alkaline phosphatase elevation 03/21/2014  . Encounter for preventive health examination 02/17/2012  . Overweight (BMI 25.0-29.9) 10/10/2011  . Allergic rhinitis, seasonal   . GERD (gastroesophageal reflux disease)     No Known Allergies  Current Outpatient Medications on File Prior to Visit  Medication Sig Dispense Refill  . ALPRAZolam (XANAX) 0.25 MG tablet Take 1 tablet (0.25 mg total) by mouth at bedtime as needed. 30 tablet 1  . citalopram (CELEXA) 20 MG tablet Take 1 tablet (20 mg total) by mouth daily. 90 tablet 1  . clindamycin (CLINDAGEL) 1 % gel Apply topically 2 (two) times daily. 30 g 5  . doxycycline (ADOXA) 50 MG tablet Take 1 tablet (50 mg total) by mouth daily. 30 tablet 6  . Magnesium 500 MG CAPS  Take 2 capsules by mouth at bedtime.    . pravastatin (PRAVACHOL) 20 MG tablet Take 1 tablet (20 mg total) by mouth daily. 90 tablet 3  . vitamin E (VITAMIN E) 400 UNIT capsule Take 2 capsules (800 Units total) by mouth daily. 60 capsule 11  . zolpidem (AMBIEN)  5 MG tablet Take 1 tablet (5 mg total) by mouth at bedtime as needed. 30 tablet 5  . venlafaxine XR (EFFEXOR-XR) 37.5 MG 24 hr capsule Take 3 capsules (112.5 mg total) by mouth daily with breakfast. (Patient not taking: Reported on 07/09/2018) 90 capsule 5  . [DISCONTINUED] dicyclomine (BENTYL) 20 MG tablet Take 1 tablet (20 mg total) by mouth every 6 (six) hours. 90 tablet 2   No current facility-administered medications on file prior to visit.        Objective:   Vitals:   07/09/18 1327  BP: 112/86  Pulse: 97  Resp: 16  Temp: 98.5 F (36.9 C)  SpO2: 97%     Wt Readings from Last 3 Encounters:  07/09/18 174 lb (78.9 kg)  04/13/18 170 lb 3.2 oz (77.2 kg)  09/11/17 168 lb (76.2 kg)    Physical Exam:   General Appearance:  Patient sitting comfortably on examination table. Conversational. Kermit Balo self-historian. In no acute distress. Afebrile.   Head:  Normocephalic, without obvious abnormality, atraumatic  Eyes:  PERRL, conjunctiva/corneas clear, EOM's intact  Neck: Supple, symmetrical, trachea midline, no adenopathy  Lungs:   Clear to auscultation bilaterally, respirations unlabored. Good aeration. No rales, rhonchi, crackles or wheezing.  Heart:  Regular rate and rhythm, S1 and S2 normal, no murmur, rub, or gallop  Extremities: Left hand inspection reveals mild edema at distal aspect of thumb fingernail, lateral aspect. No erythema. No streaking redness. 0.5cm circular scab adjacent to distal portion of fingernail. Moderate tenderness with palpation at scab. No significant edema of finger. No palpable fluctuance. No edema/erythema of cuticle. Fingernail healthy; no cracks, thickening of fingernail, yellow/green discoloration. Full ROM of finger. 5/5 motor strength. Sensation intact. Brisk capillary refill.  Pulses: 2+ and symmetric  Skin: Right side, just posterior to mid axillary line, at lower rib, reveals ovular raised erythema, 1cm x 3cm. Pinpoint erythematous scab in center.  Mild palpable induration, 1cm circular area. No palpable fluctuance. No tenderness with palpation. No streaking redness. No palpable warmth. No dry/flaky skin.  Lymph nodes: Cervical, supraclavicular, and axillary nodes normal  Neurologic: Normal    Assessment & Plan:    Exam findings, diagnosis etiology and medication use and indications reviewed with patient. Follow-Up and discharge instructions provided. No emergent/urgent issues found on exam.  Patient education was provided.   Patient verbalized understanding of information provided and agrees with plan of care (POC), all questions answered. The patient is advised to call or return to clinic if condition does not see an improvement in symptoms, or to seek the care of the closest emergency department if condition worsens with the below plan.    Procedure performed. Ingrown nail removal. Verbal consent obtained from patient. Patient placed in semi-recumbant position on exam room bed. Wound cleaned with hibiclens. Digital nerve block performed with 3cc's of Lidocaine without Epi. Adequate anesthesia obtained. Hemostat used to lift lateral aspect of fingernail. Ingrown nail lifted. Trimmed using scissors. Scant bleeding. No purulent drainage. Wound cleaned with sterile water. Bacitracin applied. Wound bandaged with bandaid. Patient tolerated procedure well.   1. Tick bite of back, sequela - triamcinolone cream (KENALOG) 0.1 %; Apply 1 application topically 2 (two)  times daily.  Dispense: 30 g; Refill: 0  2. Hangnail of digit of left hand  Patient with two complaints. First complaint sequela of tick bite. Patient with tick bite just over 5 weeks ago. Per patient description, tick was deer tick. Attached for less than 48 hours. Patient treated with 21 day course of Doxycycline due to associated erythema (does not appear to be erythema migrans), soreness, and lymphadenopathy. Patient with lingering erythematous papule; appearance consistent  with insect bite, and associated pruritis. Believe patient having irritation from bite. Prescribed topical corticosteroid cream. Advised patient follow-up with PCP or urgent care if limited improvement in 4-5 days. At that time, can discuss dermatology referral. Patient's VSS, afebrile, in no acute distress - do not believe patient has secondary bacterial cellulitis (no constitutional/ill symptoms, warmth, tenderness/soreness, streaking redness, completed Doxy course which has good cellulitis coverage) at site of tick bite and do not believe patient's rash due to tick borne illness. However, if limited improvement with triamcinolone - further evaluation can be performed by PCP, urgent care, or dermatologist. Patient agrees with plan.  Picture attached of skin rash/tick bite using [employer].  Second complaint left thumb pain. Located at distal aspect of lateral fingernail. Present for 3 weeks. Ingrown nail removal procedure performed, successful. Sliver of fingernail found. Trimmed/removed. No associated erythema, streaking redness, or purulent drainage; do not believe antibiotic necessary. No associated paronychia. No neurovascular red flags of thumb. Advised patient monitor fingernail closely, ensure trimmed fingernail does not grow in to soft tissue again. Advised over the counter ibuprofen for pain/discomfort and swelling. Advised patient follow-up with [facility], urgent care, or PCP if no improvement in 2-3 days, sooner with worsening/concerning symptoms. Patient agrees with plan.   Darlin Priestly, MHS, PA-C Montey Hora, MHS, PA-C Advanced Practice Provider [facility]  [address] [place], [place] [address] (p): [phone] [email or web address]

## 2018-07-09 NOTE — Patient Instructions (Addendum)
Thank you for choosing [facility] for your health care needs.  You have been diagnosed with two medical conditions. Tick bite sequela.  Continue to monitor tick bite site. Apply prescription topical steroid cream for symptom relief. Recommend follow-up with family physician in 4-5 days if not improving. Sooner with any worsening symptoms like worsening rash, fever/chills, body aches, streaking redness, pain with rash, or other new/concerning symptom.  Hangnail of left thumb. Procedure performed. Continue to clean wound regularly; normal soap and water 2-3 times a day. May apply thin layer of antibiotic ointment once a day. May take over the counter ibuprofen for pain/discomfort and swelling. Follow-up with [facility], urgent care, or family physician if no improvement in 2-3 days; sooner with worsening symptoms such as redness, worsening pain, purulent drainage/pus, finger weakness/paresthesias, or other new/concerning symptom.  Hope you have a great day!  Tick Bite Information, Adult  Ticks are insects that can bite. Most ticks live in shrubs and grassy areas. They climb onto people and animals that go by. Then they bite. Some ticks carry germs that can make you sick. How can I prevent tick bites?  Use an insect repellent that has 20% or higher of the ingredients DEET, picaridin, or IR3535. Put this insect repellent on: ? Bare skin. ? The tops of your boots. ? Your pant legs. ? The ends of your sleeves.  If you use an insect repellent that has the ingredient permethrin, make sure to follow the instructions on the bottle. Treat the following: ? Clothing. ? Supplies. ? Boots. ? Tents.  Wear long sleeves, long pants, and light colors.  Tuck your pant legs into your socks.  Stay in the middle of the trail.  Try not to walk through long grass.  Before going inside your house, check your clothes, hair, and skin for ticks. Make sure to check your head, neck, armpits, waist, groin,  and joint areas.  Check for ticks every day.  When you come indoors: ? Wash your clothes right away. ? Shower right away. ? Dry your clothes in a dryer on high heat for 60 minutes or more. What is the right way to remove a tick? Remove a tick from your skin as soon as possible.  To remove a tick that is crawling on your skin: ? Go outdoors and brush the tick off. ? Use tape or a lint roller.  To remove a tick that is biting: ? Wash your hands. ? If you have latex gloves, put them on. ? Use tweezers, curved forceps, or a tick-removal tool to grasp the tick. Grasp the tick as close to your skin and as close to the tick's head as possible. ? Gently pull up until the tick lets go.  Try to keep the tick's head attached to its body.  Do not twist or jerk the tick.  Do not squeeze or crush the tick. Do not try to remove a tick with heat, alcohol, petroleum jelly, or fingernail polish. How should I get rid of a tick? Here are some ways to get rid of a tick that is alive:  Place the tick in rubbing alcohol.  Place the tick in a bag or container you can close tightly.  Wrap the tick tightly in tape.  Flush the tick down the toilet. Contact a doctor if:  You have symptoms of a disease, such as: ? Pain in a muscle, joint, or bone. ? Trouble walking or moving your legs. ? Numbness in your legs. ? Inability  to move (paralysis). ? A red rash that makes a circle (bull's-eye rash). ? Redness and swelling where the tick bit you. ? A fever. ? Throwing up (vomiting) over and over. ? Diarrhea. ? Weight loss. ? Tender and swollen lymph glands. ? Shortness of breath. ? Cough. ? Belly pain (abdominal pain). ? Headache. ? Being more tired than normal. ? A change in how alert (conscious) you are. ? Confusion. Get help right away if:  You cannot remove a tick.  A part of a tick breaks off and gets stuck in your skin.  You are feeling worse. Summary  Ticks may carry germs that  can make you sick.  To prevent tick bites, wear long sleeves, long pants, and light colors. Use insect repellent. Follow the instructions on the bottle.  If the tick is biting, do not try to remove it with heat, alcohol, petroleum jelly, or fingernail polish.  Use tweezers, curved forceps, or a tick-removal tool to grasp the tick. Gently pull up until the tick lets go. Do not twist or jerk the tick. Do not squeeze or crush the tick.  If you have symptoms, contact a doctor. This information is not intended to replace advice given to you by your health care provider. Make sure you discuss any questions you have with your health care provider. Document Released: 06/01/2009 Document Revised: 06/17/2016 Document Reviewed: 06/17/2016 Elsevier Interactive Patient Education  2019 [employer].

## 2018-07-12 ENCOUNTER — Telehealth: Payer: Self-pay | Admitting: Emergency Medicine

## 2018-07-12 NOTE — Telephone Encounter (Signed)
I spoke with patient who informed me that she is so much better. This was a follow up call in regards to [facility] visit

## 2018-07-30 ENCOUNTER — Other Ambulatory Visit: Payer: Self-pay

## 2018-07-30 ENCOUNTER — Ambulatory Visit (INDEPENDENT_AMBULATORY_CARE_PROVIDER_SITE_OTHER): Payer: No Typology Code available for payment source | Admitting: Family Medicine

## 2018-07-30 ENCOUNTER — Encounter: Payer: Self-pay | Admitting: Family Medicine

## 2018-07-30 DIAGNOSIS — L237 Allergic contact dermatitis due to plants, except food: Secondary | ICD-10-CM

## 2018-07-30 MED ORDER — PREDNISONE 5 MG (21) PO TBPK: ORAL_TABLET | ORAL | 0 refills | Status: DC

## 2018-07-30 NOTE — Progress Notes (Signed)
Patient ID: Paula Jensen, female   DOB: 1966/01/05, 53 y.o.   MRN: [id number]    Virtual Visit via video note  This visit type was conducted due to national recommendations for restrictions regarding the COVID-19 pandemic (e.g. social distancing).  This format is felt to be most appropriate for this patient at this time.  All issues noted in this document were discussed and addressed.  No physical exam was performed (except for noted visual exam findings with Video Visits).   I connected with Hart Rochester today at 11:20 AM EDT by a video enabled telemedicine application or telephone and verified that I am speaking with the correct person using two identifiers. Location patient: work Location provider: [facility] Persons participating in the virtual visit: patient, provider  I discussed the limitations, risks, security and privacy concerns of performing an evaluation and management service by telephone and the availability of in person appointments. I also discussed with the patient that there may be a patient responsible charge related to this service. The patient expressed understanding and agreed to proceed.  HPI:  Patient and I connected via video due to rash on neck and upper on the right side of the face.  Patient suspects it is contact dermatitis related to poison ivy.  She was doing yard work over the weekend and this morning when she woke up she noted a red raised rash on neck and up on the right cheek.  Patient states she had some leftover Kenalog cream from a previous rash and rub some object this morning which did help to calm down the itching.  Patient denies fever or chills.  Denies body aches.  Denies cough, shortness breath or wheezing.  Denies chest pain.  Denies GI or GU issues.  ROS: See pertinent positives and negatives per HPI.  Past Medical History:  Diagnosis Date  . allergic rhinitis   . Cancer ([place])    skin  . GERD (gastroesophageal reflux disease)     Past Surgical History:  Procedure Laterality Date  . DILATION AND CURETTAGE OF UTERUS  June 2012   [place] , for heavy bleeding   . laparoscopy  1996  . TONSILLECTOMY AND ADENOIDECTOMY     Family History  Problem Relation Age of Onset  . Heart disease Father        atrial fibrillation  . Cancer Neg Hx   . Breast cancer Neg Hx    Social History   Tobacco Use  . Smoking status: Never Smoker  . Smokeless tobacco: Never Used  Substance Use Topics  . Alcohol use: Yes    Alcohol/week: 6.0 standard drinks    Types: 6 Glasses of wine per week    Current Outpatient Medications:  .  ALPRAZolam (XANAX) 0.25 MG tablet, Take 1 tablet (0.25 mg total) by mouth at bedtime as needed., Disp: 30 tablet, Rfl: 1 .  citalopram (CELEXA) 20 MG tablet, Take 1 tablet (20 mg total) by mouth daily., Disp: 90 tablet, Rfl: 1 .  clindamycin (CLINDAGEL) 1 % gel, Apply topically 2 (two) times daily., Disp: 30 g, Rfl: 5 .  doxycycline (ADOXA) 50 MG tablet, Take 1 tablet (50 mg total) by mouth daily., Disp: 30 tablet, Rfl: 6 .  Magnesium 500 MG CAPS, Take 2 capsules by mouth at bedtime., Disp: , Rfl:  .  pravastatin (PRAVACHOL) 20 MG tablet, Take 1 tablet (20 mg total) by mouth daily., Disp: 90 tablet, Rfl: 3 .  triamcinolone cream (KENALOG) 0.1 %, Apply 1 application  topically 2 (two) times daily., Disp: 30 g, Rfl: 0 .  venlafaxine XR (EFFEXOR-XR) 37.5 MG 24 hr capsule, Take 3 capsules (112.5 mg total) by mouth daily with breakfast., Disp: 90 capsule, Rfl: 5 .  vitamin E (VITAMIN E) 400 UNIT capsule, Take 2 capsules (800 Units total) by mouth daily., Disp: 60 capsule, Rfl: 11 .  zolpidem (AMBIEN) 5 MG tablet, Take 1 tablet (5 mg total) by mouth at bedtime as needed., Disp: 30 tablet, Rfl: 5 .  predniSONE (STERAPRED UNI-PAK 21 TAB) 5 MG (21) TBPK tablet, Take according to pack instructions, Disp: 21 tablet, Rfl: 0  EXAM:  GENERAL: alert, oriented, appears well and in no acute distress  HEENT: atraumatic,  conjunttiva clear, no obvious abnormalities on inspection of external nose and ears  NECK: normal movements of the head and neck  LUNGS: on inspection no signs of respiratory distress, breathing rate appears normal, no obvious gross SOB, gasping or wheezing  CV: no obvious cyanosis  SKIN: Over video of the I am able to see a red raised patchy rash on patient's neck and right side of cheek that does appear consistent with a poison ivy contact dermatitis.  No vesicles or pustules seen over video feed.  MS: moves all visible extremities without noticeable abnormality  PSYCH/NEURO: pleasant and cooperative, no obvious depression or anxiety, speech and thought processing grossly intact  ASSESSMENT AND PLAN:  Discussed the following assessment and plan:  Rash and nonspecific skin eruption  Patient advised to take an oral antihistamine every day such as Claritin or Zyrtec to help calm bodies histamine response.  We will also do oral steroid taper to calm down inflammation and rash.  Advised she can use skin moisturizing lotion such as CeraVe or Cetaphil to help calm rash, advised that I would avoid use of the Kenalog cream on her face consistently as long-term steroid cream use can discolor skin and we do not want any discolored patches on face.  Also advised to launder bedding, wipe down commonly use surfaces in home as the poison ivy oils could have gotten on multiple places and it could cause more spread of the rash.   I discussed the assessment and treatment plan with the patient. The patient was provided an opportunity to ask questions and all were answered. The patient agreed with the plan and demonstrated an understanding of the instructions.   The patient was advised to call back or seek an in-person evaluation if the symptoms worsen or if the condition fails to improve as anticipated.   Jodelle Green, FNP

## 2018-07-31 ENCOUNTER — Encounter: Payer: Self-pay | Admitting: Family Medicine

## 2018-07-31 DIAGNOSIS — F411 Generalized anxiety disorder: Secondary | ICD-10-CM

## 2018-08-01 MED FILL — ZOLPIDEM TARTRATE 5 MG TAB: 5 | 30 days supply | Qty: 30 | Fill #1

## 2018-08-20 ENCOUNTER — Telehealth: Payer: No Typology Code available for payment source | Admitting: Physician Assistant

## 2018-08-20 ENCOUNTER — Encounter: Payer: Self-pay | Admitting: Physician Assistant

## 2018-08-20 DIAGNOSIS — L237 Allergic contact dermatitis due to plants, except food: Secondary | ICD-10-CM | POA: Diagnosis not present

## 2018-08-20 MED ORDER — TRIAMCINOLONE ACETONIDE 0.1 % EX CREA: 1.0000 "application " | TOPICAL_CREAM | Freq: Two times a day (BID) | CUTANEOUS | 0 refills | Status: DC

## 2018-08-20 NOTE — Progress Notes (Signed)
E Visit for Rash  We are sorry that you are not feeling well. Here is how we plan to help!  Based on what you shared with me it looks like you have contact dermatitis.  Contact dermatitis is a skin rash caused by something that touches the skin and causes irritation or inflammation.  Your skin may be red, swollen, dry, cracked, and itch.  The rash should go away in a few days but can last a few weeks.  If you get a rash, it's important to figure out what caused it so the irritant can be avoided in the future.  Since you recently were on oral steroids, and to avoid overuse of oral steroids and since your rash is now only localized to the inner thighs, I have prescribed Triamcinolone, a steroid cream, apply a thin layer twice daily over area of rash. Use sparingly- avoid overuse as it can cause thinning of the skin.     HOME CARE:   Take cool showers and avoid direct sunlight.  Apply cool compress or wet dressings.  Take a bath in an oatmeal bath.  Sprinkle content of one Aveeno packet under running faucet with comfortably warm water.  Bathe for 15-20 minutes, 1-2 times daily.  Pat dry with a towel. Do not rub the rash.  Use hydrocortisone cream.  Take an antihistamine like Benadryl for widespread rashes that itch.  The adult dose of Benadryl is 25-50 mg by mouth 4 times daily.  Caution:  This type of medication may cause sleepiness.  Do not drink alcohol, drive, or operate dangerous machinery while taking antihistamines.  Do not take these medications if you have prostate enlargement.  Read package instructions thoroughly on all medications that you take.  GET HELP RIGHT AWAY IF:   Symptoms don't go away after treatment.  Severe itching that persists.  If you rash spreads or swells.  If you rash begins to smell.  If it blisters and opens or develops a yellow-brown crust.  You develop a fever.  You have a sore throat.  You become short of breath.  MAKE SURE YOU:  Understand  these instructions. Will watch your condition. Will get help right away if you are not doing well or get worse.  Thank you for choosing an e-visit. Your e-visit answers were reviewed by a board certified advanced clinical practitioner to complete your personal care plan. Depending upon the condition, your plan could have included both over the counter or prescription medications. Please review your pharmacy choice. Be sure that the pharmacy you have chosen is open so that you can pick up your prescription now.  If there is a problem you may message your provider in [place] to have the prescription routed to another pharmacy. Your safety is important to [place]. If you have drug allergies check your prescription carefully.  For the next 24 hours, you can use MyChart to ask questions about today's visit, request a non-urgent call back, or ask for a work or school excuse from your e-visit provider. You will get an email in the next two days asking about your experience. I hope that your e-visit has been valuable and will speed your recovery.     I have spent 7 min in completion and review of this note- Lacy Duverney [facility]

## 2018-08-29 ENCOUNTER — Encounter: Payer: Self-pay | Admitting: Internal Medicine

## 2018-08-30 MED FILL — ZOLPIDEM TARTRATE 5 MG TAB: 5 | 30 days supply | Qty: 30 | Fill #2

## 2018-08-31 ENCOUNTER — Other Ambulatory Visit: Payer: Self-pay | Admitting: Internal Medicine

## 2018-08-31 NOTE — Telephone Encounter (Signed)
Refilled: 04/13/2018 Last OV: 07/30/2018 Next OV: not scheduled

## 2018-09-17 MED FILL — CITALOPRAM HBR 20 MG TABLET: 20 | 90 days supply | Qty: 90 | Fill #0

## 2018-09-26 DIAGNOSIS — F411 Generalized anxiety disorder: Secondary | ICD-10-CM

## 2018-09-26 MED FILL — ZOLPIDEM TARTRATE 5 MG TAB: 5 | 30 days supply | Qty: 30 | Fill #0

## 2018-10-09 ENCOUNTER — Other Ambulatory Visit: Payer: Self-pay

## 2018-10-09 ENCOUNTER — Encounter: Payer: Self-pay | Admitting: Internal Medicine

## 2018-10-09 ENCOUNTER — Ambulatory Visit (INDEPENDENT_AMBULATORY_CARE_PROVIDER_SITE_OTHER): Payer: No Typology Code available for payment source | Admitting: Internal Medicine

## 2018-10-09 DIAGNOSIS — K625 Hemorrhage of anus and rectum: Secondary | ICD-10-CM | POA: Diagnosis not present

## 2018-10-09 DIAGNOSIS — F411 Generalized anxiety disorder: Secondary | ICD-10-CM

## 2018-10-09 DIAGNOSIS — F10982 Alcohol use, unspecified with alcohol-induced sleep disorder: Secondary | ICD-10-CM

## 2018-10-09 DIAGNOSIS — R748 Abnormal levels of other serum enzymes: Secondary | ICD-10-CM | POA: Diagnosis not present

## 2018-10-09 DIAGNOSIS — Z0000 Encounter for general adult medical examination without abnormal findings: Secondary | ICD-10-CM

## 2018-10-09 MED ORDER — DOXYCYCLINE MONOHYDRATE 50 MG PO TABS: 50.0000 mg | ORAL_TABLET | Freq: Every day | ORAL | 6 refills | Status: DC

## 2018-10-09 MED ORDER — ALPRAZOLAM 0.25 MG PO TABS: 0.2500 mg | ORAL_TABLET | Freq: Every evening | ORAL | 1 refills | Status: DC | PRN

## 2018-10-09 NOTE — Patient Instructions (Signed)
Proctofoam    anusol hc  Nupercainal   Glycerin suppositories (Just in case )

## 2018-10-09 NOTE — Progress Notes (Signed)
Virtual Visit via [email or web address] Note  This visit type was conducted due to national recommendations for restrictions regarding the COVID-19 pandemic (e.g. social distancing).  This format is felt to be most appropriate for this patient at this time.  All issues noted in this document were discussed and addressed.  No physical exam was performed (except for noted visual exam findings with Video Visits).   I connected with@ on 10/09/18 at  4:30 PM EDT by a video enabled telemedicine application  and verified that I am speaking with the correct person using two identifiers. Location patient: home Location provider: work or home office Persons participating in the virtual visit: patient, provider  I discussed the limitations, risks, security and privacy concerns of performing an evaluation and management service by telephone and the availability of in person appointments. I also discussed with the patient that there may be a patient responsible charge related to this service. The patient expressed understanding and agreed to proceed.  Reason for visit: follow up on multiple issues   HPI:  53 yr old female last seen in January for CPE . Persistent alk phos  enzyme elevation addressed and ultrasound done with  fatty liver noted. Alcohol abuse identified and addressed.  Request for psychology referral made. . seeing Beckey Downing for counselling and help with alcohol abuse. Therapist is in [place]  For anxiety managed with alcohol abuse. Not sure she wants to continue ,  Because she feels that the sessions are dredging up  other things that are not pleasant to address. Dysfunctional family,  Lack of control over events and choices others are making discussed.    Rectal bleeding .  Intermittent . Denies constipation ,  Rectal pain. External hemorrhoids.  Mild irritation .    ROS: See pertinent positives and negatives per HPI.  Past Medical History:  Diagnosis Date  . allergic rhinitis   . Cancer ([place])    skin  .  GERD (gastroesophageal reflux disease)     Past Surgical History:  Procedure Laterality Date  . DILATION AND CURETTAGE OF UTERUS  June 2012   [place] , for heavy bleeding   . laparoscopy  1996  . TONSILLECTOMY AND ADENOIDECTOMY      Family History  Problem Relation Age of Onset  . Heart disease Father        atrial fibrillation  . Cancer Neg Hx   . Breast cancer Neg Hx     SOCIAL HX:  reports that she has never smoked. She has never used smokeless tobacco. She reports current alcohol use of about 6.0 standard drinks of alcohol per week. She reports that she does not use drugs.   Current Outpatient Medications:  .  ALPRAZolam (XANAX) 0.25 MG tablet, Take 1 tablet (0.25 mg total) by mouth at bedtime as needed., Disp: 30 tablet, Rfl: 1 .  citalopram (CELEXA) 20 MG tablet, Take 1 tablet (20 mg total) by mouth daily., Disp: 90 tablet, Rfl: 1 .  clindamycin (CLINDAGEL) 1 % gel, Apply topically 2 (two) times daily., Disp: 30 g, Rfl: 5 .  Magnesium 500 MG CAPS, Take 2 capsules by mouth at bedtime., Disp: , Rfl:  .  pravastatin (PRAVACHOL) 20 MG tablet, Take 1 tablet (20 mg total) by mouth daily., Disp: 90 tablet, Rfl: 3 .  triamcinolone cream (KENALOG) 0.1 %, Apply 1 application topically 2 (two) times daily., Disp: 30 g, Rfl: 0 .  triamcinolone cream (KENALOG) 0.1 %, Apply 1 application topically 2 (two) times daily., Disp: 30  g, Rfl: 0 .  vitamin E (VITAMIN E) 400 UNIT capsule, Take 2 capsules (800 Units total) by mouth daily., Disp: 60 capsule, Rfl: 11 .  zolpidem (AMBIEN) 5 MG tablet, TAKE 1 TABLET (5 MG TOTAL) BY MOUTH AT BEDTIME AS NEEDED., Disp: 30 tablet, Rfl: 5 .  doxycycline (ADOXA) 50 MG tablet, Take 1 tablet (50 mg total) by mouth daily., Disp: 30 tablet, Rfl: 6  EXAM:  VITALS per patient if applicable:  GENERAL: alert, oriented, appears well and in no acute distress  HEENT: atraumatic, conjunttiva clear, no obvious abnormalities on inspection of external nose and  ears  NECK: normal movements of the head and neck  LUNGS: on inspection no signs of respiratory distress, breathing rate appears normal, no obvious gross SOB, gasping or wheezing  CV: no obvious cyanosis  MS: moves all visible extremities without noticeable abnormality  PSYCH/NEURO: pleasant and cooperative, no obvious depression or anxiety, speech and thought processing grossly intact  ASSESSMENT AND PLAN:  Insomnia Chronic, with no improvement in prior trials of over-the-counter first generation antihistamines.  Continue alternating ambien 5 mg  with alprazolam 0.25 mg . The risks and benefits of benzodiazepine use were reviewed with patient today including excessive sedation leading to respiratory depression,  impaired thinking/driving, and addiction.  Patient was advised to avoid concurrent use with alcohol, to use medication only as needed and not to share with others  .    Anxiety, generalized Improved control with Citalopram dose  20 mg daily.  Encouraged to continue talk therapy to address control issues  Alkaline phosphatase elevation Secondary to fatty liver and alcohol abuse. Pravastatin and vitamin E prescribed.   Rectal bleeding Intermittent,  Painless, with feelings of irritation suggestive of internal hemorrhoids,  Recommend use of steroid cream/pramoxine . Checking coags and platelets.  Gi referral if persistent     I discussed the assessment and treatment plan with the patient. The patient was provided an opportunity to ask questions and all were answered. The patient agreed with the plan and demonstrated an understanding of the instructions.   The patient was advised to call back or seek an in-person evaluation if the symptoms worsen or if the condition fails to improve as anticipated.  I provided  25 minutes of non-face-to-face time during this encounter.   Crecencio Mc, MD

## 2018-10-10 DIAGNOSIS — K625 Hemorrhage of anus and rectum: Secondary | ICD-10-CM | POA: Insufficient documentation

## 2018-10-10 NOTE — Assessment & Plan Note (Signed)
Intermittent,  Painless, with feelings of irritation suggestive of internal hemorrhoids,  Recommend use of steroid cream/pramoxine . Checking coags and platelets.  Gi referral if persistent

## 2018-10-10 NOTE — Assessment & Plan Note (Addendum)
Improved control with Citalopram dose  20 mg daily.  Encouraged to continue talk therapy to address control issues

## 2018-10-10 NOTE — Assessment & Plan Note (Signed)
Chronic, with no improvement in prior trials of over-the-counter first generation antihistamines.  Continue alternating ambien 5 mg  with alprazolam 0.25 mg . The risks and benefits of benzodiazepine use were reviewed with patient today including excessive sedation leading to respiratory depression,  impaired thinking/driving, and addiction.  Patient was advised to avoid concurrent use with alcohol, to use medication only as needed and not to share with others  .

## 2018-10-10 NOTE — Assessment & Plan Note (Signed)
Secondary to fatty liver and alcohol abuse. Pravastatin and vitamin E prescribed.

## 2018-10-12 ENCOUNTER — Ambulatory Visit: Payer: Self-pay | Admitting: Internal Medicine

## 2018-10-15 ENCOUNTER — Ambulatory Visit: Payer: Self-pay | Admitting: Internal Medicine

## 2018-10-22 ENCOUNTER — Other Ambulatory Visit: Payer: Self-pay | Admitting: Internal Medicine

## 2018-10-31 LAB — ABN PTT/APTT REFLEXIVE PANEL
APTT: 25.2 s
DRVVT Screen Seconds: 43.5 s
INR: 1 ratio
Prothrombin Time: 10.5 s
Thrombin Time: 19.4 s

## 2018-10-31 LAB — COMPREHENSIVE METABOLIC PANEL
ALT: 16 IU/L (ref 0–32)
AST: 21 IU/L (ref 0–40)
Albumin/Globulin Ratio: 2 (ref 1.2–2.2)
Albumin: 4.5 g/dL (ref 3.8–4.9)
Alkaline Phosphatase: 138 IU/L — ABNORMAL HIGH (ref 39–117)
BUN/Creatinine Ratio: 22 (ref 9–23)
BUN: 18 mg/dL (ref 6–24)
Bilirubin Total: 0.4 mg/dL (ref 0.0–1.2)
CO2: 22 mmol/L (ref 20–29)
Calcium: 9.1 mg/dL (ref 8.7–10.2)
Chloride: 98 mmol/L (ref 96–106)
Creatinine, Ser: 0.83 mg/dL (ref 0.57–1.00)
GFR calc Af Amer: 92 mL/min/{1.73_m2} (ref >59)
GFR calc non Af Amer: 80 mL/min/{1.73_m2} (ref >59)
Globulin, Total: 2.3 g/dL (ref 1.5–4.5)
Glucose: 101 mg/dL — ABNORMAL HIGH (ref 65–99)
Potassium: 4.4 mmol/L (ref 3.5–5.2)
Sodium: 140 mmol/L (ref 134–144)
Total Protein: 6.8 g/dL (ref 6.0–8.5)

## 2018-10-31 LAB — CBC WITH DIFFERENTIAL/PLATELET
Basophils Absolute: 0 10*3/uL (ref 0.0–0.2)
Basos: 0 %
EOS (ABSOLUTE): 0.1 10*3/uL (ref 0.0–0.4)
Eos: 1 %
Hematocrit: 42.7 % (ref 34.0–46.6)
Hemoglobin: 14.1 g/dL (ref 11.1–15.9)
Immature Grans (Abs): 0 10*3/uL (ref 0.0–0.1)
Immature Granulocytes: 0 %
Lymphocytes Absolute: 2.4 10*3/uL (ref 0.7–3.1)
Lymphs: 32 %
MCH: 30.2 pg (ref 26.6–33.0)
MCHC: 33 g/dL (ref 31.5–35.7)
MCV: 91 fL (ref 79–97)
Monocytes Absolute: 0.5 10*3/uL (ref 0.1–0.9)
Monocytes: 6 %
Neutrophils Absolute: 4.5 10*3/uL (ref 1.4–7.0)
Neutrophils: 61 %
Platelets: 209 10*3/uL (ref 150–450)
RBC: 4.67 x10E6/uL (ref 3.77–5.28)
RDW: 13 % (ref 11.7–15.4)
WBC: 7.5 10*3/uL (ref 3.4–10.8)

## 2018-10-31 LAB — PROTIME-INR
INR: 1 (ref 0.8–1.2)
Prothrombin Time: 10.3 s (ref 9.1–12.0)

## 2018-10-31 LAB — HEPATIC FUNCTION PANEL: Bilirubin, Direct: 0.16 mg/dL (ref 0.00–0.40)

## 2018-12-17 ENCOUNTER — Other Ambulatory Visit: Payer: Self-pay | Admitting: Internal Medicine

## 2019-01-24 IMAGING — DX DG FOOT COMPLETE 3+V*L*
3 series · 3 of 3 positions shown · non-contrast
Comparison: None.

CLINICAL DATA: Pain

EXAM:
LEFT FOOT - COMPLETE 3+ VIEW

[foot ap]
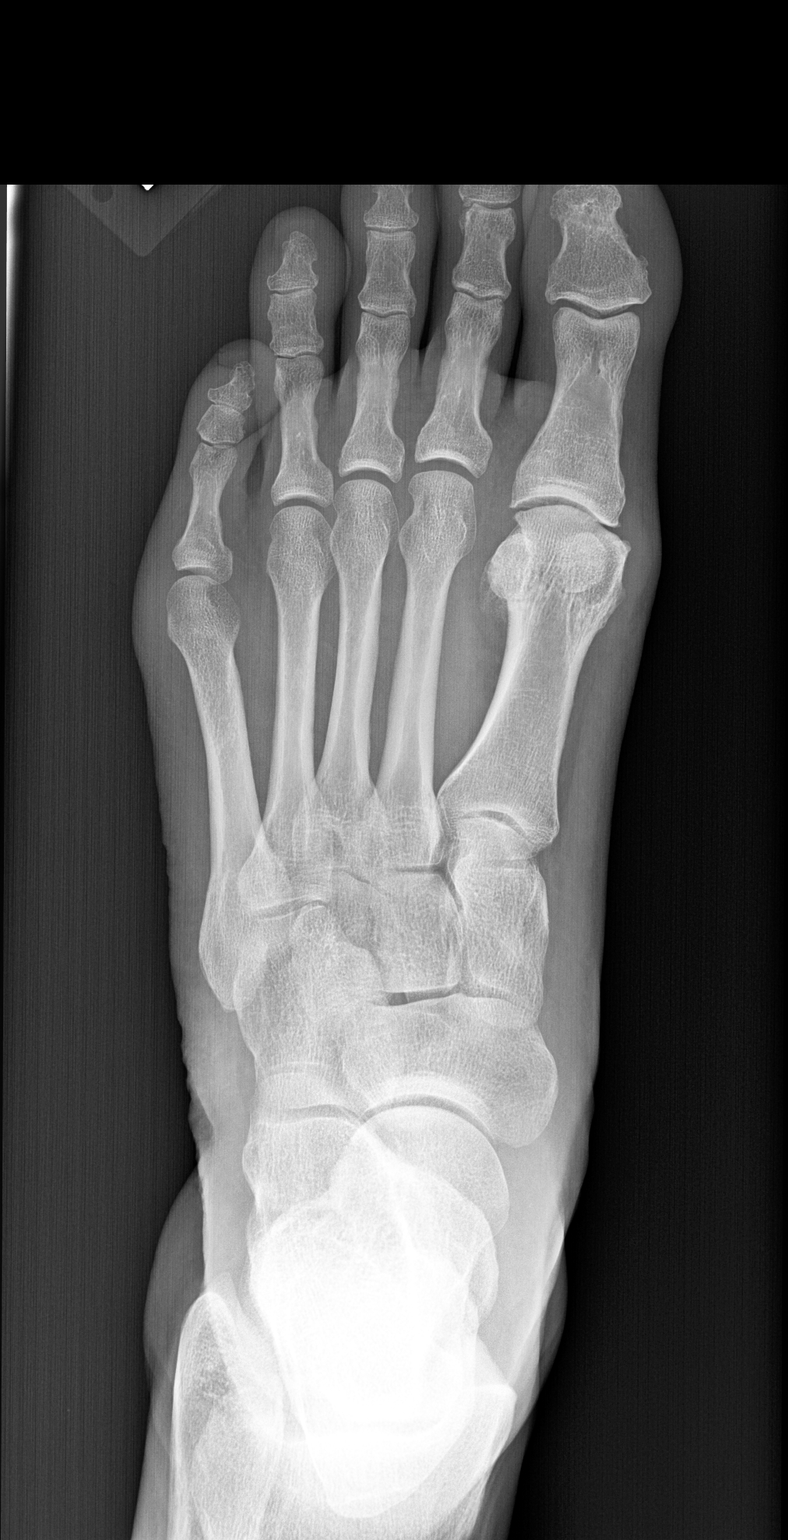

[foot obl (oblique)]
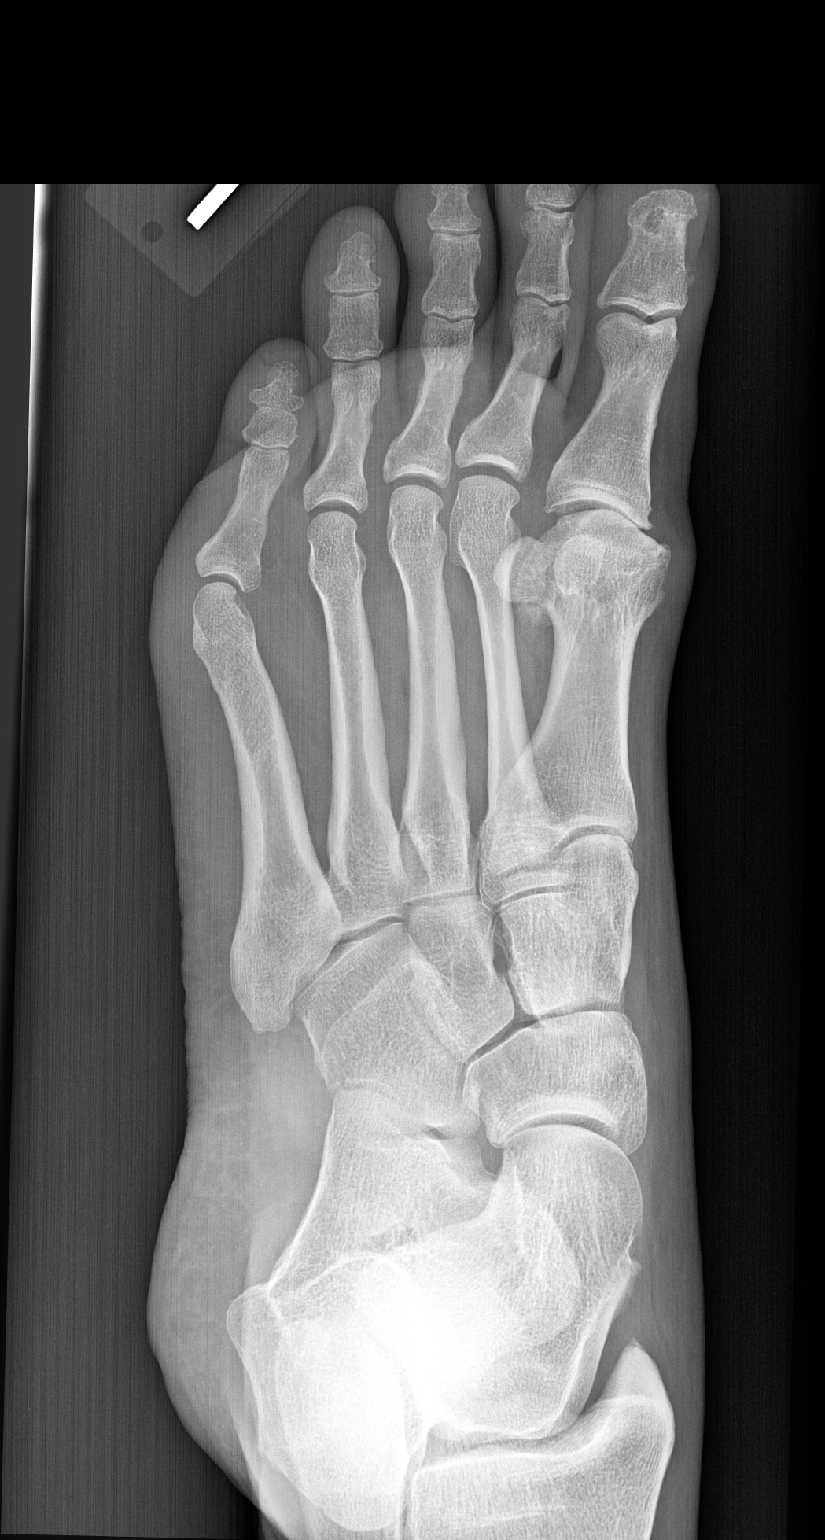

[foot lat]
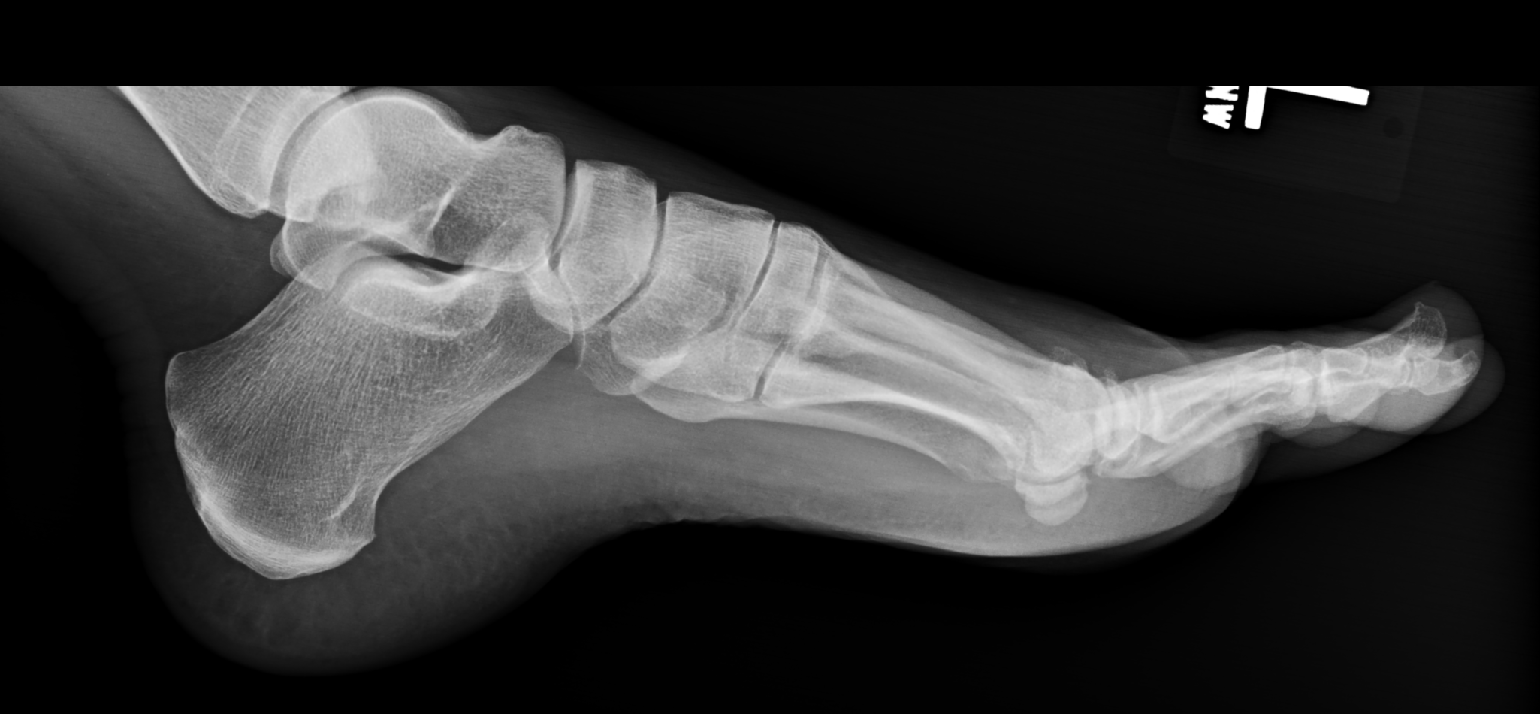

[3 of 3 positions shown; findings below may reference images not displayed]

FINDINGS: Frontal, oblique, and lateral views were obtained. No fracture or
dislocation. There is moderate osteoarthritic change in the first
MTP joint. Other joint spaces appear unremarkable. No erosive
change. There is a small bone island in the distal aspect of the
second distal phalanx.
IMPRESSION: Moderate osteoarthritic change first MTP joint. Other joint spaces
appear unremarkable. No fracture or dislocation.

## 2019-02-04 ENCOUNTER — Other Ambulatory Visit: Payer: Self-pay | Admitting: Internal Medicine

## 2019-02-18 ENCOUNTER — Other Ambulatory Visit: Payer: Self-pay

## 2019-02-18 ENCOUNTER — Ambulatory Visit (INDEPENDENT_AMBULATORY_CARE_PROVIDER_SITE_OTHER): Payer: No Typology Code available for payment source | Admitting: Internal Medicine

## 2019-02-18 ENCOUNTER — Encounter: Payer: Self-pay | Admitting: Internal Medicine

## 2019-02-18 VITALS — Ht 67.25 in | Wt 177.0 lb

## 2019-02-18 DIAGNOSIS — M71349 Other bursal cyst, unspecified hand: Secondary | ICD-10-CM | POA: Diagnosis not present

## 2019-02-18 NOTE — Assessment & Plan Note (Addendum)
Secondary to OA affecting both hands given lack of involvement of other joints of hand, and no evidence of infection or trauma. .  Right hand is more painful thant left  But left index finger has a large cyst on the joint that is becoming problematic due to enlarging size. Referral to Verdell Carmine at [facility] In progress

## 2019-02-18 NOTE — Progress Notes (Signed)
Virtual Visit via [email or web address].ME  This visit type was conducted due to national recommendations for restrictions regarding the COVID-19 pandemic (e.g. social distancing).  This format is felt to be most appropriate for this patient at this time.  All issues noted in this document were discussed and addressed.  No physical exam was performed (except for noted visual exam findings with Video Visits).   I connected with@ on 02/18/19 at 10:00 AM EST by a video enabled telemedicine application  and verified that I am speaking with the correct person using two identifiers. Location patient: home Location provider: work or home office Persons participating in the virtual visit: patient, provider  I discussed the limitations, risks, security and privacy concerns of performing an evaluation and management service by telephone and the availability of in person appointments. I also discussed with the patient that there may be a patient responsible charge related to this service. The patient expressed understanding and agreed to proceed.   Reason for visit: painful swollen index fingers.    HPI:  53 yr old female with OA and Heberden's nodes affecting both index finger DIP's presents with an progressively enlarging cyst on left index finger  ,  Her Non dominant hand,  And a painful Heberden's node on her right index finger.  Aggravated by work an Haematologist  No recent unusual activity.  Joints are not red or warm.     ROS: See pertinent positives and negatives per HPI.  Past Medical History:  Diagnosis Date  . allergic rhinitis   . Cancer ([place])    skin  . GERD (gastroesophageal reflux disease)     Past Surgical History:  Procedure Laterality Date  . DILATION AND CURETTAGE OF UTERUS  June 2012   [place] , for heavy bleeding   . laparoscopy  1996  . TONSILLECTOMY AND ADENOIDECTOMY      Family History  Problem Relation Age of Onset  . Heart disease Father        atrial fibrillation  . Cancer Neg Hx    . Breast cancer Neg Hx     SOCIAL HX:  reports that she has never smoked. She has never used smokeless tobacco. She reports current alcohol use of about 6.0 standard drinks of alcohol per week. She reports that she does not use drugs.   Current Outpatient Medications:  .  ALPRAZolam (XANAX) 0.25 MG tablet, TAKE 1 TABLET BY MOUTH AT BEDTIME AS NEEDED., Disp: 30 tablet, Rfl: 1 .  citalopram (CELEXA) 20 MG tablet, TAKE 1 TABLET (20 MG TOTAL) BY MOUTH DAILY., Disp: 90 tablet, Rfl: 0 .  clindamycin (CLINDAGEL) 1 % gel, Apply topically 2 (two) times daily., Disp: 30 g, Rfl: 5 .  Magnesium 500 MG CAPS, Take 2 capsules by mouth at bedtime., Disp: , Rfl:  .  pravastatin (PRAVACHOL) 20 MG tablet, Take 1 tablet (20 mg total) by mouth daily., Disp: 90 tablet, Rfl: 3 .  triamcinolone cream (KENALOG) 0.1 %, Apply 1 application topically 2 (two) times daily., Disp: 30 g, Rfl: 0 .  zolpidem (AMBIEN) 5 MG tablet, TAKE 1 TABLET (5 MG TOTAL) BY MOUTH AT BEDTIME AS NEEDED., Disp: 30 tablet, Rfl: 5 .  doxycycline (ADOXA) 50 MG tablet, Take 1 tablet (50 mg total) by mouth daily. (Patient not taking: Reported on 02/18/2019), Disp: 30 tablet, Rfl: 6 .  vitamin E (VITAMIN E) 400 UNIT capsule, Take 2 capsules (800 Units total) by mouth daily. (Patient not taking: Reported on 02/18/2019), Disp: 60 capsule, Rfl:  11  EXAM:  VITALS per patient if applicable:  GENERAL: alert, oriented, appears well and in no acute distress  HEENT: atraumatic, conjunttiva clear, no obvious abnormalities on inspection of external nose and ears  NECK: normal movements of the head and neck  LUNGS: on inspection no signs of respiratory distress, breathing rate appears normal, no obvious gross SOB, gasping or wheezing  CV: no obvious cyanosis  MS: bilateral HN's affecting the DIP joints of  Both index fingers .    Left index finger with large cystic raised area at the moves all visible extremities without noticeable  abnormality  PSYCH/NEURO: pleasant and cooperative, no obvious depression or anxiety, speech and thought processing grossly intact  ASSESSMENT AND PLAN:  Discussed the following assessment and plan:  Synovial cyst of hand - Plan: Ambulatory referral to Hand Surgery, CANCELED: Ambulatory referral to Hand Surgery  Synovial cyst of hand Secondary to OA affecting both hands.  Right hand is more painful thant left  But left index finger has a large cyst on the joint that is becoming problematic due to enlarging size. Referral to Verdell Carmine at [facility] In progress     I discussed the assessment and treatment plan with the patient. The patient was provided an opportunity to ask questions and all were answered. The patient agreed with the plan and demonstrated an understanding of the instructions.   The patient was advised to call back or seek an in-person evaluation if the symptoms worsen or if the condition fails to improve as anticipated.   I provided  25 minutes of non-face-to-face time during this encounter reviewing patient's current problems and post surgeries.  Providing counseling on the above mentioned problems , and coordination  of care .  Crecencio Mc, MD

## 2019-02-22 DIAGNOSIS — M151 Heberden's nodes (with arthropathy): Secondary | ICD-10-CM | POA: Insufficient documentation

## 2019-02-22 DIAGNOSIS — M67449 Ganglion, unspecified hand: Secondary | ICD-10-CM | POA: Insufficient documentation

## 2019-03-08 ENCOUNTER — Other Ambulatory Visit: Payer: Self-pay | Admitting: Internal Medicine

## 2019-03-08 NOTE — Telephone Encounter (Signed)
Refilled: 09/01/2018 Last OV: 02/18/2019 Next OV: not scheduled

## 2019-03-12 ENCOUNTER — Other Ambulatory Visit: Payer: Self-pay | Admitting: Internal Medicine

## 2019-03-12 MED ORDER — ZOLPIDEM TARTRATE ER 6.25 MG PO TBCR: 6.2500 mg | EXTENDED_RELEASE_TABLET | Freq: Every evening | ORAL | 0 refills | Status: DC | PRN

## 2019-03-12 MED ORDER — CITALOPRAM HYDROBROMIDE 40 MG PO TABS: 40.0000 mg | ORAL_TABLET | Freq: Every day | ORAL | 3 refills | Status: DC

## 2019-04-10 ENCOUNTER — Other Ambulatory Visit: Payer: Self-pay | Admitting: Internal Medicine

## 2019-05-06 ENCOUNTER — Encounter: Payer: Self-pay | Admitting: Emergency Medicine

## 2019-05-06 ENCOUNTER — Other Ambulatory Visit: Payer: Self-pay

## 2019-05-06 ENCOUNTER — Ambulatory Visit
Admission: EM | Admit: 2019-05-06 | Discharge: 2019-05-06 | Disposition: A | Discharge status: Home / Self Care | Payer: No Typology Code available for payment source | Attending: Emergency Medicine | Admitting: Emergency Medicine

## 2019-05-06 DIAGNOSIS — L03011 Cellulitis of right finger: Secondary | ICD-10-CM

## 2019-05-06 MED ORDER — CEPHALEXIN 500 MG PO CAPS: 500.0000 mg | ORAL_CAPSULE | Freq: Four times a day (QID) | ORAL | 0 refills | Status: DC

## 2019-05-06 NOTE — ED Triage Notes (Signed)
Patient in today c/o infected cuticle on right index finger x 2 weeks. Patient has been soaking in Epson salt without relief.

## 2019-05-06 NOTE — ED Provider Notes (Signed)
Roderic Palau    CSN: [id number] Arrival date & time: 05/06/19  1418      History   Chief Complaint Chief Complaint  Patient presents with  . infected cuticle    HPI Paula Jensen is a 54 y.o. female.   Patient presents with swelling, redness, tenderness to right index finger side fingernail.  She states she cut her nail too short and has an infection.  She has been treating this at home with leftover Keflex and Epson salt soaks.  She denies fever, chills, weakness, paresthesias, numbness, drainage from the wound, or other symptoms.  The history is provided by the patient.    Past Medical History:  Diagnosis Date  . allergic rhinitis   . Cancer ([place])    skin  . GERD (gastroesophageal reflux disease)     Patient Active Problem List   Diagnosis Date Noted  . Synovial cyst of hand 02/18/2019  . Rectal bleeding 10/10/2018  . Marital conflict [id number]  . Menopausal state 04/15/2018  . Perimenopause 09/12/2017  . Foot pain, left 04/08/2017  . IBS (irritable bowel syndrome) 04/05/2016  . Anxiety, generalized 12/02/2015  . Insomnia 04/05/2015  . Alkaline phosphatase elevation 03/21/2014  . Encounter for preventive health examination 02/17/2012  . Overweight (BMI 25.0-29.9) 10/10/2011  . Allergic rhinitis, seasonal   . GERD (gastroesophageal reflux disease)     Past Surgical History:  Procedure Laterality Date  . DILATION AND CURETTAGE OF UTERUS  June 2012   [place] , for heavy bleeding   . laparoscopy  1996  . TONSILLECTOMY AND ADENOIDECTOMY      OB History   No obstetric history on file.      Home Medications    Prior to Admission medications   Medication Sig Start Date End Date Taking? Authorizing Provider  ALPRAZolam Duanne Moron) 0.25 MG tablet TAKE 1 TABLET BY MOUTH AT BEDTIME AS NEEDED. 02/05/19  Yes Crecencio Mc, MD  citalopram (CELEXA) 40 MG tablet Take 1 tablet (40 mg total) by mouth daily. 03/12/19  Yes Crecencio Mc, MD    clindamycin (CLINDAGEL) 1 % gel Apply topically 2 (two) times daily. 04/13/18  Yes Crecencio Mc, MD  doxycycline (ADOXA) 50 MG tablet Take 1 tablet (50 mg total) by mouth daily. 10/09/18  Yes Crecencio Mc, MD  Magnesium 500 MG CAPS Take 2 capsules by mouth at bedtime.   Yes [provider]  pravastatin (PRAVACHOL) 20 MG tablet Take 1 tablet (20 mg total) by mouth daily. 07/04/18  Yes Crecencio Mc, MD  triamcinolone cream (KENALOG) 0.1 % Apply 1 application topically 2 (two) times daily. 07/09/18  Yes Darlin Priestly, PA-C  vitamin E (VITAMIN E) 400 UNIT capsule Take 2 capsules (800 Units total) by mouth daily. 07/04/18  Yes Crecencio Mc, MD  zolpidem (AMBIEN CR) 6.25 MG CR tablet TAKE 1 TABLET BY MOUTH AT BEDTIME AS NEEDED FOR SLEEP. 04/10/19  Yes Crecencio Mc, MD  cephALEXin (KEFLEX) 500 MG capsule Take 1 capsule (500 mg total) by mouth 4 (four) times daily. 05/06/19   Sharion Balloon, NP  dicyclomine (BENTYL) 20 MG tablet Take 1 tablet (20 mg total) by mouth every 6 (six) hours. 04/04/16 10/03/16  Crecencio Mc, MD    Family History Family History  Problem Relation Age of Onset  . Hypertension Mother   . Atrial fibrillation Father   . Cancer Neg Hx   . Breast cancer Neg Hx     Social History  Social History   Tobacco Use  . Smoking status: Never Smoker  . Smokeless tobacco: Never Used  Substance Use Topics  . Alcohol use: Yes    Alcohol/week: 6.0 standard drinks    Types: 6 Glasses of wine per week  . Drug use: No     Allergies   Patient has no known allergies.   Review of Systems Review of Systems  Constitutional: Negative for chills and fever.  HENT: Negative for ear pain and sore throat.   Eyes: Negative for pain and visual disturbance.  Respiratory: Negative for cough and shortness of breath.   Cardiovascular: Negative for chest pain and palpitations.  Gastrointestinal: Negative for abdominal pain and vomiting.  Genitourinary: Negative for dysuria  and hematuria.  Musculoskeletal: Negative for arthralgias and back pain.  Skin: Positive for wound. Negative for color change and rash.  Neurological: Negative for seizures, syncope, weakness and numbness.  All other systems reviewed and are negative.    Physical Exam Triage Vital Signs ED Triage Vitals  Enc Vitals Group     BP 05/06/19 1423 (!) 138/97     Pulse Rate 05/06/19 1423 (!) 105     Resp 05/06/19 1423 18     Temp 05/06/19 1423 99.3 F (37.4 C)     Temp Source 05/06/19 1423 Oral     SpO2 05/06/19 1423 95 %     Weight 05/06/19 1425 180 lb (81.6 kg)     Height 05/06/19 1425 5\' 8"  (1.727 m)     Head Circumference --      Peak Flow --      Pain Score 05/06/19 1424 3     Pain Loc --      Pain Edu? --      Excl. in [place]? --    No data found.  Updated Vital Signs BP (!) 138/97 (BP Location: Left Arm)   Pulse (!) 105   Temp 99.3 F (37.4 C) (Oral)   Resp 18   Ht 5\' 8"  (1.727 m)   Wt 180 lb (81.6 kg)   LMP 05/05/2016 (Approximate)   SpO2 95%   BMI 27.37 kg/m   Visual Acuity Right Eye Distance:   Left Eye Distance:   Bilateral Distance:    Right Eye Near:   Left Eye Near:    Bilateral Near:     Physical Exam Vitals and nursing note reviewed.  Constitutional:      General: She is not in acute distress.    Appearance: She is well-developed.  HENT:     Head: Normocephalic and atraumatic.     Mouth/Throat:     Mouth: Mucous membranes are moist.     Pharynx: Oropharynx is clear.  Eyes:     Conjunctiva/sclera: Conjunctivae normal.  Cardiovascular:     Rate and Rhythm: Normal rate and regular rhythm.     Heart sounds: No murmur.  Pulmonary:     Effort: Pulmonary effort is normal. No respiratory distress.     Breath sounds: Normal breath sounds.  Abdominal:     Palpations: Abdomen is soft.     Tenderness: There is no abdominal tenderness. There is no guarding or rebound.  Musculoskeletal:        General: Normal range of motion.     Cervical back: Neck  supple.  Skin:    General: Skin is warm and dry.     Capillary Refill: Capillary refill takes less than 2 seconds.     Comments: Right index finger paronychia.  No drainage.  Localized erythema.  Sensation intact, strength 5/5, brisk cap refill.   Neurological:     General: No focal deficit present.     Mental Status: She is alert and oriented to person, place, and time.     Sensory: No sensory deficit.     Motor: No weakness.      UC Treatments / Results  Labs (all labs ordered are listed, but only abnormal results are displayed) Labs Reviewed - No data to display  EKG   Radiology No results found.  Procedures Incision and Drainage  Date/Time: 05/06/2019 3:00 PM Performed by: Sharion Balloon, NP Authorized by: Sharion Balloon, NP   Consent:    Consent obtained:  Verbal   Consent given by:  Patient Location:    Location:  Upper extremity   Upper extremity location:  Finger   Finger location:  R index finger Pre-procedure details:    Skin preparation:  Betadine Anesthesia (see MAR for exact dosages):    Anesthesia method:  Local infiltration and nerve block   Local anesthetic:  Lidocaine 2% w/o epi   Block needle gauge:  27 G   Block anesthetic:  Lidocaine 2% w/o epi   Block injection procedure:  Anatomic landmarks identified, introduced needle, incremental injection, anatomic landmarks palpated and negative aspiration for blood Procedure details:    Incision types:  Stab incision   Scalpel blade:  11   Drainage:  Bloody   Drainage amount:  Moderate   Wound treatment:  Wound left open   Packing materials:  None Post-procedure details:    Patient tolerance of procedure:  Tolerated well, no immediate complications   (including critical care time)  Medications Ordered in UC Medications - No data to display  Initial Impression / Assessment and Plan / UC Course  I have reviewed the triage vital signs and the nursing notes.  Pertinent labs & imaging results that  were available during my care of the patient were reviewed by me and considered in my medical decision making (see chart for details).    Right index finger paronychia.  I&D performed.  Treating with Keflex.  Wound care instructions and signs of infection discussed with patient at length.  Instructed her to return here if she notes signs of infection.  Patient agrees to plan of care.     Final Clinical Impressions(s) / UC Diagnoses   Final diagnoses:  Paronychia of right index finger     Discharge Instructions     Take the antibiotic as directed.    Keep your wound clean and dry.  Wash it gently twice a day with soap and water.  Apply an antibiotic cream and bandage twice a day.    Return here if you see signs of infection, such as increased pain, redness, pus-like drainage, warmth, fever, chills, or other concerning symptoms.       ED Prescriptions    Medication Sig Dispense Auth. Provider   cephALEXin (KEFLEX) 500 MG capsule Take 1 capsule (500 mg total) by mouth 4 (four) times daily. 20 capsule Sharion Balloon, NP     PDMP not reviewed this encounter.   Sharion Balloon, NP 05/06/19 [phone]

## 2019-05-06 NOTE — Discharge Instructions (Signed)
Take the antibiotic as directed.    Keep your wound clean and dry.  Wash it gently twice a day with soap and water.  Apply an antibiotic cream and bandage twice a day.    Return here if you see signs of infection, such as increased pain, redness, pus-like drainage, warmth, fever, chills, or other concerning symptoms.

## 2019-05-06 NOTE — ED Triage Notes (Signed)
Wound dressed with non-stick pad, gauze and coban. Patient tolerated well.

## 2019-06-10 ENCOUNTER — Other Ambulatory Visit: Payer: Self-pay | Admitting: Internal Medicine

## 2019-06-10 DIAGNOSIS — Z1231 Encounter for screening mammogram for malignant neoplasm of breast: Secondary | ICD-10-CM

## 2019-06-11 ENCOUNTER — Other Ambulatory Visit: Payer: Self-pay | Admitting: Internal Medicine

## 2019-06-12 NOTE — Telephone Encounter (Signed)
Refilled: 02/05/2019 Last OV: 02/18/2019 Next OV: 07/19/2019

## 2019-07-03 ENCOUNTER — Ambulatory Visit
Admission: RE | Admit: 2019-07-03 | Discharge: 2019-07-03 | Disposition: A | Discharge status: Home / Self Care | Payer: No Typology Code available for payment source | Source: Ambulatory Visit | Attending: Internal Medicine | Admitting: Internal Medicine

## 2019-07-03 DIAGNOSIS — Z1231 Encounter for screening mammogram for malignant neoplasm of breast: Secondary | ICD-10-CM | POA: Diagnosis not present

## 2019-07-10 ENCOUNTER — Other Ambulatory Visit: Payer: Self-pay | Admitting: Internal Medicine

## 2019-07-19 ENCOUNTER — Encounter: Payer: Self-pay | Admitting: Internal Medicine

## 2019-07-19 ENCOUNTER — Ambulatory Visit (INDEPENDENT_AMBULATORY_CARE_PROVIDER_SITE_OTHER): Payer: No Typology Code available for payment source | Admitting: Internal Medicine

## 2019-07-19 ENCOUNTER — Other Ambulatory Visit: Payer: Self-pay

## 2019-07-19 VITALS — BP 110/68 | HR 94 | Temp 97.5°F | Resp 15 | Ht 68.0 in | Wt 183.2 lb

## 2019-07-19 DIAGNOSIS — K760 Fatty (change of) liver, not elsewhere classified: Secondary | ICD-10-CM

## 2019-07-19 DIAGNOSIS — Z0000 Encounter for general adult medical examination without abnormal findings: Secondary | ICD-10-CM | POA: Diagnosis not present

## 2019-07-19 DIAGNOSIS — R748 Abnormal levels of other serum enzymes: Secondary | ICD-10-CM

## 2019-07-19 DIAGNOSIS — N809 Endometriosis, unspecified: Secondary | ICD-10-CM | POA: Diagnosis not present

## 2019-07-19 DIAGNOSIS — K625 Hemorrhage of anus and rectum: Secondary | ICD-10-CM | POA: Diagnosis not present

## 2019-07-19 DIAGNOSIS — M62838 Other muscle spasm: Secondary | ICD-10-CM

## 2019-07-19 DIAGNOSIS — E7800 Pure hypercholesterolemia, unspecified: Secondary | ICD-10-CM

## 2019-07-19 DIAGNOSIS — Z1211 Encounter for screening for malignant neoplasm of colon: Secondary | ICD-10-CM

## 2019-07-19 LAB — LIPID PANEL
Cholesterol: 177 mg/dL (ref 0–200)
HDL: 84.4 mg/dL (ref >39.00)
LDL Cholesterol: 77 mg/dL (ref 0–99)
NonHDL: 92.34
Total CHOL/HDL Ratio: 2
Triglycerides: 75 mg/dL (ref 0.0–149.0)
VLDL: 15 mg/dL (ref 0.0–40.0)

## 2019-07-19 LAB — COMPREHENSIVE METABOLIC PANEL
ALT: 12 U/L (ref 0–35)
AST: 13 U/L (ref 0–37)
Albumin: 4.4 g/dL (ref 3.5–5.2)
Alkaline Phosphatase: 140 U/L — ABNORMAL HIGH (ref 39–117)
BUN: 24 mg/dL — ABNORMAL HIGH (ref 6–23)
CO2: 27 mEq/L (ref 19–32)
Calcium: 9 mg/dL (ref 8.4–10.5)
Chloride: 103 mEq/L (ref 96–112)
Creatinine, Ser: 0.73 mg/dL (ref 0.40–1.20)
GFR: 82.73 mL/min (ref >60.00)
Glucose, Bld: 103 mg/dL — ABNORMAL HIGH (ref 70–99)
Potassium: 4 mEq/L (ref 3.5–5.1)
Sodium: 139 mEq/L (ref 135–145)
Total Bilirubin: 0.4 mg/dL (ref 0.2–1.2)
Total Protein: 6.7 g/dL (ref 6.0–8.3)

## 2019-07-19 LAB — GAMMA GT: GGT: 22 U/L (ref 7–51)

## 2019-07-19 MED ORDER — TIZANIDINE HCL 2 MG PO CAPS: 2.0000 mg | ORAL_CAPSULE | Freq: Three times a day (TID) | ORAL | 1 refills | Status: DC | PRN

## 2019-07-19 NOTE — Patient Instructions (Addendum)

## 2019-07-19 NOTE — Assessment & Plan Note (Addendum)
Mammogram normal April 2021 Colonoscopy  Not done. Cologuard negative in 2017. Rectal bleeding noted in July 2020 from small hemorrhoids..  Intermittent bleeding  PAP smear normal April 2019  Discussed referral for colonoscopy to [facility] or [facility].

## 2019-07-19 NOTE — Assessment & Plan Note (Signed)
using a probiotic called ProFit ,  Rectal Bleeding has been

## 2019-07-19 NOTE — Progress Notes (Signed)
Patient ID: Paula Jensen, female    DOB: 1965-09-15  Age: 54 y.o. MRN: [id number]  The patient is here for annual preventive exam    Patient has received both doses of the available COVID 19 vaccine without complications.  Patient continues to mask when outside of the home except when walking in yard or at safe distances from others .  Patient denies any change in mood or development of unhealthy behaviors resuting from the pandemic's restriction of activities and socialization.    examination and management of other chronic and acute problems.    This visit occurred during the SARS-CoV-2 public health emergency.  Safety protocols were in place, including screening questions prior to the visit, additional usage of staff PPE, and extensive cleaning of exam room while observing appropriate contact time as indicated for disinfecting solutions.   The risk factors are reflected in the social history.  The roster of all physicians providing medical care to patient - is listed in the Snapshot section of the chart.  Activities of daily living:  The patient is 100% independent in all ADLs: dressing, toileting, feeding as well as independent mobility  Home safety : The patient has smoke detectors in the home. They wear seatbelts.  There are no firearms at home. There is no violence in the home.   There is no risks for hepatitis, STDs or HIV. There is no   history of blood transfusion. They have no travel history to infectious disease endemic areas of the world.  The patient has seen their dentist in the last six month. They have seen their eye doctor in the last year. They do not  have excessive sun exposure. Discussed the need for sun protection: hats, long sleeves and use of sunscreen if there is significant sun exposure.   Diet: the importance of a healthy diet is discussed. They do have a healthy diet.  The benefits of regular aerobic exercise were discussed. She walks 4 times per week ,  20  minutes.   Depression screen: there are no signs or vegative symptoms of depression- irritability, change in appetite, anhedonia, sadness/tearfullness.   The following portions of the patient's history were reviewed and updated as appropriate: allergies, current medications, past family history, past medical history,  past surgical history, past social history  and problem list.  Visual acuity was not assessed per patient preference since she has regular follow up with her ophthalmologist. Hearing and body mass index were assessed and reviewed.   During the course of the visit the patient was educated and counseled about appropriate screening and preventive services including : fall prevention , diabetes screening, nutrition counseling, colorectal cancer screening, and recommended immunizations.    CC: The primary encounter diagnosis was Pure hypercholesterolemia. Diagnoses of Encounter for preventive health examination, Endometriosis determined by laparoscopy, Rectal bleeding, Fatty liver, Elevated alkaline phosphatase level, Neck muscle spasm, Colon cancer screening, and Alkaline phosphatase elevation were also pertinent to this visit.   Neck pain ,  Getting massages,  Took 1000 mg magnesium which caused diarrhea. \  History Paula Jensen has a past medical history of allergic rhinitis, Cancer ([place]), and GERD (gastroesophageal reflux disease).   She has a past surgical history that includes Dilation and curettage of uterus (June 2012); Tonsillectomy and adenoidectomy; and laparoscopy (1996).   Her family history includes Atrial fibrillation in her father; Hypertension in her mother.She reports that she has never smoked. She has never used smokeless tobacco. She reports current alcohol use of about 6.0  standard drinks of alcohol per week. She reports that she does not use drugs.  Outpatient Medications Prior to Visit  Medication Sig Dispense Refill  . ALPRAZolam (XANAX) 0.25 MG tablet TAKE 1  TABLET BY MOUTH AT BEDTIME AS NEEDED. 30 tablet 1  . citalopram (CELEXA) 40 MG tablet Take 1 tablet (40 mg total) by mouth daily. 90 tablet 3  . clindamycin (CLINDAGEL) 1 % gel Apply topically 2 (two) times daily. 30 g 5  . Magnesium 500 MG CAPS Take 2 capsules by mouth at bedtime.    . pravastatin (PRAVACHOL) 20 MG tablet TAKE 1 TABLET BY MOUTH DAILY. 90 tablet 3  . triamcinolone cream (KENALOG) 0.1 % Apply 1 application topically 2 (two) times daily. 30 g 0  . vitamin E (VITAMIN E) 400 UNIT capsule Take 2 capsules (800 Units total) by mouth daily. 60 capsule 11  . zolpidem (AMBIEN CR) 6.25 MG CR tablet TAKE 1 TABLET BY MOUTH AT BEDTIME AS NEEDED FOR SLEEP. 30 tablet 3  . doxycycline (ADOXA) 50 MG tablet Take 1 tablet (50 mg total) by mouth daily. (Patient not taking: Reported on 07/19/2019) 30 tablet 6  . cephALEXin (KEFLEX) 500 MG capsule Take 1 capsule (500 mg total) by mouth 4 (four) times daily. 20 capsule 0   No facility-administered medications prior to visit.    Review of Systems   Patient denies headache, fevers, malaise, unintentional weight loss, skin rash, eye pain, sinus congestion and sinus pain, sore throat, dysphagia,  hemoptysis , cough, dyspnea, wheezing, chest pain, palpitations, orthopnea, edema, abdominal pain, nausea, melena, diarrhea, constipation, flank pain, dysuria, hematuria, urinary  Frequency, nocturia, numbness, tingling, seizures,  Focal weakness, Loss of consciousness,  Tremor, insomnia, depression, anxiety, and suicidal ideation.      Objective:  BP 110/68 (BP Location: Left Arm, Patient Position: Sitting, Cuff Size: Normal)   Pulse 94   Temp (!) 97.5 F (36.4 C) (Temporal)   Resp 15   Ht 5\' 8"  (1.727 m)   Wt 183 lb 3.2 oz (83.1 kg)   LMP 05/05/2016 (Approximate)   SpO2 96%   BMI 27.86 kg/m   Physical Exam  General appearance: alert, cooperative and appears stated age Ears: normal TM's and external ear canals both ears Throat: lips, mucosa, and  tongue normal; teeth and gums normal Neck: no adenopathy, no carotid bruit, supple, symmetrical, trachea midline and thyroid not enlarged, symmetric, no tenderness/mass/nodules Back: symmetric, no curvature. ROM normal. No CVA tenderness. Lungs: clear to auscultation bilaterally Heart: regular rate and rhythm, S1, S2 normal, no murmur, click, rub or gallop Abdomen: soft, non-tender; bowel sounds normal; no masses,  no organomegaly Pulses: 2+ and symmetric Skin: Skin color, texture, turgor normal. No rashes or lesions Lymph nodes: Cervical, supraclavicular, and axillary nodes normal.  Assessment & Plan:   Problem List Items Addressed This Visit      Unprioritized   Alkaline phosphatase elevation    Persistent,  Due to fatty liver and alcohol use.  Addressed today.  Continue pravastatin and Vit E  Lab Results  Component Value Date   ALT 12 07/19/2019   AST 13 07/19/2019   ALKPHOS 140 (H) 07/19/2019   BILITOT 0.4 07/19/2019        Encounter for preventive health examination    Mammogram normal April 2021 Colonoscopy  Not done. Cologuard negative in 2017. Rectal bleeding noted in July 2020 from small hemorrhoids..  Intermittent bleeding  PAP smear normal April 2019  Discussed referral for colonoscopy to [facility] or  [place].        Endometriosis determined by laparoscopy   Rectal bleeding    using a probiotic called ProFit ,  Rectal Bleeding has been       Other Visit Diagnoses    Pure hypercholesterolemia    -  Primary   Relevant Orders   Lipid panel (Completed)   Thyroid Panel With TSH (Completed)   Fatty liver       Elevated alkaline phosphatase level       Relevant Orders   Comprehensive metabolic panel (Completed)   Gamma GT (Completed)   Neck muscle spasm       Relevant Medications   tizanidine (ZANAFLEX) 2 MG capsule   Colon cancer screening       Relevant Orders   Ambulatory referral to Gastroenterology      I have discontinued Tyasia G. Agnes "Lori"'s  cephALEXin. I am also having her start on tizanidine. Additionally, I am having her maintain her Magnesium, clindamycin, vitamin E, triamcinolone cream, doxycycline, citalopram, zolpidem, ALPRAZolam, and pravastatin.  Meds ordered this encounter  Medications  . tizanidine (ZANAFLEX) 2 MG capsule    Sig: Take 1 capsule (2 mg total) by mouth 3 (three) times daily as needed for muscle spasms.    Dispense:  60 capsule    Refill:  1    Medications Discontinued During This Encounter  Medication Reason  . cephALEXin (KEFLEX) 500 MG capsule Completed Course    Follow-up: No follow-ups on file.   Crecencio Mc, MD

## 2019-07-20 LAB — THYROID PANEL WITH TSH
Free Thyroxine Index: 1.9 (ref 1.4–3.8)
T3 Uptake: 35 % (ref 22–35)
T4, Total: 5.3 ug/dL (ref 5.1–11.9)
TSH: 0.92 mIU/L

## 2019-07-21 NOTE — Assessment & Plan Note (Signed)
Persistent,  Due to fatty liver and alcohol use.  Addressed today.  Continue pravastatin and Vit E  Lab Results  Component Value Date   ALT 12 07/19/2019   AST 13 07/19/2019   ALKPHOS 140 (H) 07/19/2019   BILITOT 0.4 07/19/2019

## 2019-07-22 DIAGNOSIS — L989 Disorder of the skin and subcutaneous tissue, unspecified: Secondary | ICD-10-CM

## 2019-07-23 ENCOUNTER — Other Ambulatory Visit: Payer: Self-pay | Admitting: Internal Medicine

## 2019-07-23 DIAGNOSIS — N952 Postmenopausal atrophic vaginitis: Secondary | ICD-10-CM | POA: Insufficient documentation

## 2019-07-23 MED ORDER — ESTRADIOL 0.1 MG/GM VA CREA: TOPICAL_CREAM | VAGINAL | 12 refills | Status: DC

## 2019-07-25 ENCOUNTER — Other Ambulatory Visit: Payer: Self-pay

## 2019-07-25 ENCOUNTER — Telehealth (INDEPENDENT_AMBULATORY_CARE_PROVIDER_SITE_OTHER): Payer: Self-pay | Admitting: Gastroenterology

## 2019-07-25 DIAGNOSIS — Z1211 Encounter for screening for malignant neoplasm of colon: Secondary | ICD-10-CM

## 2019-07-25 NOTE — Progress Notes (Signed)
Gastroenterology Pre-Procedure Review  Request Date: 08/23/19 Requesting Physician: Dr. Allen Norris  PATIENT REVIEW QUESTIONS: The patient responded to the following health history questions as indicated:    1. Are you having any GI issues? no 2. Do you have a personal history of Polyps? no 3. Do you have a family history of Colon Cancer or Polyps? no 4. Diabetes Mellitus? no 5. Joint replacements in the past 12 months?no 6. Major health problems in the past 3 months?no 7. Any artificial heart valves, MVP, or defibrillator?no    MEDICATIONS & ALLERGIES:    Patient reports the following regarding taking any anticoagulation/antiplatelet therapy:   Plavix, Coumadin, Eliquis, Xarelto, Lovenox, Pradaxa, Brilinta, or Effient? no Aspirin? no  Patient confirms/reports the following medications:  Current Outpatient Medications  Medication Sig Dispense Refill  . ALPRAZolam (XANAX) 0.25 MG tablet TAKE 1 TABLET BY MOUTH AT BEDTIME AS NEEDED. 30 tablet 1  . citalopram (CELEXA) 40 MG tablet Take 1 tablet (40 mg total) by mouth daily. 90 tablet 3  . clindamycin (CLINDAGEL) 1 % gel Apply topically 2 (two) times daily. 30 g 5  . estradiol (ESTRACE) 0.1 MG/GM vaginal cream Insert 1 gram intravaginally at bedtime daily for 2 weeks,  Then reduce use to twice weekly thereafter 42.5 g 12  . pravastatin (PRAVACHOL) 20 MG tablet TAKE 1 TABLET BY MOUTH DAILY. 90 tablet 3  . tizanidine (ZANAFLEX) 2 MG capsule Take 1 capsule (2 mg total) by mouth 3 (three) times daily as needed for muscle spasms. 60 capsule 1  . triamcinolone cream (KENALOG) 0.1 % Apply 1 application topically 2 (two) times daily. 30 g 0  . vitamin E (VITAMIN E) 400 UNIT capsule Take 2 capsules (800 Units total) by mouth daily. 60 capsule 11  . zolpidem (AMBIEN CR) 6.25 MG CR tablet TAKE 1 TABLET BY MOUTH AT BEDTIME AS NEEDED FOR SLEEP. 30 tablet 3  . doxycycline (ADOXA) 50 MG tablet Take 1 tablet (50 mg total) by mouth daily. (Patient not taking:  Reported on 07/19/2019) 30 tablet 6  . Magnesium 500 MG CAPS Take 2 capsules by mouth at bedtime.     No current facility-administered medications for this visit.    Patient confirms/reports the following allergies:  No Known Allergies  No orders of the defined types were placed in this encounter.   AUTHORIZATION INFORMATION Primary Insurance: 1D#: Group #:  Secondary Insurance: 1D#: Group #:  SCHEDULE INFORMATION: Date: Friday 08/23/19 Time: Location:[facility]

## 2019-08-14 ENCOUNTER — Encounter: Payer: Self-pay | Admitting: Gastroenterology

## 2019-08-14 ENCOUNTER — Other Ambulatory Visit: Payer: Self-pay | Admitting: Internal Medicine

## 2019-08-14 ENCOUNTER — Other Ambulatory Visit: Payer: Self-pay

## 2019-08-14 NOTE — Telephone Encounter (Signed)
Refill request for Paula Jensen, last seen 07-19-19, last filled 04-10-19.  Please advise.

## 2019-08-21 ENCOUNTER — Other Ambulatory Visit: Payer: Self-pay

## 2019-08-21 ENCOUNTER — Other Ambulatory Visit
Admission: RE | Admit: 2019-08-21 | Discharge: 2019-08-21 | Disposition: A | Discharge status: Home / Self Care | Payer: No Typology Code available for payment source | Source: Ambulatory Visit | Attending: Gastroenterology | Admitting: Gastroenterology

## 2019-08-21 DIAGNOSIS — Z01812 Encounter for preprocedural laboratory examination: Secondary | ICD-10-CM | POA: Insufficient documentation

## 2019-08-21 DIAGNOSIS — Z20822 Contact with and (suspected) exposure to covid-19: Secondary | ICD-10-CM | POA: Insufficient documentation

## 2019-08-21 LAB — SARS CORONAVIRUS 2 (TAT 6-24 HRS): SARS Coronavirus 2: NEGATIVE

## 2019-08-21 NOTE — Discharge Instructions (Signed)
General Anesthesia, Adult, Care After This sheet gives you information about how to care for yourself after your procedure. Your health care provider may also give you more specific instructions. If you have problems or questions, contact your health care provider. What can I expect after the procedure? After the procedure, the following side effects are common:  Pain or discomfort at the IV site.  Nausea.  Vomiting.  Sore throat.  Trouble concentrating.  Feeling cold or chills.  Weak or tired.  Sleepiness and fatigue.  Soreness and body aches. These side effects can affect parts of the body that were not involved in surgery. Follow these instructions at home:  For at least 24 hours after the procedure:  Have a responsible adult stay with you. It is important to have someone help care for you until you are awake and alert.  Rest as needed.  Do not: ? Participate in activities in which you could fall or become injured. ? Drive. ? Use heavy machinery. ? Drink alcohol. ? Take sleeping pills or medicines that cause drowsiness. ? Make important decisions or sign legal documents. ? Take care of children on your own. Eating and drinking  Follow any instructions from your health care provider about eating or drinking restrictions.  When you feel hungry, start by eating small amounts of foods that are soft and easy to digest (bland), such as toast. Gradually return to your regular diet.  Drink enough fluid to keep your urine pale yellow.  If you vomit, rehydrate by drinking water, juice, or clear broth. General instructions  If you have sleep apnea, surgery and certain medicines can increase your risk for breathing problems. Follow instructions from your health care provider about wearing your sleep device: ? Anytime you are sleeping, including during daytime naps. ? While taking prescription pain medicines, sleeping medicines, or medicines that make you drowsy.  Return to  your normal activities as told by your health care provider. Ask your health care provider what activities are safe for you.  Take over-the-counter and prescription medicines only as told by your health care provider.  If you smoke, do not smoke without supervision.  Keep all follow-up visits as told by your health care provider. This is important. Contact a health care provider if:  You have nausea or vomiting that does not get better with medicine.  You cannot eat or drink without vomiting.  You have pain that does not get better with medicine.  You are unable to pass urine.  You develop a skin rash.  You have a fever.  You have redness around your IV site that gets worse. Get help right away if:  You have difficulty breathing.  You have chest pain.  You have blood in your urine or stool, or you vomit blood. Summary  After the procedure, it is common to have a sore throat or nausea. It is also common to feel tired.  Have a responsible adult stay with you for the first 24 hours after general anesthesia. It is important to have someone help care for you until you are awake and alert.  When you feel hungry, start by eating small amounts of foods that are soft and easy to digest (bland), such as toast. Gradually return to your regular diet.  Drink enough fluid to keep your urine pale yellow.  Return to your normal activities as told by your health care provider. Ask your health care provider what activities are safe for you. This information is not   intended to replace advice given to you by your health care provider. Make sure you discuss any questions you have with your health care provider. Document Revised: 03/10/2017 Document Reviewed: 10/21/2016 Elsevier Patient Education  2020 [employer]  

## 2019-08-23 ENCOUNTER — Encounter: Payer: Self-pay | Admitting: Gastroenterology

## 2019-08-23 ENCOUNTER — Ambulatory Visit
Admission: RE | Admit: 2019-08-23 | Discharge: 2019-08-23 | Disposition: A | Discharge status: Home / Self Care | Payer: No Typology Code available for payment source | Attending: Gastroenterology | Admitting: Gastroenterology

## 2019-08-23 ENCOUNTER — Ambulatory Visit: Payer: No Typology Code available for payment source | Admitting: Anesthesiology

## 2019-08-23 ENCOUNTER — Encounter
Admission: RE | Disposition: A | Discharge status: Home / Self Care | Payer: Self-pay | Source: Home / Self Care | Attending: Gastroenterology

## 2019-08-23 DIAGNOSIS — K219 Gastro-esophageal reflux disease without esophagitis: Secondary | ICD-10-CM | POA: Diagnosis not present

## 2019-08-23 DIAGNOSIS — K648 Other hemorrhoids: Secondary | ICD-10-CM | POA: Diagnosis not present

## 2019-08-23 DIAGNOSIS — F17210 Nicotine dependence, cigarettes, uncomplicated: Secondary | ICD-10-CM | POA: Diagnosis not present

## 2019-08-23 DIAGNOSIS — Z79899 Other long term (current) drug therapy: Secondary | ICD-10-CM | POA: Diagnosis not present

## 2019-08-23 DIAGNOSIS — Z1211 Encounter for screening for malignant neoplasm of colon: Secondary | ICD-10-CM | POA: Diagnosis not present

## 2019-08-23 HISTORY — DX: Family history of other specified conditions: Z84.89

## 2019-08-23 HISTORY — DX: Presence of spectacles and contact lenses: Z97.3

## 2019-08-23 HISTORY — PX: COLONOSCOPY WITH PROPOFOL: SHX5780

## 2019-08-23 SURGERY — COLONOSCOPY WITH PROPOFOL
Anesthesia: General | Site: Rectum

## 2019-08-23 MED ORDER — SODIUM CHLORIDE 0.9 % IV SOLN: INTRAVENOUS | Status: DC

## 2019-08-23 MED ORDER — ACETAMINOPHEN 160 MG/5ML PO SOLN: 325.0000 mg | Freq: Once | ORAL | Status: DC

## 2019-08-23 MED ORDER — LIDOCAINE HCL (CARDIAC) PF 100 MG/5ML IV SOSY
PREFILLED_SYRINGE | INTRAVENOUS | Status: DC | PRN
  Administered 2019-08-23: 50 mg via INTRAVENOUS

## 2019-08-23 MED ORDER — LACTATED RINGERS IV SOLN
10.0000 mL/h | INTRAVENOUS | Status: DC
  Administered 2019-08-23: 10 mL/h via INTRAVENOUS

## 2019-08-23 MED ORDER — ACETAMINOPHEN 325 MG PO TABS: 325.0000 mg | ORAL_TABLET | Freq: Once | ORAL | Status: DC

## 2019-08-23 MED ORDER — PROPOFOL 10 MG/ML IV BOLUS
INTRAVENOUS | Status: DC | PRN
  Administered 2019-08-23: 100 mg via INTRAVENOUS
  Administered 2019-08-23 (×2): 50 mg via INTRAVENOUS

## 2019-08-23 MED ORDER — STERILE WATER FOR IRRIGATION IR SOLN
Status: DC | PRN
  Administered 2019-08-23: 100 mL

## 2019-08-23 SURGICAL SUPPLY — 7 items
GOWN CVR UNV OPN BCK APRN NK (MISCELLANEOUS) ×2 IMPLANT
GOWN ISOL THUMB LOOP REG UNIV (MISCELLANEOUS) ×4
KIT ENDO PROCEDURE OLY (KITS) ×3 IMPLANT
MANIFOLD NEPTUNE II (INSTRUMENTS) ×3 IMPLANT
SNARE SHORT THROW 13M SML OVAL (MISCELLANEOUS) IMPLANT
TRAP ETRAP POLY (MISCELLANEOUS) IMPLANT
WATER STERILE IRR 250ML POUR (IV SOLUTION) ×3 IMPLANT

## 2019-08-23 NOTE — Anesthesia Preprocedure Evaluation (Signed)
Anesthesia Evaluation  Patient identified by MRN, date of birth, ID band Patient awake    Reviewed: Allergy & Precautions, H&P , NPO status , Patient's Chart, lab work & pertinent test results  Airway Mallampati: II  TM Distance: >3 FB Neck ROM: full    Dental no notable dental hx.    Pulmonary Current Smoker and Patient abstained from smoking.,    Pulmonary exam normal breath sounds clear to auscultation       Cardiovascular Normal cardiovascular exam Rhythm:regular Rate:Normal     Neuro/Psych PSYCHIATRIC DISORDERS    GI/Hepatic GERD  ,  Endo/Other    Renal/GU      Musculoskeletal   Abdominal   Peds  Hematology   Anesthesia Other Findings   Reproductive/Obstetrics                             Anesthesia Physical Anesthesia Plan  ASA: II  Anesthesia Plan: General   Post-op Pain Management:    Induction: Intravenous  PONV Risk Score and Plan: 3 and Treatment may vary due to age or medical condition, TIVA and Propofol infusion  Airway Management Planned: Natural Airway  Additional Equipment:   Intra-op Plan:   Post-operative Plan:   Informed Consent: I have reviewed the patients History and Physical, chart, labs and discussed the procedure including the risks, benefits and alternatives for the proposed anesthesia with the patient or authorized representative who has indicated his/her understanding and acceptance.     Dental Advisory Given  Plan Discussed with: CRNA  Anesthesia Plan Comments:         Anesthesia Quick Evaluation

## 2019-08-23 NOTE — Transfer of Care (Signed)
Immediate Anesthesia Transfer of Care Note  Patient: Paula Jensen  Procedure(s) Performed: COLONOSCOPY WITH PROPOFOL (N/A Rectum)  Patient Location: PACU  Anesthesia Type: General  Level of Consciousness: awake, alert  and patient cooperative  Airway and Oxygen Therapy: Patient Spontanous Breathing and Patient connected to supplemental oxygen  Post-op Assessment: Post-op Vital signs reviewed, Patient's Cardiovascular Status Stable, Respiratory Function Stable, Patent Airway and No signs of Nausea or vomiting  Post-op Vital Signs: Reviewed and stable  Complications: No apparent anesthesia complications

## 2019-08-23 NOTE — H&P (Signed)
Paula Lame, MD [address] Paula [place], [place] [address] Phone: [phone] Fax : [phone]  Primary Care Physician:  Crecencio Mc, MD Primary Gastroenterologist:  Dr. Allen Norris  Pre-Procedure History & Physical: HPI:  Paula Jensen is a 54 y.o. female is here for a screening colonoscopy.   Past Medical History:  Diagnosis Date  . allergic rhinitis   . Cancer ([place])    skin  . Family history of adverse reaction to anesthesia    Daughter - PONV  . GERD (gastroesophageal reflux disease)   . Wears contact lenses     Past Surgical History:  Procedure Laterality Date  . DILATION AND CURETTAGE OF UTERUS  June 2012   [place] , for heavy bleeding   . laparoscopy  1996  . TONSILLECTOMY AND ADENOIDECTOMY      Prior to Admission medications   Medication Sig Start Date End Date Taking? Authorizing Provider  ALPRAZolam Duanne Moron) 0.25 MG tablet TAKE 1 TABLET BY MOUTH AT BEDTIME AS NEEDED. 06/12/19  Yes Crecencio Mc, MD  citalopram (CELEXA) 40 MG tablet Take 1 tablet (40 mg total) by mouth daily. 03/12/19  Yes Crecencio Mc, MD  clindamycin (CLINDAGEL) 1 % gel Apply topically 2 (two) times daily. 04/13/18  Yes Crecencio Mc, MD  estradiol (ESTRACE) 0.1 MG/GM vaginal cream Insert 1 gram intravaginally at bedtime daily for 2 weeks,  Then reduce use to twice weekly thereafter 07/23/19  Yes Crecencio Mc, MD  Magnesium 500 MG CAPS Take 2 capsules by mouth at bedtime.   Yes [provider]  pravastatin (PRAVACHOL) 20 MG tablet TAKE 1 TABLET BY MOUTH DAILY. 07/10/19  Yes Crecencio Mc, MD  tizanidine (ZANAFLEX) 2 MG capsule Take 1 capsule (2 mg total) by mouth 3 (three) times daily as needed for muscle spasms. 07/19/19  Yes Crecencio Mc, MD  triamcinolone cream (KENALOG) 0.1 % Apply 1 application topically 2 (two) times daily. 07/09/18  Yes Darlin Priestly, PA-C  vitamin E (VITAMIN E) 400 UNIT capsule Take 2 capsules (800 Units total) by mouth daily. 07/04/18   Yes Crecencio Mc, MD  zolpidem (AMBIEN CR) 6.25 MG CR tablet TAKE 1 TABLET BY MOUTH AT BEDTIME AS NEEDED FOR SLEEP. 08/14/19  Yes Crecencio Mc, MD  doxycycline (ADOXA) 50 MG tablet Take 1 tablet (50 mg total) by mouth daily. Patient not taking: Reported on 07/19/2019 10/09/18   Crecencio Mc, MD  dicyclomine (BENTYL) 20 MG tablet Take 1 tablet (20 mg total) by mouth every 6 (six) hours. 04/04/16 10/03/16  Crecencio Mc, MD    Allergies as of 07/25/2019  . (No Known Allergies)    Family History  Problem Relation Age of Onset  . Hypertension Mother   . Atrial fibrillation Father   . Cancer Neg Hx   . Breast cancer Neg Hx     Social History   Socioeconomic History  . Marital status: Married    Spouse name: Not on file  . Number of children: Not on file  . Years of education: Not on file  . Highest education level: Not on file  Occupational History  . Not on file  Tobacco Use  . Smoking status: Light Tobacco Smoker  . Smokeless tobacco: Never Used  . Tobacco comment: has occasional "social" cigarette  Substance and Sexual Activity  . Alcohol use: Yes    Alcohol/week: 10.0 standard drinks    Types: 10 Glasses of wine per week  .  Drug use: No  . Sexual activity: Yes  Other Topics Concern  . Not on file  Social History Narrative  . Not on file   Social Determinants of Health   Financial Resource Strain:   . Difficulty of Paying Living Expenses:   Food Insecurity:   . Worried About Charity fundraiser in the Last Year:   . Arboriculturist in the Last Year:   Transportation Needs:   . Film/video editor (Medical):   Paula Jensen Lack of Transportation (Non-Medical):   Physical Activity:   . Days of Exercise per Week:   . Minutes of Exercise per Session:   Stress:   . Feeling of Stress :   Social Connections:   . Frequency of Communication with Friends and Family:   . Frequency of Social Gatherings with Friends and Family:   . Attends Religious Services:   . Active  Member of Clubs or Organizations:   . Attends Archivist Meetings:   Paula Jensen Marital Status:   Intimate Partner Violence:   . Fear of Current or Ex-Partner:   . Emotionally Abused:   Paula Jensen Physically Abused:   . Sexually Abused:     Review of Systems: See HPI, otherwise negative ROS  Physical Exam: BP (!) 117/98   Pulse 79   Temp (!) 97.5 F (36.4 C) (Temporal)   Resp 16   Ht 5\' 8"  (1.727 m)   Wt 80.7 kg   LMP 05/05/2016 (Approximate)   SpO2 97%   BMI 27.06 kg/m  General:   Alert,  pleasant and cooperative in NAD Head:  Normocephalic and atraumatic. Neck:  Supple; no masses or thyromegaly. Lungs:  Clear throughout to auscultation.    Heart:  Regular rate and rhythm. Abdomen:  Soft, nontender and nondistended. Normal bowel sounds, without guarding, and without rebound.   Neurologic:  Alert and  oriented x4;  grossly normal neurologically.  Impression/Plan: Paula Jensen is now here to undergo a screening colonoscopy.  Risks, benefits, and alternatives regarding colonoscopy have been reviewed with the patient.  Questions have been answered.  All parties agreeable.

## 2019-08-23 NOTE — Op Note (Signed)
[facility] Gastroenterology Patient Name: Paula Jensen Procedure Date: 08/23/2019 8:55 AM MRN: [id number] Account #: [id number] Date of Birth: 21-Oct-1965 Admit Type: Outpatient Age: 54 Room: [facility] OR ROOM 01 Gender: Female Note Status: Finalized Procedure:             Colonoscopy Indications:           Screening for colorectal malignant neoplasm Providers:             [name] MD, MD Referring MD:          Deborra Medina, MD (Referring MD) Medicines:             Propofol per Anesthesia Complications:         No immediate complications. Procedure:             Pre-Anesthesia Assessment:                        - Prior to the procedure, a History and Physical was                         performed, and patient medications and allergies were                         reviewed. The patient's tolerance of previous                         anesthesia was also reviewed. The risks and benefits                         of the procedure and the sedation options and risks                         were discussed with the patient. All questions were                         answered, and informed consent was obtained. Prior                         Anticoagulants: The patient has taken no previous                         anticoagulant or antiplatelet agents. ASA Grade                         Assessment: II - A patient with mild systemic disease.                         After reviewing the risks and benefits, the patient                         was deemed in satisfactory condition to undergo the                         procedure.                        After obtaining informed consent, the colonoscope was  passed under direct vision. Throughout the procedure,                         the patient's blood pressure, pulse, and oxygen                         saturations were monitored continuously. The                         Colonoscope was introduced through the  anus and                         advanced to the the cecum, identified by appendiceal                         orifice and ileocecal valve. The colonoscopy was                         performed without difficulty. The patient tolerated                         the procedure well. The quality of the bowel                         preparation was fair. Findings:      Hemorrhoids were found on perianal exam.      Non-bleeding internal hemorrhoids were found during retroflexion. The       hemorrhoids were Grade I (internal hemorrhoids that do not prolapse).      The exam was otherwise without abnormality. Impression:            - Preparation of the colon was fair.                        - Hemorrhoids found on perianal exam.                        - Non-bleeding internal hemorrhoids.                        - The examination was otherwise normal.                        - No specimens collected. Recommendation:        - Discharge patient to home.                        - Resume previous diet.                        - Continue present medications.                        - Repeat colonoscopy in 10 years for screening unless                         any change in family history or lower GI problems. Procedure Code(s):     --- Professional ---                        [phone], Colonoscopy,  flexible; diagnostic, including                         collection of specimen(s) by brushing or washing, when                         performed (separate procedure) Diagnosis Code(s):     --- Professional ---                        Z12.11, Encounter for screening for malignant neoplasm                         of colon CPT copyright 2019 [employer]. All rights reserved. The codes documented in this report are preliminary and upon coder review may  be revised to meet current compliance requirements. [name] MD, MD 08/23/2019 9:18:10 AM This report has been signed electronically. Number of Addenda:  0 Note Initiated On: 08/23/2019 8:55 AM Scope Withdrawal Time: 0 hours 12 minutes 28 seconds  Total Procedure Duration: 0 hours 14 minutes 51 seconds  Estimated Blood Loss:  Estimated blood loss: none.      [facility]

## 2019-08-23 NOTE — Anesthesia Procedure Notes (Signed)
Procedure Name: General with mask airway Date/Time: 08/23/2019 9:04 AM Performed by: Jeannene Patella, CRNA Pre-anesthesia Checklist: Timeout performed, Patient being monitored, Suction available, Emergency Drugs available and Patient identified Patient Re-evaluated:Patient Re-evaluated prior to induction Oxygen Delivery Method: Nasal cannula

## 2019-08-23 NOTE — Anesthesia Postprocedure Evaluation (Signed)
Anesthesia Post Note  Patient: Paula Jensen  Procedure(s) Performed: COLONOSCOPY WITH PROPOFOL (N/A Rectum)     Patient location during evaluation: PACU Anesthesia Type: General Level of consciousness: awake and alert and oriented Pain management: satisfactory to patient Vital Signs Assessment: post-procedure vital signs reviewed and stable Respiratory status: spontaneous breathing, nonlabored ventilation and respiratory function stable Cardiovascular status: blood pressure returned to baseline and stable Postop Assessment: Adequate PO intake and No signs of nausea or vomiting Anesthetic complications: no    Raliegh Ip

## 2019-09-17 ENCOUNTER — Other Ambulatory Visit: Payer: Self-pay | Admitting: Internal Medicine

## 2019-09-18 LAB — HEPATIC FUNCTION PANEL
ALT: 14 IU/L (ref 0–32)
AST: 14 IU/L (ref 0–40)
Albumin: 4.3 g/dL (ref 3.8–4.9)
Alkaline Phosphatase: 113 IU/L (ref 48–121)
Bilirubin Total: 0.3 mg/dL (ref 0.0–1.2)
Bilirubin, Direct: 0.09 mg/dL (ref 0.00–0.40)
Total Protein: 6.7 g/dL (ref 6.0–8.5)

## 2019-10-07 DIAGNOSIS — M5412 Radiculopathy, cervical region: Secondary | ICD-10-CM

## 2019-10-07 NOTE — Telephone Encounter (Signed)
Please let pt know that Dr Derrel Nip is not in the office today.  Does she need to xray today?

## 2019-10-08 ENCOUNTER — Other Ambulatory Visit: Payer: Self-pay

## 2019-10-08 ENCOUNTER — Ambulatory Visit
Admission: RE | Admit: 2019-10-08 | Discharge: 2019-10-08 | Disposition: A | Discharge status: Home / Self Care | Payer: No Typology Code available for payment source | Attending: Internal Medicine | Admitting: Internal Medicine

## 2019-10-08 ENCOUNTER — Ambulatory Visit
Admission: RE | Admit: 2019-10-08 | Discharge: 2019-10-08 | Disposition: A | Discharge status: Home / Self Care | Payer: No Typology Code available for payment source | Source: Ambulatory Visit | Attending: Internal Medicine | Admitting: Internal Medicine

## 2019-10-08 DIAGNOSIS — M5412 Radiculopathy, cervical region: Secondary | ICD-10-CM | POA: Insufficient documentation

## 2019-10-10 ENCOUNTER — Encounter: Payer: Self-pay | Admitting: Internal Medicine

## 2019-10-10 DIAGNOSIS — M47812 Spondylosis without myelopathy or radiculopathy, cervical region: Secondary | ICD-10-CM | POA: Insufficient documentation

## 2019-10-10 DIAGNOSIS — M5030 Other cervical disc degeneration, unspecified cervical region: Secondary | ICD-10-CM

## 2019-10-11 ENCOUNTER — Other Ambulatory Visit: Payer: Self-pay | Admitting: Internal Medicine

## 2019-11-06 ENCOUNTER — Ambulatory Visit: Payer: No Typology Code available for payment source | Attending: Internal Medicine

## 2019-11-06 ENCOUNTER — Other Ambulatory Visit: Payer: Self-pay

## 2019-11-06 DIAGNOSIS — M542 Cervicalgia: Secondary | ICD-10-CM | POA: Insufficient documentation

## 2019-11-06 NOTE — Patient Instructions (Signed)
Access Code: [email or web address] URL: [email or web address] Date: 11/06/2019 Prepared by: Joneen Boers  Exercises Supine Chin Tuck - 1 x daily - 7 x weekly - 3 sets - 10 reps - 5 hold

## 2019-11-06 NOTE — Therapy (Signed)
[facility] [address] [place], [address] Phone: [phone]   Fax:  [phone]  Physical Therapy Evaluation  Patient Details  Name: Paula Jensen MRN: [id number] Date of Birth: May 21, 1965 Referring Provider (PT): Deborra Medina, MD   Encounter Date: 11/06/2019   PT End of Session - 11/06/19 1735    Visit Number 1    Number of Visits 13    Date for PT Re-Evaluation 12/19/19    PT Start Time 4742    PT Stop Time 1837    PT Time Calculation (min) 61 min    Activity Tolerance Patient tolerated treatment well    Behavior During Therapy [facility] for tasks assessed/performed           Past Medical History:  Diagnosis Date  . allergic rhinitis   . Cancer ([place])    skin  . Family history of adverse reaction to anesthesia    Daughter - PONV  . GERD (gastroesophageal reflux disease)   . Wears contact lenses     Past Surgical History:  Procedure Laterality Date  . COLONOSCOPY WITH PROPOFOL N/A 08/23/2019   Procedure: COLONOSCOPY WITH PROPOFOL;  Surgeon: [name], MD;  Location: [facility];  Service: Endoscopy;  Laterality: N/A;  priority 4  . DILATION AND CURETTAGE OF UTERUS  June 2012   [place] , for heavy bleeding   . laparoscopy  1996  . TONSILLECTOMY AND ADENOIDECTOMY      There were no vitals filed for this visit.    Subjective Assessment - 11/06/19 1740    Subjective L lateral neck: 2/10 currently at rest, 6/10 at most for the past 6 months    Pertinent History Neck pain with radiculopathy. Pain began about 6 months ago. Has beend massaging her L upper trap. Pain mainly on L posterior lateral neck. Did a trigger point injection which helped some. Had an x-ray which revealed degenerative disc issues. No L UE pain, just localized L posterior lateral neck pain. Neck gets very tight at the end of the day. Feels like she needs to support her head to take the pressure off the neck. Taking  naproxen which helps. Pain has improved in some ways since onset 6 months ago.    Patient Stated Goals Have unlimited ROM for her neck    Currently in Pain? Yes    Pain Location Neck    Pain Orientation Left;Lateral;Posterior    Pain Descriptors / Indicators Aching   shock, eletrical, grabbing   Pain Type Chronic pain    Pain Onset More than a month ago    Pain Frequency Occasional    Aggravating Factors  L cervical rotation, cervical extension, L cervical side bend    Pain Relieving Factors massage helps but feels like it is not enough              [facility] PT Assessment - 11/06/19 1740      Assessment   Medical Diagnosis Degeneration of cervical intervertebral disc    Referring Provider (PT) Deborra Medina, MD    Onset Date/Surgical Date 10/14/19    Hand Dominance Right    Prior Therapy For R UE symptoms wiht good results.       Precautions   Precaution Comments no known precautions      Restrictions   Other Position/Activity Restrictions No known restrictions      Balance Screen   Has the patient fallen in the past 6 months No  Has the patient had a decrease in activity level because of a fear of falling?  No    Is the patient reluctant to leave their home because of a fear of falling?  No      Prior Function   Vocation Full time employment    Vocation Requirements PLOF: Better able to look up as well as to the L more comfortably      Observation/Other Assessments   Focus on Therapeutic Outcomes (FOTO)  Neck FOTO: 63      Posture/Postural Control   Posture Comments protracted neck, B protracted shoulders, slight R upper trunk rotation, slight L lateral shift for low back      AROM   Overall AROM Comments 10 degrees R cervical rotation posture     Cervical Flexion full   L medial scapular symptoms with overpressure   Cervical Extension WFL with L lateral neck pain. Movement preference C5/6    Cervical - Right Side Bend WFL     Cervical - Left Side Bend limited with  L lateral neck pain (L scalene muscle tension)    Cervical - Right Rotation 70 degrees    Cervical - Left Rotation 45 degrees   with neck pain     Strength   Right Shoulder Flexion 5/5    Right Shoulder ABduction 5/5    Left Shoulder Flexion 5/5    Left Shoulder ABduction 5/5    Right Elbow Flexion 4/5    Right Elbow Extension 4+/5    Left Elbow Flexion 4/5    Left Elbow Extension 4/5    Right Wrist Extension 5/5    Left Wrist Extension 4+/5      Palpation   Palpation comment Muscle tension B upper trap      Special Tests   Other special tests (-) Alar ligament test, (-) VBI                       Objective measurements completed on examination: See above findings.    Muscle tension B upper trap  Supine cervical rotation with neck flexed. Less discomfort with L cervical rotation     Medbridge Access Code [email or web address]   Manual therapy  Supine R UPA to C5 and C6 TP grade 3 to decrease stiffness Then SNAGs with R UPA pressure to C6 TP with L cervical rotation in supine 10x3   No change in pain with L cervical rotation      Therapeutic exercise  Supine L cervical rotation with chin tuck 10x3 Seated B scapular retraction 10x5 seconds   Supine chin tuck 10x5 seconds  Reviewed and given as part of her HEP. Pt demonstrated and verbalized understanding. Handout provided.   Improved exercise technique, movement at target joints, use of target muscles after mod verbal, visual, tactile cues.   Response to treatment Pt tolerated session well without aggravation of symptoms  Clinical impression Patient is a 54 year old female who came to physical therapy secondary to neck pain. She also presents with altered posture, bilateral upper trap muscle tension, movement preference along C5/C6 area, reproduction of symptoms, with cervical extension, L side bending, and L cervical rotation, limited L cervical rotation AROM, scapular weakness, and difficulty performing tasks  which involve looking to the L as well as looking up. Pt will benefit from skilled physical therapy services to address the aforementioned deficits.        PT Education - 11/06/19 1853    Education Details ther-ex,  HEP, plan of care    Person(s) Educated Patient    Methods Explanation;Demonstration;Tactile cues;Verbal cues;Handout    Comprehension Returned demonstration;Verbalized understanding            PT Short Term Goals - 11/06/19 1902      PT SHORT TERM GOAL #1   Title Patient will be independent with her HEP to improve cervical and scapular strength, decrease pain, and improve ability to look around more comfortably.    Baseline Pt has started her HEP (11/06/2019)    Time 3    Period Weeks    Status New    Target Date 11/28/19             PT Long Term Goals - 11/06/19 1903      PT LONG TERM GOAL #1   Title Patient will have a decrease in neck pain to 2/10 or less at worst to promote ability to look around more comfortably.    Baseline 6/10 neck pain at most (11/06/2019)    Time 6    Period Weeks    Status New    Target Date 12/19/19      PT LONG TERM GOAL #2   Title Patient will improve L cervical AROM to 60 degrees or more without complain of pain to promote ability to look around more comfortably.    Baseline 45 degrees L cervical rotation with pain (11/06/2019)    Time 6    Period Weeks    Status New    Target Date 12/19/19      PT LONG TERM GOAL #3   Title Patient will improve her neck FOTO score by at least 10 points as a demonstration of improved function.    Baseline neck FOTO: 63 (11/06/2019)    Time 6    Period Weeks    Status New    Target Date 12/19/19                  Plan - 11/06/19 1854    Clinical Impression Statement Patient is a 54 year old female who came to physical therapy secondary to neck pain. She also presents with altered posture, bilateral upper trap muscle tension, movement preference along C5/C6 area, reproduction of  symptoms, with cervical extension, L side bending, and L cervical rotation, limited L cervical rotation AROM, scapular weakness, and difficulty performing tasks which involve looking to the L as well as looking up. Pt will benefit from skilled physical therapy services to address the aforementioned deficits.    Personal Factors and Comorbidities Time since onset of injury/illness/exacerbation    Examination-Activity Limitations Other   L cervical rotation   Stability/Clinical Decision Making Stable/Uncomplicated    Clinical Decision Making Low    Rehab Potential Fair    PT Frequency 2x / week    PT Duration 6 weeks    PT Treatment/Interventions Electrical Stimulation;Iontophoresis 4mg /ml Dexamethasone;Traction;Therapeutic activities;Therapeutic exercise;Neuromuscular re-education;Patient/family education;Manual techniques;Dry needling;Spinal Manipulations;Joint Manipulations    PT Next Visit Plan scapular and anterior cervical strengthening, manual techniques, modalities PRN    PT Home Exercise Plan Medbridge Access Code [email or web address]    Consulted and Agree with Plan of Care Patient           Patient will benefit from skilled therapeutic intervention in order to improve the following deficits and impairments:  Pain, Postural dysfunction, Improper body mechanics, Decreased strength, Decreased range of motion  Visit Diagnosis: Cervicalgia - Plan: PT plan of care cert/re-cert     Problem List Patient  Active Problem List   Diagnosis Date Noted  . Degeneration of cervical intervertebral disc 10/10/2019  . Encounter for screening colonoscopy   . Atrophic vaginitis 07/23/2019  . Endometriosis determined by laparoscopy 07/19/2019  . Digital mucous cyst 02/22/2019  . [place] node 02/22/2019  . Synovial cyst of hand 02/18/2019  . Rectal bleeding 10/10/2018  . Marital conflict 26/41/5830  . Menopausal state 04/15/2018  . Perimenopause 09/12/2017  . Foot pain, left 04/08/2017  . IBS  (irritable bowel syndrome) 04/05/2016  . Anxiety, generalized 12/02/2015  . Insomnia 04/05/2015  . Alkaline phosphatase elevation 03/21/2014  . Encounter for preventive health examination 02/17/2012  . Overweight (BMI 25.0-29.9) 10/10/2011  . Allergic rhinitis, seasonal   . GERD (gastroesophageal reflux disease)     Joneen Boers PT, DPT   11/06/2019, 7:22 PM  [facility] [address], [place], [address] Phone: [phone]   Fax:  [phone]  Name: Paula Jensen MRN: [id number] Date of Birth: 09/20/1965

## 2019-11-13 ENCOUNTER — Other Ambulatory Visit: Payer: Self-pay

## 2019-11-13 ENCOUNTER — Ambulatory Visit: Payer: No Typology Code available for payment source

## 2019-11-13 DIAGNOSIS — M542 Cervicalgia: Secondary | ICD-10-CM

## 2019-11-13 NOTE — Patient Instructions (Signed)
Access Code: [email or web address] URL: [email or web address] Date: 11/13/2019 Prepared by: Joneen Boers  Exercises Supine Chin Tuck - 1 x daily - 7 x weekly - 3 sets - 10 reps - 5 hold Shoulder Extension with Resistance - 1 x daily - 7 x weekly - 3 sets - 10 reps

## 2019-11-13 NOTE — Therapy (Signed)
[facility] [address], [place], [address] Phone: [phone]   Fax:  [phone]  Physical Therapy Treatment  Patient Details  Name: Paula Jensen MRN: [id number] Date of Birth: 1965-11-05 Referring Provider (PT): Deborra Medina, MD   Encounter Date: 11/13/2019   PT End of Session - 11/13/19 1649    Visit Number 2    Number of Visits 13    Date for PT Re-Evaluation 12/19/19    PT Start Time 5284    PT Stop Time 1747    PT Time Calculation (min) 58 min    Activity Tolerance Patient tolerated treatment well    Behavior During Therapy [facility] for tasks assessed/performed           Past Medical History:  Diagnosis Date  . allergic rhinitis   . Cancer ([place])    skin  . Family history of adverse reaction to anesthesia    Daughter - PONV  . GERD (gastroesophageal reflux disease)   . Wears contact lenses     Past Surgical History:  Procedure Laterality Date  . COLONOSCOPY WITH PROPOFOL N/A 08/23/2019   Procedure: COLONOSCOPY WITH PROPOFOL;  Surgeon: [name], MD;  Location: [place];  Service: Endoscopy;  Laterality: N/A;  priority 4  . DILATION AND CURETTAGE OF UTERUS  June 2012   [place] , for heavy bleeding   . laparoscopy  1996  . TONSILLECTOMY AND ADENOIDECTOMY      There were no vitals filed for this visit.   Subjective Assessment - 11/13/19 1650    Subjective L neck still hurts. 4/10 currently.    Pertinent History Neck pain with radiculopathy. Pain began about 6 months ago. Has beend massaging her L upper trap. Pain mainly on L posterior lateral neck. Did a trigger point injection which helped some. Had an x-ray which revealed degenerative disc issues. No L UE pain, just localized L posterior lateral neck pain. Neck gets very tight at the end of the day. Feels like she needs to support her head to take the pressure off the neck. Taking naproxen which helps. Pain has improved in  some ways since onset 6 months ago.    Patient Stated Goals Have unlimited ROM for her neck    Currently in Pain? Yes    Pain Score 4     Pain Onset More than a month ago                                     PT Education - 11/13/19 1809    Education Details ther-ex, HEP    Person(s) Educated Patient    Methods Explanation;Demonstration;Tactile cues;Verbal cues;Handout    Comprehension Verbalized understanding;Returned demonstration          Objective   Muscle tension B upper trap  Supine cervical rotation with neck flexed. Less discomfort with L cervical rotation   No latex allergies   Medbridge Access Code [email or web address]   Manual therapy Seated STM L upper trap and scalene muscles   Seated STM R rhomboid minor and upper trap muscles   Seated cervical compression: increased L posterior lateral neck symptoms  Seated cervical distraction: no L posterior lateral neck symptoms in rest afterwards  Supine manual cervical traction     Try SNAGs in sitting next visit if appropriate      Therapeutic exercise  Seated manually resisted L  lateral shift isometrics in neutral to counter R lateral shift posture 10x3 with 5 second holds  Standing L shoulder extension with scapular retraction and posterior tipping to activate lower trapezius muscle yellow band 10x3  Decreased Neck pain with L cervical rotatoin  Lower trap raise at wall   L 10x2   Scapular retraction yellow band 10x3  Manually resisted L scapular depression isometrics 10x2 with 5 seconds. Slight increase in L posterior lateral neck symptoms.   Try TENS next visit if appropriate    Improved exercise technique, movement at target joints, use of target muscles after mod verbal, visual, tactile cues.   Response to treatment Fair tolerance to today's session.   Clinical impression Fair tolerance to today's session. Slight irritable state L neck pain. Slight decrease in  symptoms with L cervical rotation after treatment to activate L scapular retractor muscles (such as lower trap) only. Pt was recommended to try ice at home to affected area for pain control. Pt verbalized understanding. Pt will benefit from continued skilled physical therapy services to decrease pain, improve strength, and function.        PT Short Term Goals - 11/06/19 1902      PT SHORT TERM GOAL #1   Title Patient will be independent with her HEP to improve cervical and scapular strength, decrease pain, and improve ability to look around more comfortably.    Baseline Pt has started her HEP (11/06/2019)    Time 3    Period Weeks    Status New    Target Date 11/28/19             PT Long Term Goals - 11/06/19 1903      PT LONG TERM GOAL #1   Title Patient will have a decrease in neck pain to 2/10 or less at worst to promote ability to look around more comfortably.    Baseline 6/10 neck pain at most (11/06/2019)    Time 6    Period Weeks    Status New    Target Date 12/19/19      PT LONG TERM GOAL #2   Title Patient will improve L cervical AROM to 60 degrees or more without complain of pain to promote ability to look around more comfortably.    Baseline 45 degrees L cervical rotation with pain (11/06/2019)    Time 6    Period Weeks    Status New    Target Date 12/19/19      PT LONG TERM GOAL #3   Title Patient will improve her neck FOTO score by at least 10 points as a demonstration of improved function.    Baseline neck FOTO: 63 (11/06/2019)    Time 6    Period Weeks    Status New    Target Date 12/19/19                 Plan - 11/13/19 1807    Clinical Impression Statement Fair tolerance to today's session. Slight irritable state L neck pain. Slight decrease in symptoms with L cervical rotation after treatment to activate L scapular retractor muscles (such as lower trap) only. Pt was recommended to try ice at home to affected area for pain control. Pt verbalized  understanding. Pt will benefit from continued skilled physical therapy services to decrease pain, improve strength, and function.    Personal Factors and Comorbidities Time since onset of injury/illness/exacerbation    Examination-Activity Limitations Other   L cervical rotation   Stability/Clinical  Decision Making Stable/Uncomplicated    Rehab Potential Fair    PT Frequency 2x / week    PT Duration 6 weeks    PT Treatment/Interventions Electrical Stimulation;Iontophoresis 4mg /ml Dexamethasone;Traction;Therapeutic activities;Therapeutic exercise;Neuromuscular re-education;Patient/family education;Manual techniques;Dry needling;Spinal Manipulations;Joint Manipulations    PT Next Visit Plan scapular and anterior cervical strengthening, manual techniques, modalities PRN    PT Home Exercise Plan Medbridge Access Code [email or web address]    Consulted and Agree with Plan of Care Patient           Patient will benefit from skilled therapeutic intervention in order to improve the following deficits and impairments:  Pain, Postural dysfunction, Improper body mechanics, Decreased strength, Decreased range of motion  Visit Diagnosis: Cervicalgia     Problem List Patient Active Problem List   Diagnosis Date Noted  . Degeneration of cervical intervertebral disc 10/10/2019  . Encounter for screening colonoscopy   . Atrophic vaginitis 07/23/2019  . Endometriosis determined by laparoscopy 07/19/2019  . Digital mucous cyst 02/22/2019  . [place] node 02/22/2019  . Synovial cyst of hand 02/18/2019  . Rectal bleeding 10/10/2018  . Marital conflict 34/91/7915  . Menopausal state 04/15/2018  . Perimenopause 09/12/2017  . Foot pain, left 04/08/2017  . IBS (irritable bowel syndrome) 04/05/2016  . Anxiety, generalized 12/02/2015  . Insomnia 04/05/2015  . Alkaline phosphatase elevation 03/21/2014  . Encounter for preventive health examination 02/17/2012  . Overweight (BMI 25.0-29.9) 10/10/2011  . Allergic  rhinitis, seasonal   . GERD (gastroesophageal reflux disease)     Joneen Boers PT, DPT   11/13/2019, 6:12 PM  [facility] [address] [place], [address] Phone: [phone]   Fax:  [phone]  Name: Paula Jensen MRN: [id number] Date of Birth: April 07, 1965

## 2019-11-19 ENCOUNTER — Ambulatory Visit: Payer: No Typology Code available for payment source

## 2019-12-03 ENCOUNTER — Ambulatory Visit: Payer: No Typology Code available for payment source

## 2019-12-11 ENCOUNTER — Other Ambulatory Visit: Payer: Self-pay | Admitting: Internal Medicine

## 2019-12-12 ENCOUNTER — Other Ambulatory Visit: Payer: Self-pay

## 2019-12-17 ENCOUNTER — Ambulatory Visit: Payer: No Typology Code available for payment source | Attending: Internal Medicine

## 2019-12-17 ENCOUNTER — Other Ambulatory Visit: Payer: Self-pay

## 2019-12-17 DIAGNOSIS — M542 Cervicalgia: Secondary | ICD-10-CM | POA: Insufficient documentation

## 2019-12-17 NOTE — Therapy (Signed)
[place] [facility] [address], [place], [address] Phone: [phone]   Fax:  [phone]  Physical Therapy Treatment  Patient Details  Name: Paula Jensen MRN: [id number] Date of Birth: 04/28/1965 Referring Provider (PT): Deborra Medina, MD   Encounter Date: 12/17/2019   PT End of Session - 12/17/19 1646    Visit Number 3    Number of Visits 13    Date for PT Re-Evaluation 12/19/19    PT Start Time 1646    PT Stop Time 1732    PT Time Calculation (min) 46 min    Activity Tolerance Patient tolerated treatment well    Behavior During Therapy [facility] for tasks assessed/performed           Past Medical History:  Diagnosis Date   allergic rhinitis    Cancer ([place])    skin   Family history of adverse reaction to anesthesia    Daughter - PONV   GERD (gastroesophageal reflux disease)    Wears contact lenses     Past Surgical History:  Procedure Laterality Date   COLONOSCOPY WITH PROPOFOL N/A 08/23/2019   Procedure: COLONOSCOPY WITH PROPOFOL;  Surgeon: [name], MD;  Location: [place];  Service: Endoscopy;  Laterality: N/A;  priority 4   DILATION AND CURETTAGE OF UTERUS  June 2012   [place] , for heavy bleeding    laparoscopy  1996   TONSILLECTOMY AND ADENOIDECTOMY      There were no vitals filed for this visit.   Subjective Assessment - 12/17/19 1647    Subjective L neck is not great today. Had to take some ibuprofen this morning. Was doing better but got tight again and got a little bit worse. Seemed to be ok during vacation for the most part. Does not really want to turn her head. Started back up with yoga, has to stop around 5:30 pm. 3/10 currently.    Pertinent History Neck pain with radiculopathy. Pain began about 6 months ago. Has beend massaging her L upper trap. Pain mainly on L posterior lateral neck. Did a trigger point injection which helped some. Had an x-ray which  revealed degenerative disc issues. No L UE pain, just localized L posterior lateral neck pain. Neck gets very tight at the end of the day. Feels like she needs to support her head to take the pressure off the neck. Taking naproxen which helps. Pain has improved in some ways since onset 6 months ago.    Patient Stated Goals Have unlimited ROM for her neck    Currently in Pain? Yes    Pain Score 3     Pain Onset More than a month ago                                     PT Education - 12/17/19 1904    Education Details ther-ex, HEP    Person(s) Educated Patient    Methods Explanation;Demonstration;Tactile cues;Verbal cues;Handout    Comprehension Returned demonstration;Verbalized understanding              Objective   Muscle tension B upper trap  Supine cervical rotation with neck flexed. Less discomfort with L cervical rotation   No latex allergies   MedbridgeAccess Code [email or web address]    Therapeutic exercise  Seated thoracic extension over chair 10x5 seconds for 2 sets  Supine chin tucks with towel  under upper thoracic spine 10x5 seconds for 2 sets  Supine open books with towel under upper thoracic spine 10x5 seconds for 2 sets   with towel under upper thoracic spine:  Supine B shoulder flexion 10x5 seconds for 2 sets   Diaphragmatic breathing to decrease B scalene muscle tension    Bridge 10x3   Cervical nodding 10x5 seconds to strengthen anterior cervical muscles for 3 sets   Decreased L scalene muscle tension palpated. Decreased L lateral neck ache   Seated L cervical rotation with PT caudal pressure to R first rib area 10x  Increased L cervical warmth   Seated R cervical side bend 4x5 seconds. L lateral neck warmth, eases with rest.     Try TENS next visit if appropriate    Improved exercise technique, movement at target joints, use of target muscles after mod verbal, visual, tactile cues.  Response to  treatment Fair tolerance to today's session.   Clinical impression  Decreased L lateral neck tension palpated with anterior cervical muscle activation. Worked on improving thoracic extension to decrease stress to lower cervical spine. Fair tolerance to today's session. Pt will benefit from continued skilled physical therapy services to decrease pain, improve strength and function.       PT Short Term Goals - 11/06/19 1902      PT SHORT TERM GOAL #1   Title Patient will be independent with her HEP to improve cervical and scapular strength, decrease pain, and improve ability to look around more comfortably.    Baseline Pt has started her HEP (11/06/2019)    Time 3    Period Weeks    Status New    Target Date 11/28/19             PT Long Term Goals - 11/06/19 1903      PT LONG TERM GOAL #1   Title Patient will have a decrease in neck pain to 2/10 or less at worst to promote ability to look around more comfortably.    Baseline 6/10 neck pain at most (11/06/2019)    Time 6    Period Weeks    Status New    Target Date 12/19/19      PT LONG TERM GOAL #2   Title Patient will improve L cervical AROM to 60 degrees or more without complain of pain to promote ability to look around more comfortably.    Baseline 45 degrees L cervical rotation with pain (11/06/2019)    Time 6    Period Weeks    Status New    Target Date 12/19/19      PT LONG TERM GOAL #3   Title Patient will improve her neck FOTO score by at least 10 points as a demonstration of improved function.    Baseline neck FOTO: 63 (11/06/2019)    Time 6    Period Weeks    Status New    Target Date 12/19/19                 Plan - 12/17/19 1707    Clinical Impression Statement Decreased L lateral neck tension palpated with anterior cervical muscle activation. Worked on improving thoracic extension to decrease stress to lower cervical spine. Fair tolerance to today's session. Pt will benefit from continued skilled  physical therapy services to decrease pain, improve strength and function.    Personal Factors and Comorbidities Time since onset of injury/illness/exacerbation    Examination-Activity Limitations Other   L cervical rotation  Stability/Clinical Decision Making Stable/Uncomplicated    Rehab Potential Fair    PT Frequency 2x / week    PT Duration 6 weeks    PT Treatment/Interventions Electrical Stimulation;Iontophoresis 4mg /ml Dexamethasone;Traction;Therapeutic activities;Therapeutic exercise;Neuromuscular re-education;Patient/family education;Manual techniques;Dry needling;Spinal Manipulations;Joint Manipulations    PT Next Visit Plan scapular and anterior cervical strengthening, manual techniques, modalities PRN    PT Home Exercise Plan Medbridge Access Code [email or web address]    [email or web address] and Agree with Plan of Care Patient           Patient will benefit from skilled therapeutic intervention in order to improve the following deficits and impairments:  Pain, Postural dysfunction, Improper body mechanics, Decreased strength, Decreased range of motion  Visit Diagnosis: Cervicalgia     Problem List Patient Active Problem List   Diagnosis Date Noted   Degeneration of cervical intervertebral disc 10/10/2019   Encounter for screening colonoscopy    Atrophic vaginitis 07/23/2019   Endometriosis determined by laparoscopy 07/19/2019   Digital mucous cyst 02/22/2019   [place] node 02/22/2019   Synovial cyst of hand 02/18/2019   Rectal bleeding 50/53/9767   Marital conflict 34/19/3790   Menopausal state 04/15/2018   Perimenopause 09/12/2017   Foot pain, left 04/08/2017   IBS (irritable bowel syndrome) 04/05/2016   Anxiety, generalized 12/02/2015   Insomnia 04/05/2015   Alkaline phosphatase elevation 03/21/2014   Encounter for preventive health examination 02/17/2012   Overweight (BMI 25.0-29.9) 10/10/2011   Allergic rhinitis, seasonal    GERD (gastroesophageal reflux  disease)     Joneen Boers PT, DPT   12/17/2019, 7:11 PM  [place] [facility] [address], [place], [address] Phone: [phone]   Fax:  [phone]  Name: Paula Jensen MRN: [id number] Date of Birth: 02/14/1966

## 2019-12-17 NOTE — Patient Instructions (Signed)
Access Code: [email or web address] URL: [email or web address] Date: 12/17/2019 Prepared by: Joneen Boers  Exercises Supine Chin Tuck - 1 x daily - 7 x weekly - 3 sets - 10 reps - 5 hold Shoulder Extension with Resistance - 1 x daily - 7 x weekly - 3 sets - 10 reps Supine Head Nod Deep Neck Flexor Training - 1 x daily - 7 x weekly - 3 sets - 10 reps - 5 seconds hold

## 2019-12-19 ENCOUNTER — Other Ambulatory Visit: Payer: Self-pay

## 2019-12-19 ENCOUNTER — Ambulatory Visit: Payer: No Typology Code available for payment source

## 2019-12-19 DIAGNOSIS — M542 Cervicalgia: Secondary | ICD-10-CM | POA: Diagnosis not present

## 2019-12-19 NOTE — Therapy (Signed)
[place] [facility] [address] [place], [address] Phone: [phone]   Fax:  [phone]  Physical Therapy Treatment  Patient Details  Name: Paula Jensen MRN: [id number] Date of Birth: 1965-06-28 Referring Provider (PT): Deborra Medina, MD   Encounter Date: 12/19/2019   PT End of Session - 12/19/19 1452    Visit Number 4    Number of Visits 17    Date for PT Re-Evaluation 01/02/20    PT Start Time 2703    PT Stop Time 1548    PT Time Calculation (min) 56 min    Activity Tolerance Patient tolerated treatment well    Behavior During Therapy [facility] for tasks assessed/performed           Past Medical History:  Diagnosis Date  . allergic rhinitis   . Cancer ([place])    skin  . Family history of adverse reaction to anesthesia    Daughter - PONV  . GERD (gastroesophageal reflux disease)   . Wears contact lenses     Past Surgical History:  Procedure Laterality Date  . COLONOSCOPY WITH PROPOFOL N/A 08/23/2019   Procedure: COLONOSCOPY WITH PROPOFOL;  Surgeon: [name], MD;  Location: [place];  Service: Endoscopy;  Laterality: N/A;  priority 4  . DILATION AND CURETTAGE OF UTERUS  June 2012   [place] , for heavy bleeding   . laparoscopy  1996  . TONSILLECTOMY AND ADENOIDECTOMY      There were no vitals filed for this visit.   Subjective Assessment - 12/19/19 1453    Subjective L neck feels bad. Was not bad after yoga. Yoga does not bother it that much due to compensation. 2/10 currently. Was not too bad this morning.  Has not really done her HEP yet. when L neck feels tired, pt feels pain L medial scapula.    Pertinent History Neck pain with radiculopathy. Pain began about 6 months ago. Has beend massaging her L upper trap. Pain mainly on L posterior lateral neck. Did a trigger point injection which helped some. Had an x-ray which revealed degenerative disc issues. No L UE pain, just localized L  posterior lateral neck pain. Neck gets very tight at the end of the day. Feels like she needs to support her head to take the pressure off the neck. Taking naproxen which helps. Pain has improved in some ways since onset 6 months ago.    Patient Stated Goals Have unlimited ROM for her neck    Currently in Pain? Yes    Pain Score 2     Pain Orientation Left    Pain Onset More than a month ago                                     PT Education - 12/19/19 1458    Education Details ther-ex    Person(s) Educated Patient    Methods Explanation;Demonstration;Tactile cues;Verbal cues    Comprehension Returned demonstration;Verbalized understanding          Objective   Muscle tension B upper trap  Supine cervical rotation with neck flexed. Less discomfort with L cervical rotation   No latex allergies   MedbridgeAccess Code [email or web address]  when L neck feels tired, pt feels pain L medial scapula  Manual therapy  Supine STM R cervical paraspinal muscles    STM L infraspinatus muscle  Therapeutic exercise L first rib stretch, small to medium stretch 10x5 seconds for 2 sets R cervical side bend L first rib stretch, small to medium stretch with R and L cervical rotation 10x2 each direction  Cervical nodding 10x5 seconds to strengthen anterior cervical muscles for 3 sets                 Decreased L scalene muscle tension palpated. Decreased L lateral neck fatigue                Diaphragmatic breathing to decrease B scalene muscle tension  Seated B scapular retraction   Decreased L neck fatigue at rest. Not as uncomfortable when returning to neutral from L cervical rotation.   Reviewed HEP  Try TENS next visit if appropriate    Improved exercise technique, movement at target joints, use of target muscles after mod verbal, visual, tactile cues.  Response to treatment Fair tolerance to today's session.  Clinical  impression  Decreased L lateral neck tension palpated with anterior cervical muscle activation. Decreased L lateral neck fatigue with activation of scapular muscles during today's session. Similar pain level and cervical AROM since initial evaluation. Difficulty with progress secondary to pt being only able to make it to 3 follow up sessions with a 1 month hiatus after the first follow up appointment. Some improvement during today's session with activation of anterior cervical as well as scapular muscles with proper posture. Pt will benefit from continued skilled physical therapy services to decrease pain, improve strength and function.      PT Short Term Goals - 12/19/19 2237      PT SHORT TERM GOAL #1   Title Patient will be independent with her HEP to improve cervical and scapular strength, decrease pain, and improve ability to look around more comfortably.    Baseline Pt has started her HEP (11/06/2019); Pt has not really performed HEP yet per subjective (12/19/2019)    Time 3    Period Weeks    Status On-going    Target Date 01/09/20             PT Long Term Goals - 12/19/19 1455      PT LONG TERM GOAL #1   Title Patient will have a decrease in neck pain to 2/10 or less at worst to promote ability to look around more comfortably.    Baseline 6/10 neck pain at most (11/06/2019); 7/10 at most for the past 7 days (resting helps) (12/19/2019)    Time 2    Period Weeks    Status On-going    Target Date 01/02/20      PT LONG TERM GOAL #2   Title Patient will improve L cervical AROM to 60 degrees or more without complain of pain to promote ability to look around more comfortably.    Baseline 45 degrees L cervical rotation with pain (11/06/2019);  50 degrees L cervical rotation with a little pain; 65 degrees R (12/19/2019)    Time 2    Period Weeks    Status On-going    Target Date 01/02/20      PT LONG TERM GOAL #3   Title Patient will improve her neck FOTO score by at least 10  points as a demonstration of improved function.    Baseline neck FOTO: 63 (11/06/2019)    Time 2    Period Weeks    Status On-going    Target Date 01/02/20  Plan - 12/19/19 2237    Clinical Impression Statement Decreased L lateral neck tension palpated with anterior cervical muscle activation. Decreased L lateral neck fatigue with activation of scapular muscles during today's session. Similar pain level and cervical AROM since initial evaluation. Difficulty with progress secondary to pt being only able to make it to 3 follow up sessions with a 1 month hiatus after the first follow up appointment. Some improvement during today's session with activation of anterior cervical as well as scapular muscles with proper posture. Pt will benefit from continued skilled physical therapy services to decrease pain, improve strength and function.    Personal Factors and Comorbidities Time since onset of injury/illness/exacerbation    Examination-Activity Limitations Other   L cervical rotation   Stability/Clinical Decision Making Stable/Uncomplicated    Rehab Potential Fair    PT Frequency 2x / week    PT Duration 2 weeks    PT Treatment/Interventions Electrical Stimulation;Iontophoresis 4mg /ml Dexamethasone;Traction;Therapeutic activities;Therapeutic exercise;Neuromuscular re-education;Patient/family education;Manual techniques;Dry needling;Spinal Manipulations;Joint Manipulations    PT Next Visit Plan scapular and anterior cervical strengthening, manual techniques, modalities PRN    PT Home Exercise Plan Medbridge Access Code [email or web address]    Consulted and Agree with Plan of Care Patient           Patient will benefit from skilled therapeutic intervention in order to improve the following deficits and impairments:  Pain, Postural dysfunction, Improper body mechanics, Decreased strength, Decreased range of motion  Visit Diagnosis: Cervicalgia - Plan: PT plan of care  cert/re-cert     Problem List Patient Active Problem List   Diagnosis Date Noted  . Degeneration of cervical intervertebral disc 10/10/2019  . Encounter for screening colonoscopy   . Atrophic vaginitis 07/23/2019  . Endometriosis determined by laparoscopy 07/19/2019  . Digital mucous cyst 02/22/2019  . [place] node 02/22/2019  . Synovial cyst of hand 02/18/2019  . Rectal bleeding 10/10/2018  . Marital conflict 75/07/1831  . Menopausal state 04/15/2018  . Perimenopause 09/12/2017  . Foot pain, left 04/08/2017  . IBS (irritable bowel syndrome) 04/05/2016  . Anxiety, generalized 12/02/2015  . Insomnia 04/05/2015  . Alkaline phosphatase elevation 03/21/2014  . Encounter for preventive health examination 02/17/2012  . Overweight (BMI 25.0-29.9) 10/10/2011  . Allergic rhinitis, seasonal   . GERD (gastroesophageal reflux disease)     Joneen Boers PT, DPT   12/19/2019, 10:49 PM  [place] [facility] [address], [place], [address] Phone: [phone]   Fax:  [phone]  Name: Paula Jensen MRN: [id number] Date of Birth: 07-Nov-1965

## 2019-12-19 NOTE — Patient Instructions (Signed)
Access Code: [email or web address] URL: [email or web address] Date: 12/19/2019 Prepared by: Joneen Boers  Exercises Supine Chin Tuck - 1 x daily - 7 x weekly - 3 sets - 10 reps - 5 hold Shoulder Extension with Resistance - 1 x daily - 7 x weekly - 3 sets - 10 reps Supine Head Nod Deep Neck Flexor Training - 1 x daily - 7 x weekly - 3 sets - 10 reps - 5 seconds hold Seated Scapular Retraction - 5 x daily - 7 x weekly - 3 sets - 10 reps - 5 to 10 seconds hold

## 2019-12-23 ENCOUNTER — Ambulatory Visit: Payer: No Typology Code available for payment source | Attending: Internal Medicine

## 2019-12-23 ENCOUNTER — Other Ambulatory Visit: Payer: Self-pay

## 2019-12-23 DIAGNOSIS — M542 Cervicalgia: Secondary | ICD-10-CM | POA: Insufficient documentation

## 2019-12-23 NOTE — Therapy (Signed)
[facility] [address], [place], [address] Phone: [phone]   Fax:  [phone]  Physical Therapy Treatment  Patient Details  Name: Paula Jensen MRN: [id number] Date of Birth: 02/27/1966 Referring Provider (PT): Deborra Medina, MD   Encounter Date: 12/23/2019   PT End of Session - 12/23/19 1725    Visit Number 5    Number of Visits 17    Date for PT Re-Evaluation 01/02/20    PT Start Time 9417    PT Stop Time 1830    PT Time Calculation (min) 65 min    Activity Tolerance Patient tolerated treatment well    Behavior During Therapy [facility] for tasks assessed/performed           Past Medical History:  Diagnosis Date  . allergic rhinitis   . Cancer ([place])    skin  . Family history of adverse reaction to anesthesia    Daughter - PONV  . GERD (gastroesophageal reflux disease)   . Wears contact lenses     Past Surgical History:  Procedure Laterality Date  . COLONOSCOPY WITH PROPOFOL N/A 08/23/2019   Procedure: COLONOSCOPY WITH PROPOFOL;  Surgeon: [name], MD;  Location: [place];  Service: Endoscopy;  Laterality: N/A;  priority 4  . DILATION AND CURETTAGE OF UTERUS  June 2012   [place] , for heavy bleeding   . laparoscopy  1996  . TONSILLECTOMY AND ADENOIDECTOMY      There were no vitals filed for this visit.   Subjective Assessment - 12/23/19 1726    Subjective L neck is ok. 2/10 currently.    Pertinent History Neck pain with radiculopathy. Pain began about 6 months ago. Has beend massaging her L upper trap. Pain mainly on L posterior lateral neck. Did a trigger point injection which helped some. Had an x-ray which revealed degenerative disc issues. No L UE pain, just localized L posterior lateral neck pain. Neck gets very tight at the end of the day. Feels like she needs to support her head to take the pressure off the neck. Taking naproxen which helps. Pain has improved in some  ways since onset 6 months ago.    Patient Stated Goals Have unlimited ROM for her neck    Currently in Pain? Yes    Pain Score 2     Pain Onset More than a month ago                                     PT Education - 12/23/19 1739    Education Details ther-ex    Person(s) Educated Patient    Methods Explanation;Demonstration;Tactile cues;Verbal cues    Comprehension Returned demonstration;Verbalized understanding          Objective   Muscle tension B upper trap  Supine cervical rotation with neck flexed. Less discomfort with L cervical rotation   No latex allergies   MedbridgeAccess Code [email or web address]  when L neck feels tired, pt feels pain L medial scapula  Observation: B shoulder shrug when pt takes a deep breath   E-stim  [place] E-stim L lower trap, muscle contraction during 10 seconds on, rest at 10 seconds off.   31 mA for 15 minutes   Manual therapy     seated STM L upper trap muscle, L distal scalene  to decrease muscle tension and fascial restrictions   Seated  STM L posterior lateral neck to decrease fascial restrictions.    Try using edge tool next visit if appropriate.    Therapeutic exercise B scapular retraction during E-stim  L cervical rotation multiple times to assess effectiveness of treatment  Seated cervical rotation to the L with PT assist for decreasing R lateral shift posture 10x  Seated R cervical side bend isometrics in neutral 10x5 seconds for 2 sets    Seated gentle eccentric R and L cervical rotation 5x each way.    Possible decreased in L lateral neck discomfort with L cervical rotation.      Improved exercise technique, movement at target joints, use of target muscles after mod verbal, visual, tactile cues.  Response to treatment Fair tolerance to today's session.  Clinical impression Decreased pain with L cervical rotation with treatment to decrease fascial restriction to L  posterior lateral neck as well as with eccentric cervical rotation. Added e-stim to promote L lower trap strengthening to help decrease L upper trap muscle tension. Fair tolerance to today's session. Pt will benefit from continued skilled physical therapy services to decrease pain, improve strength and function.        PT Short Term Goals - 12/19/19 2237      PT SHORT TERM GOAL #1   Title Patient will be independent with her HEP to improve cervical and scapular strength, decrease pain, and improve ability to look around more comfortably.    Baseline Pt has started her HEP (11/06/2019); Pt has not really performed HEP yet per subjective (12/19/2019)    Time 3    Period Weeks    Status On-going    Target Date 01/09/20             PT Long Term Goals - 12/19/19 1455      PT LONG TERM GOAL #1   Title Patient will have a decrease in neck pain to 2/10 or less at worst to promote ability to look around more comfortably.    Baseline 6/10 neck pain at most (11/06/2019); 7/10 at most for the past 7 days (resting helps) (12/19/2019)    Time 2    Period Weeks    Status On-going    Target Date 01/02/20      PT LONG TERM GOAL #2   Title Patient will improve L cervical AROM to 60 degrees or more without complain of pain to promote ability to look around more comfortably.    Baseline 45 degrees L cervical rotation with pain (11/06/2019);  50 degrees L cervical rotation with a little pain; 65 degrees R (12/19/2019)    Time 2    Period Weeks    Status On-going    Target Date 01/02/20      PT LONG TERM GOAL #3   Title Patient will improve her neck FOTO score by at least 10 points as a demonstration of improved function.    Baseline neck FOTO: 63 (11/06/2019)    Time 2    Period Weeks    Status On-going    Target Date 01/02/20                 Plan - 12/23/19 1724    Clinical Impression Statement Decreased pain with L cervical rotation with treatment to decrease fascial restriction to L  posterior lateral neck as well as with eccentric cervical rotation. Added e-stim to promote L lower trap strengthening to help decrease L upper trap muscle tension. Fair tolerance to today's session. Pt will  benefit from continued skilled physical therapy services to decrease pain, improve strength and function.    Personal Factors and Comorbidities Time since onset of injury/illness/exacerbation    Examination-Activity Limitations Other   L cervical rotation   Stability/Clinical Decision Making Stable/Uncomplicated    Rehab Potential Fair    PT Frequency 2x / week    PT Duration 2 weeks    PT Treatment/Interventions Electrical Stimulation;Iontophoresis 4mg /ml Dexamethasone;Traction;Therapeutic activities;Therapeutic exercise;Neuromuscular re-education;Patient/family education;Manual techniques;Dry needling;Spinal Manipulations;Joint Manipulations    PT Next Visit Plan scapular and anterior cervical strengthening, manual techniques, modalities PRN    PT Home Exercise Plan Medbridge Access Code [email or web address]    Consulted and Agree with Plan of Care Patient           Patient will benefit from skilled therapeutic intervention in order to improve the following deficits and impairments:  Pain, Postural dysfunction, Improper body mechanics, Decreased strength, Decreased range of motion  Visit Diagnosis: Cervicalgia     Problem List Patient Active Problem List   Diagnosis Date Noted  . Degeneration of cervical intervertebral disc 10/10/2019  . Encounter for screening colonoscopy   . Atrophic vaginitis 07/23/2019  . Endometriosis determined by laparoscopy 07/19/2019  . Digital mucous cyst 02/22/2019  . [place] node 02/22/2019  . Synovial cyst of hand 02/18/2019  . Rectal bleeding 10/10/2018  . Marital conflict 01/04/5101  . Menopausal state 04/15/2018  . Perimenopause 09/12/2017  . Foot pain, left 04/08/2017  . IBS (irritable bowel syndrome) 04/05/2016  . Anxiety, generalized 12/02/2015    . Insomnia 04/05/2015  . Alkaline phosphatase elevation 03/21/2014  . Encounter for preventive health examination 02/17/2012  . Overweight (BMI 25.0-29.9) 10/10/2011  . Allergic rhinitis, seasonal   . GERD (gastroesophageal reflux disease)     Joneen Boers PT, DPT   12/23/2019, 6:39 PM  [facility] [address] [place], [address] Phone: [phone]   Fax:  [phone]  Name: Paula Jensen MRN: [id number] Date of Birth: 1965-06-13

## 2019-12-25 ENCOUNTER — Ambulatory Visit: Payer: No Typology Code available for payment source

## 2019-12-25 ENCOUNTER — Other Ambulatory Visit: Payer: Self-pay

## 2019-12-25 DIAGNOSIS — M542 Cervicalgia: Secondary | ICD-10-CM

## 2019-12-25 NOTE — Patient Instructions (Signed)
Access Code: [email or web address] URL: [email or web address] Date: 12/25/2019 Prepared by: Joneen Boers  Exercises Supine Chin Tuck - 1 x daily - 7 x weekly - 3 sets - 10 reps - 5 hold Supine Head Nod Deep Neck Flexor Training - 1 x daily - 7 x weekly - 3 sets - 10 reps - 5 seconds hold Seated Scapular Retraction - 5 x daily - 7 x weekly - 3 sets - 10 reps - 5 to 10 seconds hold Single Arm Shoulder Extension with Anchored Resistance - 1 x daily - 7 x weekly - 3 sets - 10 reps - 5 seconds hold

## 2019-12-25 NOTE — Therapy (Signed)
[facility] [address] [place], [address] Phone: [phone]   Fax:  [phone]  Physical Therapy Treatment  Patient Details  Name: Paula Jensen MRN: [id number] Date of Birth: 05-25-1965 Referring Provider (PT): Deborra Medina, MD   Encounter Date: 12/25/2019   PT End of Session - 12/25/19 1734    Visit Number 6    Number of Visits 17    Date for PT Re-Evaluation 01/02/20    PT Start Time 0272    PT Stop Time 1824    PT Time Calculation (min) 50 min    Activity Tolerance Patient tolerated treatment well    Behavior During Therapy [facility] for tasks assessed/performed           Past Medical History:  Diagnosis Date  . allergic rhinitis   . Cancer ([place])    skin  . Family history of adverse reaction to anesthesia    Daughter - PONV  . GERD (gastroesophageal reflux disease)   . Wears contact lenses     Past Surgical History:  Procedure Laterality Date  . COLONOSCOPY WITH PROPOFOL N/A 08/23/2019   Procedure: COLONOSCOPY WITH PROPOFOL;  Surgeon: [name], MD;  Location: [place];  Service: Endoscopy;  Laterality: N/A;  priority 4  . DILATION AND CURETTAGE OF UTERUS  June 2012   [place] , for heavy bleeding   . laparoscopy  1996  . TONSILLECTOMY AND ADENOIDECTOMY      There were no vitals filed for this visit.   Subjective Assessment - 12/25/19 1735    Subjective L neck feels good. Took ibuprofen this morning and this afternoon. No pain at rest. 1/10 when turning to the L. Was sore and achy and fatigued yesterday. Also did yoga last night.    Pertinent History Neck pain with radiculopathy. Pain began about 6 months ago. Has beend massaging her L upper trap. Pain mainly on L posterior lateral neck. Did a trigger point injection which helped some. Had an x-ray which revealed degenerative disc issues. No L UE pain, just localized L posterior lateral neck pain. Neck gets very tight at  the end of the day. Feels like she needs to support her head to take the pressure off the neck. Taking naproxen which helps. Pain has improved in some ways since onset 6 months ago.    Patient Stated Goals Have unlimited ROM for her neck    Currently in Pain? Yes    Pain Score 1     Pain Onset More than a month ago                                     PT Education - 12/25/19 1813    Education Details ther-ex    Person(s) Educated Patient    Methods Explanation;Demonstration;Tactile cues;Verbal cues    Comprehension Returned demonstration;Verbalized understanding          Objective   Muscle tension B upper trap  Supine cervical rotation with neck flexed. Less discomfort with L cervical rotation   No latex allergies   MedbridgeAccess Code [email or web address]  when L neck feels tired, pt feels pain L medial scapula  Observation: B shoulder shrug when pt takes a deep breath    Manual therapy  seated STM R upper trap muscle to decrease tension   Try using edge tool next visit if appropriate.  Therapeutic exercise   Seated gentle eccentric R and L cervical rotation 10x3 each way with PT   Seated gentler eccentric L cervical rotation with PT 10x3  Seated scapular retraction manually resisted targeting the lower trap   R 10x5 seconds for 3 sets  Decreased pain with L cervical rotation   Standing R shoulder extension with scapular retraction yellow band 10x5 seconds for 3 sets  No neck pain with L cervical rotation afterwards  R lower trap raise at wall 10x5 seconds    Improved exercise technique, movement at target joints, use of target muscles after mod verbal, visual, tactile cues.  Response to treatment Decreased neck pain.   Clinical impression Decreased neck pain with L cervical rotation after treatment to decrease R upper trap muscle tension and improve R lower trap muscle strengthening. Pt will benefit from continued  skilled physical therapy services to decrease pain, improve strength and function.        PT Short Term Goals - 12/19/19 2237      PT SHORT TERM GOAL #1   Title Patient will be independent with her HEP to improve cervical and scapular strength, decrease pain, and improve ability to look around more comfortably.    Baseline Pt has started her HEP (11/06/2019); Pt has not really performed HEP yet per subjective (12/19/2019)    Time 3    Period Weeks    Status On-going    Target Date 01/09/20             PT Long Term Goals - 12/19/19 1455      PT LONG TERM GOAL #1   Title Patient will have a decrease in neck pain to 2/10 or less at worst to promote ability to look around more comfortably.    Baseline 6/10 neck pain at most (11/06/2019); 7/10 at most for the past 7 days (resting helps) (12/19/2019)    Time 2    Period Weeks    Status On-going    Target Date 01/02/20      PT LONG TERM GOAL #2   Title Patient will improve L cervical AROM to 60 degrees or more without complain of pain to promote ability to look around more comfortably.    Baseline 45 degrees L cervical rotation with pain (11/06/2019);  50 degrees L cervical rotation with a little pain; 65 degrees R (12/19/2019)    Time 2    Period Weeks    Status On-going    Target Date 01/02/20      PT LONG TERM GOAL #3   Title Patient will improve her neck FOTO score by at least 10 points as a demonstration of improved function.    Baseline neck FOTO: 63 (11/06/2019)    Time 2    Period Weeks    Status On-going    Target Date 01/02/20                 Plan - 12/25/19 1831    Clinical Impression Statement Decreased neck pain with L cervical rotation after treatment to decrease R upper trap muscle tension and improve R lower trap muscle strengthening. Pt will benefit from continued skilled physical therapy services to decrease pain, improve strength and function.    Personal Factors and Comorbidities Time since onset of  injury/illness/exacerbation    Examination-Activity Limitations Other   L cervical rotation   Stability/Clinical Decision Making Stable/Uncomplicated    Rehab Potential Fair    PT Frequency 2x / week  PT Duration 2 weeks    PT Treatment/Interventions Electrical Stimulation;Iontophoresis 4mg /ml Dexamethasone;Traction;Therapeutic activities;Therapeutic exercise;Neuromuscular re-education;Patient/family education;Manual techniques;Dry needling;Spinal Manipulations;Joint Manipulations    PT Next Visit Plan scapular and anterior cervical strengthening, manual techniques, modalities PRN    PT Home Exercise Plan Medbridge Access Code [email or web address]    Consulted and Agree with Plan of Care Patient           Patient will benefit from skilled therapeutic intervention in order to improve the following deficits and impairments:  Pain, Postural dysfunction, Improper body mechanics, Decreased strength, Decreased range of motion  Visit Diagnosis: Cervicalgia     Problem List Patient Active Problem List   Diagnosis Date Noted  . Degeneration of cervical intervertebral disc 10/10/2019  . Encounter for screening colonoscopy   . Atrophic vaginitis 07/23/2019  . Endometriosis determined by laparoscopy 07/19/2019  . Digital mucous cyst 02/22/2019  . [place] node 02/22/2019  . Synovial cyst of hand 02/18/2019  . Rectal bleeding 10/10/2018  . Marital conflict 74/16/3845  . Menopausal state 04/15/2018  . Perimenopause 09/12/2017  . Foot pain, left 04/08/2017  . IBS (irritable bowel syndrome) 04/05/2016  . Anxiety, generalized 12/02/2015  . Insomnia 04/05/2015  . Alkaline phosphatase elevation 03/21/2014  . Encounter for preventive health examination 02/17/2012  . Overweight (BMI 25.0-29.9) 10/10/2011  . Allergic rhinitis, seasonal   . GERD (gastroesophageal reflux disease)     Joneen Boers PT, DPT   12/25/2019, 6:33 PM  [facility] [address], [place], [address] Phone: [phone]   Fax:  [phone]  Name: Paula Jensen MRN: [id number] Date of Birth: 1965-06-02

## 2019-12-30 ENCOUNTER — Other Ambulatory Visit: Payer: Self-pay

## 2019-12-30 ENCOUNTER — Ambulatory Visit: Payer: No Typology Code available for payment source

## 2019-12-30 DIAGNOSIS — M542 Cervicalgia: Secondary | ICD-10-CM | POA: Diagnosis not present

## 2019-12-30 NOTE — Therapy (Signed)
[facility] [address], [place], [address] Phone: [phone]   Fax:  [phone]  Physical Therapy Treatment  Patient Details  Name: Paula Jensen MRN: [id number] Date of Birth: 1965/11/03 Referring Provider (PT): Deborra Medina, MD   Encounter Date: 12/30/2019   PT End of Session - 12/30/19 1731    Visit Number 7    Number of Visits 17    Date for PT Re-Evaluation 01/02/20    PT Start Time 7673    PT Stop Time 1836    PT Time Calculation (min) 65 min    Activity Tolerance Patient tolerated treatment well    Behavior During Therapy [facility] for tasks assessed/performed           Past Medical History:  Diagnosis Date  . allergic rhinitis   . Cancer ([place])    skin  . Family history of adverse reaction to anesthesia    Daughter - PONV  . GERD (gastroesophageal reflux disease)   . Wears contact lenses     Past Surgical History:  Procedure Laterality Date  . COLONOSCOPY WITH PROPOFOL N/A 08/23/2019   Procedure: COLONOSCOPY WITH PROPOFOL;  Surgeon: [name], MD;  Location: [facility];  Service: Endoscopy;  Laterality: N/A;  priority 4  . DILATION AND CURETTAGE OF UTERUS  June 2012   [place] , for heavy bleeding   . laparoscopy  1996  . TONSILLECTOMY AND ADENOIDECTOMY      There were no vitals filed for this visit.   Subjective Assessment - 12/30/19 1732    Subjective Neck is ok. Neck seem a little better. L medial shoulder blade is also sore. 2/10 L upper trap area pain currently.    Pertinent History Neck pain with radiculopathy. Pain began about 6 months ago. Has beend massaging her L upper trap. Pain mainly on L posterior lateral neck. Did a trigger point injection which helped some. Had an x-ray which revealed degenerative disc issues. No L UE pain, just localized L posterior lateral neck pain. Neck gets very tight at the end of the day. Feels like she needs to support her head to  take the pressure off the neck. Taking naproxen which helps. Pain has improved in some ways since onset 6 months ago.    Patient Stated Goals Have unlimited ROM for her neck    Currently in Pain? Yes    Pain Score 2     Pain Onset More than a month ago                                     PT Education - 12/30/19 1845    Education Details ther-ex    Person(s) Educated Patient    Methods Explanation;Demonstration;Tactile cues;Verbal cues    Comprehension Returned demonstration;Verbalized understanding          Objective   Muscle tension B upper trap  Supine cervical rotation with neck flexed. Less discomfort with L cervical rotation   No latex allergies   MedbridgeAccess Code [email or web address]  when L neck feels tired, pt feels pain L medial scapula  Observation: B shoulder shrug when pt takes a deep breath    Manual therapy  seated STM R upper trap and levator scapula muscle to decrease tension   Seated STM R cervical paraspinal muscles to decrease tension   Try using edge tool next visit if appropriate.  Therapeutic exercise   Seated scapular retraction manually resisted targeting the lower trap      R 10x5 seconds for 3 sets      Seated cervical rotation isometrics in neutral 10x5 second R and L for 2 sets  R cervical side bend compensation during resisted R cervical rotation isometrics  Seated R cervical rotation isometrics in neutral 10x5 seconds for 2 sets  demonstrates R side bend compensation   Then at L cervical rotation position 10x5 seconds for 3 sets   Seated L cervical rotation isometrics 10x5 seconds. Increased symptoms, eases with rest.   Seated scapular retraction manually resisted targeting the lower trap      R 10x5 seconds again       Improved exercise technique, movement at target joints, use of target muscles after mod verbal, visual, tactile cues.  Response to treatment Fair tolerance to  today's session.   Clinical impression Fair tolerance to today's session. Symptoms did not improve with improving R or L lower trap strength or decreasing R or L upper trap muscle tension today compared to previous session. Pt did well during previous session in which pt took an anti inflammatory medication prior to treatment. Possible inflammatory process involved. Pt was recommended to use ice for her neck and L upper trap 15 min at a time, 4-5x a day or ice massage to her neck for 3-5 min (but to keep an eye on it so she does not get ice burn) to help decrease pain. Pt verbalized understanding. Pt will benefit from continued skilled physical therapy services to decrease pain, improve strength and function.        PT Short Term Goals - 12/19/19 2237      PT SHORT TERM GOAL #1   Title Patient will be independent with her HEP to improve cervical and scapular strength, decrease pain, and improve ability to look around more comfortably.    Baseline Pt has started her HEP (11/06/2019); Pt has not really performed HEP yet per subjective (12/19/2019)    Time 3    Period Weeks    Status On-going    Target Date 01/09/20             PT Long Term Goals - 12/19/19 1455      PT LONG TERM GOAL #1   Title Patient will have a decrease in neck pain to 2/10 or less at worst to promote ability to look around more comfortably.    Baseline 6/10 neck pain at most (11/06/2019); 7/10 at most for the past 7 days (resting helps) (12/19/2019)    Time 2    Period Weeks    Status On-going    Target Date 01/02/20      PT LONG TERM GOAL #2   Title Patient will improve L cervical AROM to 60 degrees or more without complain of pain to promote ability to look around more comfortably.    Baseline 45 degrees L cervical rotation with pain (11/06/2019);  50 degrees L cervical rotation with a little pain; 65 degrees R (12/19/2019)    Time 2    Period Weeks    Status On-going    Target Date 01/02/20      PT LONG TERM  GOAL #3   Title Patient will improve her neck FOTO score by at least 10 points as a demonstration of improved function.    Baseline neck FOTO: 63 (11/06/2019)    Time 2    Period Weeks  Status On-going    Target Date 01/02/20                 Plan - 12/30/19 1841    Clinical Impression Statement Fair tolerance to today's session. Symptoms did not improve with improving R or L lower trap strength or decreasing R or L upper trap muscle tension today compared to previous session. Pt did well during previous session in which pt took an anti inflammatory medication prior to treatment. Possible inflammatory process involved. Pt was recommended to use ice for her neck and L upper trap 15 min at a time, 4-5x a day or ice massage to her neck for 3-5 min (but to keep an eye on it so she does not get ice burn) to help decrease pain. Pt verbalized understanding. Pt will benefit from continued skilled physical therapy services to decrease pain, improve strength and function.    Personal Factors and Comorbidities Time since onset of injury/illness/exacerbation    Examination-Activity Limitations Other   L cervical rotation   Stability/Clinical Decision Making Stable/Uncomplicated    Rehab Potential Fair    PT Frequency 2x / week    PT Duration 2 weeks    PT Treatment/Interventions Electrical Stimulation;Iontophoresis 4mg /ml Dexamethasone;Traction;Therapeutic activities;Therapeutic exercise;Neuromuscular re-education;Patient/family education;Manual techniques;Dry needling;Spinal Manipulations;Joint Manipulations    PT Next Visit Plan scapular and anterior cervical strengthening, manual techniques, modalities PRN    PT Home Exercise Plan Medbridge Access Code [email or web address]    Consulted and Agree with Plan of Care Patient           Patient will benefit from skilled therapeutic intervention in order to improve the following deficits and impairments:  Pain, Postural dysfunction, Improper body mechanics,  Decreased strength, Decreased range of motion  Visit Diagnosis: Cervicalgia     Problem List Patient Active Problem List   Diagnosis Date Noted  . Degeneration of cervical intervertebral disc 10/10/2019  . Encounter for screening colonoscopy   . Atrophic vaginitis 07/23/2019  . Endometriosis determined by laparoscopy 07/19/2019  . Digital mucous cyst 02/22/2019  . [place] node 02/22/2019  . Synovial cyst of hand 02/18/2019  . Rectal bleeding 10/10/2018  . Marital conflict 85/46/2703  . Menopausal state 04/15/2018  . Perimenopause 09/12/2017  . Foot pain, left 04/08/2017  . IBS (irritable bowel syndrome) 04/05/2016  . Anxiety, generalized 12/02/2015  . Insomnia 04/05/2015  . Alkaline phosphatase elevation 03/21/2014  . Encounter for preventive health examination 02/17/2012  . Overweight (BMI 25.0-29.9) 10/10/2011  . Allergic rhinitis, seasonal   . GERD (gastroesophageal reflux disease)     Joneen Boers PT, DPT   12/30/2019, 6:46 PM  [facility] [address] [place], [address] Phone: [phone]   Fax:  [phone]  Name: RUBBY BARBARY MRN: [id number] Date of Birth: 19-Sep-1965

## 2019-12-31 ENCOUNTER — Ambulatory Visit: Payer: No Typology Code available for payment source | Attending: Internal Medicine

## 2019-12-31 ENCOUNTER — Other Ambulatory Visit: Payer: Self-pay | Admitting: Internal Medicine

## 2019-12-31 DIAGNOSIS — Z23 Encounter for immunization: Secondary | ICD-10-CM

## 2019-12-31 NOTE — Progress Notes (Signed)
° °  Covid-19 Vaccination Clinic  Name:  FLOR WHITACRE    MRN: [id number] DOB: 1965-05-24  12/31/2019  Ms. Woodburn was observed post Covid-19 immunization for 15 minutes without incident. She was provided with Vaccine Information Sheet and instruction to access the V-Safe system.   Ms. Valko was instructed to call 911 with any severe reactions post vaccine:  Difficulty breathing   Swelling of face and throat   A fast heartbeat   A bad rash all over body   Dizziness and weakness

## 2020-01-02 ENCOUNTER — Ambulatory Visit: Payer: No Typology Code available for payment source

## 2020-01-02 ENCOUNTER — Other Ambulatory Visit: Payer: Self-pay

## 2020-01-02 DIAGNOSIS — M542 Cervicalgia: Secondary | ICD-10-CM | POA: Diagnosis not present

## 2020-01-02 NOTE — Therapy (Signed)
[facility] [address] [place], [address] Phone: [phone]   Fax:  [phone]  Physical Therapy Treatment And Discharge Summary  Patient Details  Name: Paula Jensen MRN: [id number] Date of Birth: 12-23-1965 Referring Provider (PT): Deborra Medina, MD   Encounter Date: 01/02/2020   PT End of Session - 01/02/20 1635    Visit Number 8    Number of Visits 17    Date for PT Re-Evaluation 01/02/20    PT Start Time 7902    PT Stop Time 1723    PT Time Calculation (min) 48 min    Activity Tolerance Patient tolerated treatment well    Behavior During Therapy [facility] for tasks assessed/performed           Past Medical History:  Diagnosis Date  . allergic rhinitis   . Cancer ([place])    skin  . Family history of adverse reaction to anesthesia    Daughter - PONV  . GERD (gastroesophageal reflux disease)   . Wears contact lenses     Past Surgical History:  Procedure Laterality Date  . COLONOSCOPY WITH PROPOFOL N/A 08/23/2019   Procedure: COLONOSCOPY WITH PROPOFOL;  Surgeon: [name], MD;  Location: [facility];  Service: Endoscopy;  Laterality: N/A;  priority 4  . DILATION AND CURETTAGE OF UTERUS  June 2012   [place] , for heavy bleeding   . laparoscopy  1996  . TONSILLECTOMY AND ADENOIDECTOMY      There were no vitals filed for this visit.   Subjective Assessment - 01/02/20 1637    Subjective Neck has been hurting. Back to taking the ibuprofen again. 4/10 currently    Pertinent History Neck pain with radiculopathy. Pain began about 6 months ago. Has beend massaging her L upper trap. Pain mainly on L posterior lateral neck. Did a trigger point injection which helped some. Had an x-ray which revealed degenerative disc issues. No L UE pain, just localized L posterior lateral neck pain. Neck gets very tight at the end of the day. Feels like she needs to support her head to take the pressure  off the neck. Taking naproxen which helps. Pain has improved in some ways since onset 6 months ago.    Patient Stated Goals Have unlimited ROM for her neck    Currently in Pain? Yes    Pain Score 4     Pain Onset More than a month ago                                     PT Education - 01/02/20 1643    Education Details ther-ex    Person(s) Educated Patient    Methods Explanation;Demonstration;Tactile cues;Verbal cues    Comprehension Returned demonstration;Verbalized understanding          Objective   Muscle tension B upper trap  Supine cervical rotation with neck flexed. Less discomfort with L cervical rotation   No latex allergies   MedbridgeAccess Code [email or web address]  when L neck feels tired, pt feels pain L medial scapula  Observation: B shoulder shrug when pt takes a deep breath   Therapeutic exercise  standing B scapular retraction: L medial scapular symptoms  Then with lumbar extension: L medial scapular symptoms radiates to L axilla  Standing trunk flexion: full, no L medial scapular pain afterwards  Standing trunk flexion 10x2   Decreased L  upper trap symptoms when pt cued to perform cervical flexion to prevent extension   Standing ankle DF/PF on rockerboard 2 minutes  Standing trunk flexion 10x  Standing cervical flexion 10x5 seconds for 2 sets   Then 10x10 seconds   Standing L lateral shift correction 10x5 seconds for 2 sets. Possible decrease in symptoms   Then 10x10 seconds. No decrease in symptoms  L Cervical AROM 1x  Standing L shoulder ER isometrincs in neutral 10x5 seconds for 3 sets     Improved exercise technique, movement at target joints, use of target muscles after mod verbal, visual, tactile cues.  Response to treatment Fair tolerance to today's session.   Clinical impression Worked more distal areas of body secondary to some irritability of symptoms. No overall improvement of symptoms  observed during today's session or overall since initial evaluation. Skilled physical therapy services discharged secondary to pt not progressing.      PT Short Term Goals - 01/02/20 1657      PT SHORT TERM GOAL #1   Title Patient will be independent with her HEP to improve cervical and scapular strength, decrease pain, and improve ability to look around more comfortably.    Baseline Pt has started her HEP (11/06/2019); Pt has not really performed HEP yet per subjective (12/19/2019); Pt able to perform some of her home exercises, no questions (01/02/2020)    Time 3    Period Weeks    Status Achieved    Target Date 01/09/20             PT Long Term Goals - 01/02/20 1658      PT LONG TERM GOAL #1   Title Patient will have a decrease in neck pain to 2/10 or less at worst to promote ability to look around more comfortably.    Baseline 6/10 neck pain at most (11/06/2019); 7/10 at most for the past 7 days (resting helps) (12/19/2019); 6/10 at most for the past 7 days (01/02/2020)    Time 2    Period Weeks    Status Not Met    Target Date 01/02/20      PT LONG TERM GOAL #2   Title Patient will improve L cervical AROM to 60 degrees or more without complain of pain to promote ability to look around more comfortably.    Baseline 45 degrees L cervical rotation with pain (11/06/2019);  50 degrees L cervical rotation with a little pain; 65 degrees R (12/19/2019); 40 degrees L AROM (01/02/2020)    Time 2    Period Weeks    Status Not Met    Target Date 01/02/20      PT LONG TERM GOAL #3   Title Patient will improve her neck FOTO score by at least 10 points as a demonstration of improved function.    Baseline neck FOTO: 63 (11/06/2019); 59 (01/02/2020)    Time 2    Period Weeks    Status Not Met    Target Date 01/02/20                 Plan - 01/02/20 1644    Clinical Impression Statement Worked more distal areas of body secondary to some irritability of symptoms. No overall  improvement of symptoms observed during today's session or overall since initial evaluation. Skilled physical therapy services discharged secondary to pt not progressing.    Personal Factors and Comorbidities Time since onset of injury/illness/exacerbation    Examination-Activity Limitations Other   L cervical rotation  Stability/Clinical Decision Making --    Rehab Potential Fair    PT Frequency --    PT Duration --    PT Treatment/Interventions Therapeutic activities;Therapeutic exercise;Neuromuscular re-education;Patient/family education;Manual techniques;Electrical Stimulation;Traction    PT Next Visit Plan --    PT Home Exercise Plan Medbridge Access Code [email or web address]    Consulted and Agree with Plan of Care Patient           Patient will benefit from skilled therapeutic intervention in order to improve the following deficits and impairments:  Pain, Postural dysfunction, Improper body mechanics, Decreased strength, Decreased range of motion  Visit Diagnosis: Cervicalgia     Problem List Patient Active Problem List   Diagnosis Date Noted  . Degeneration of cervical intervertebral disc 10/10/2019  . Encounter for screening colonoscopy   . Atrophic vaginitis 07/23/2019  . Endometriosis determined by laparoscopy 07/19/2019  . Digital mucous cyst 02/22/2019  . [place] node 02/22/2019  . Synovial cyst of hand 02/18/2019  . Rectal bleeding 10/10/2018  . Marital conflict 30/11/2328  . Menopausal state 04/15/2018  . Perimenopause 09/12/2017  . Foot pain, left 04/08/2017  . IBS (irritable bowel syndrome) 04/05/2016  . Anxiety, generalized 12/02/2015  . Insomnia 04/05/2015  . Alkaline phosphatase elevation 03/21/2014  . Encounter for preventive health examination 02/17/2012  . Overweight (BMI 25.0-29.9) 10/10/2011  . Allergic rhinitis, seasonal   . GERD (gastroesophageal reflux disease)     Thank you for your referral.  Joneen Boers PT, DPT   01/02/2020, 5:45 PM  [facility] [address], [place], [address] Phone: [phone]   Fax:  [phone]  Name: KOURTNI STINEMAN MRN: [id number] Date of Birth: 1965-12-07

## 2020-01-03 ENCOUNTER — Ambulatory Visit: Payer: Self-pay

## 2020-01-17 ENCOUNTER — Encounter: Payer: Self-pay | Admitting: Internal Medicine

## 2020-01-17 ENCOUNTER — Other Ambulatory Visit: Payer: Self-pay

## 2020-01-17 ENCOUNTER — Ambulatory Visit (INDEPENDENT_AMBULATORY_CARE_PROVIDER_SITE_OTHER): Payer: No Typology Code available for payment source | Admitting: Internal Medicine

## 2020-01-17 ENCOUNTER — Other Ambulatory Visit: Payer: Self-pay | Admitting: Internal Medicine

## 2020-01-17 VITALS — BP 110/74 | HR 84 | Temp 98.8°F | Ht 68.0 in | Wt 166.0 lb

## 2020-01-17 DIAGNOSIS — M5030 Other cervical disc degeneration, unspecified cervical region: Secondary | ICD-10-CM | POA: Diagnosis not present

## 2020-01-17 DIAGNOSIS — M62838 Other muscle spasm: Secondary | ICD-10-CM | POA: Diagnosis not present

## 2020-01-17 MED ORDER — PREDNISONE 10 MG PO TABS: ORAL_TABLET | ORAL | 0 refills | Status: DC

## 2020-01-17 MED ORDER — TIZANIDINE HCL 2 MG PO CAPS: 2.0000 mg | ORAL_CAPSULE | Freq: Three times a day (TID) | ORAL | 1 refills | Status: DC | PRN

## 2020-01-17 NOTE — Progress Notes (Signed)
Subjective:  Patient ID: Paula Jensen, female    DOB: 1965/06/07  Age: 54 y.o. MRN: [id number]  CC: The primary encounter diagnosis was Degeneration of cervical intervertebral disc. A diagnosis of Neck muscle spasm was also pertinent to this visit.  HPI Paula Jensen presents for follow up on left sided neck and shoulder pain with muscle spasm  This visit occurred during the SARS-CoV-2 public health emergency.  Safety protocols were in place, including screening questions prior to the visit, additional usage of staff PPE, and extensive cleaning of exam room while observing appropriate contact time as indicated for disinfecting solutions.    Patient has received both doses of the available COVID 19 vaccine without complications.  Patient continues to mask when outside of the home except when walking in yard or at safe distances from others .  Patient denies any change in mood or development of unhealthy behaviors resuting from the pandemic's restriction of activities and socialization.    She has had several weeks of PT with no significant change in her pain.  The pain does not radiate to arm,  But certain exercises the therapist gave her would result in pain radiating down the later side of the cervical spine and invoolve the scapula.  Occasional shooting pain to left side of scalp in C3 distribution   Patient still participating in Yoga .  Has stopped doing headstands.   She does not want an MRI.  Outpatient Medications Prior to Visit  Medication Sig Dispense Refill  . ALPRAZolam (XANAX) 0.25 MG tablet TAKE 1 TABLET BY MOUTH AT BEDTIME AS NEEDED. 30 tablet 1  . citalopram (CELEXA) 40 MG tablet Take 1 tablet (40 mg total) by mouth daily. 90 tablet 3  . clindamycin (CLINDAGEL) 1 % gel Apply topically 2 (two) times daily. 30 g 5  . doxycycline (ADOXA) 50 MG tablet Take 1 tablet (50 mg total) by mouth daily. 30 tablet 6  . estradiol (ESTRACE) 0.1 MG/GM vaginal cream Insert 1 gram  intravaginally at bedtime daily for 2 weeks,  Then reduce use to twice weekly thereafter 42.5 g 12  . Magnesium 500 MG CAPS Take 2 capsules by mouth at bedtime.    . triamcinolone cream (KENALOG) 0.1 % Apply 1 application topically 2 (two) times daily. 30 g 0  . zolpidem (AMBIEN CR) 6.25 MG CR tablet TAKE 1 TABLET BY MOUTH AT BEDTIME AS NEEDED FOR SLEEP 30 tablet 3  . tizanidine (ZANAFLEX) 2 MG capsule Take 1 capsule (2 mg total) by mouth 3 (three) times daily as needed for muscle spasms. 60 capsule 1  . naproxen (EC NAPROSYN) 500 MG EC tablet TAKE 1 TABLET BY MOUTH 2 TIMES DAILY WITH A MEAL. (Patient not taking: Reported on 01/17/2020) 60 tablet 1  . pravastatin (PRAVACHOL) 20 MG tablet TAKE 1 TABLET BY MOUTH DAILY. (Patient not taking: Reported on 01/17/2020) 90 tablet 3  . vitamin E (VITAMIN E) 400 UNIT capsule Take 2 capsules (800 Units total) by mouth daily. (Patient not taking: Reported on 01/17/2020) 60 capsule 11   No facility-administered medications prior to visit.    Review of Systems;  Patient denies headache, fevers, malaise, unintentional weight loss, skin rash, eye pain, sinus congestion and sinus pain, sore throat, dysphagia,  hemoptysis , cough, dyspnea, wheezing, chest pain, palpitations, orthopnea, edema, abdominal pain, nausea, melena, diarrhea, constipation, flank pain, dysuria, hematuria, urinary  Frequency, nocturia, numbness, tingling, seizures,  Focal weakness, Loss of consciousness,  Tremor, insomnia, depression, anxiety, and suicidal  ideation.      Objective:  BP 110/74   Pulse 84   Temp 98.8 F (37.1 C)   Ht 5\' 8"  (1.727 m)   Wt 166 lb (75.3 kg)   LMP 05/05/2016 (Approximate)   SpO2 96%   BMI 25.24 kg/m   BP Readings from Last 3 Encounters:  01/17/20 110/74  08/23/19 (!) 114/94  07/19/19 110/68    Wt Readings from Last 3 Encounters:  01/17/20 166 lb (75.3 kg)  08/23/19 178 lb (80.7 kg)  07/19/19 183 lb 3.2 oz (83.1 kg)    General appearance:  alert, cooperative and appears stated age Ears: normal TM's and external ear canals both ears Throat: lips, mucosa, and tongue normal; teeth and gums normal Neck: no adenopathy, no carotid bruit, supple, symmetrical, trachea midline and thyroid not enlarged, symmetric, no tenderness/mass/nodules Back: symmetric, no curvature. ROM normal. No CVA tenderness. Lungs: clear to auscultation bilaterally Heart: regular rate and rhythm, S1, S2 normal, no murmur, click, rub or gallop Abdomen: soft, non-tender; bowel sounds normal; no masses,  no organomegaly Pulses: 2+ and symmetric Skin: Skin color, texture, turgor normal. No rashes or lesions Lymph nodes: Cervical, supraclavicular, and axillary nodes normal. Neuro: CNs 2-12 intact. DTRs 2+/4 in biceps, brachioradialis, patellars and achilles. Muscle strength 5/5 in upper and lower exremities. Fine resting tremor bilaterally both hands cerebellar function normal. Romberg negative.  No pronator drift.   Gait normal.   No results found for: HGBA1C  Lab Results  Component Value Date   CREATININE 0.73 07/19/2019   CREATININE 0.83 10/22/2018   CREATININE 0.76 04/13/2018    Lab Results  Component Value Date   WBC 7.5 10/22/2018   HGB 14.1 10/22/2018   HCT 42.7 10/22/2018   PLT 209 10/22/2018   GLUCOSE 103 (H) 07/19/2019   CHOL 177 07/19/2019   TRIG 75.0 07/19/2019   HDL 84.40 07/19/2019   LDLDIRECT 107.1 03/28/2013   LDLCALC 77 07/19/2019   ALT 14 09/17/2019   AST 14 09/17/2019   NA 139 07/19/2019   K 4.0 07/19/2019   CL 103 07/19/2019   CREATININE 0.73 07/19/2019   BUN 24 (H) 07/19/2019   CO2 27 07/19/2019   TSH 0.92 07/19/2019   INR 1.0 10/22/2018   INR 1.0 10/22/2018    DG Cervical Spine Complete  Result Date: 10/09/2019 CLINICAL DATA:  LEFT side neck pain radiating to LEFT arm with head turning for 3-4 months, no known injury EXAM: CERVICAL SPINE - COMPLETE 4+ VIEW COMPARISON:  None FINDINGS: Slight reversal of cervical lordosis  question muscle spasm. Prevertebral soft tissues normal thickness. Disc space narrowing and endplate spur formation at C4-C5 through C6-C7. Multilevel facet degenerative changes. Minimal anterolisthesis C3-C4. No fracture, additional subluxation, or bone destruction. Uncovertebral spurs encroach upon cervical neural foramina bilaterally at C6-C7 and to lesser degree C5-C6. Lung apices clear. IMPRESSION: Multilevel degenerative disc and facet disease changes of the cervical spine with uncovertebral spurs encroaching upon cervical neural foramina bilaterally greatest at C6-C7. Electronically Signed   By: Lavonia Dana M.D.   On: 10/09/2019 17:32    Assessment & Plan:   Problem List Items Addressed This Visit      Unprioritized   Degeneration of cervical intervertebral disc - Primary    Her pain has not improved with PT.. she has no neurologic deficitis or radiculitis so an MRI is not needed.  Sports medicine referral advised rather that patient preferred chiropractic therapy       Relevant Medications   predniSONE (  DELTASONE) 10 MG tablet   tizanidine (ZANAFLEX) 2 MG capsule   Other Relevant Orders   Ambulatory referral to Sports Medicine    Other Visit Diagnoses    Neck muscle spasm       Relevant Medications   tizanidine (ZANAFLEX) 2 MG capsule   Other Relevant Orders   Ambulatory referral to Sports Medicine      I have discontinued Tasheena G. Sharlett Iles "Lori"'s vitamin E, pravastatin, and naproxen. I am also having her start on predniSONE. Additionally, I am having her maintain her Magnesium, clindamycin, triamcinolone cream, doxycycline, citalopram, ALPRAZolam, estradiol, zolpidem, and tizanidine.  Meds ordered this encounter  Medications  . predniSONE (DELTASONE) 10 MG tablet    Sig: 6 tablets daily for 3 days, then reduce by 1 tablet daily until gone    Dispense:  33 tablet    Refill:  0  . tizanidine (ZANAFLEX) 2 MG capsule    Sig: Take 1 capsule (2 mg total) by mouth 3 (three)  times daily as needed for muscle spasms.    Dispense:  60 capsule    Refill:  1    Medications Discontinued During This Encounter  Medication Reason  . vitamin E (VITAMIN E) 400 UNIT capsule Patient Preference  . pravastatin (PRAVACHOL) 20 MG tablet Patient has not taken in last 30 days  . naproxen (EC NAPROSYN) 500 MG EC tablet Completed Course  . tizanidine (ZANAFLEX) 2 MG capsule Reorder    Follow-up: No follow-ups on file.   Crecencio Mc, MD

## 2020-01-19 NOTE — Assessment & Plan Note (Signed)
Her pain has not improved with PT.. she has no neurologic deficitis or radiculitis so an MRI is not needed.  Sports medicine referral advised rather that patient preferred chiropractic therapy

## 2020-01-24 ENCOUNTER — Ambulatory Visit: Payer: No Typology Code available for payment source | Admitting: Internal Medicine

## 2020-01-29 NOTE — Progress Notes (Signed)
[facility] Phone: [phone] Subjective:   [name], am serving as a scribe for Dr. Hulan Saas. This visit occurred during the SARS-CoV-2 public health emergency.  Safety protocols were in place, including screening questions prior to the visit, additional usage of staff PPE, and extensive cleaning of exam room while observing appropriate contact time as indicated for disinfecting solutions.   I'm seeing this patient by the request  of:  Crecencio Mc, MD  CC: Neck pain  [id number]  Paula Jensen is a 54 y.o. female coming in with complaint of cervical spine pain. Patient states that she has been having pain for 6 months. Patient feels that her posture is affecting her pain. Has tried trigger point injections which did not help. Patient tried 6 weeks of PT. Patient feels that therapy did help but did not completely alleviate pain. Tried round of prednisone. Pain is mostly on left side of neck which can radiate up into the back of her head.   Xray cervical 10/08/2019 IMPRESSION: Multilevel degenerative disc and facet disease changes of the cervical spine with uncovertebral spurs encroaching upon cervical neural foramina bilaterally greatest at C6-C7.      Past Medical History:  Diagnosis Date  . allergic rhinitis   . Cancer ([facility])    skin  . Family history of adverse reaction to anesthesia    Daughter - PONV  . GERD (gastroesophageal reflux disease)   . Wears contact lenses    Past Surgical History:  Procedure Laterality Date  . COLONOSCOPY WITH PROPOFOL N/A 08/23/2019   Procedure: COLONOSCOPY WITH PROPOFOL;  Surgeon: [name], MD;  Location: [facility];  Service: Endoscopy;  Laterality: N/A;  priority 4  . DILATION AND CURETTAGE OF UTERUS  June 2012   [place] , for heavy bleeding   . laparoscopy  1996  . TONSILLECTOMY AND ADENOIDECTOMY     Social History   Socioeconomic  History  . Marital status: Married    Spouse name: Not on file  . Number of children: Not on file  . Years of education: Not on file  . Highest education level: Not on file  Occupational History  . Not on file  Tobacco Use  . Smoking status: Light Tobacco Smoker  . Smokeless tobacco: Never Used  . Tobacco comment: has occasional "social" cigarette  Vaping Use  . Vaping Use: Never used  Substance and Sexual Activity  . Alcohol use: Yes    Alcohol/week: 10.0 standard drinks    Types: 10 Glasses of wine per week  . Drug use: No  . Sexual activity: Yes  Other Topics Concern  . Not on file  Social History Narrative  . Not on file   Social Determinants of Health   Financial Resource Strain:   . Difficulty of Paying Living Expenses: Not on file  Food Insecurity:   . Worried About Charity fundraiser in the Last Year: Not on file  . Ran Out of Food in the Last Year: Not on file  Transportation Needs:   . Lack of Transportation (Medical): Not on file  . Lack of Transportation (Non-Medical): Not on file  Physical Activity:   . Days of Exercise per Week: Not on file  . Minutes of Exercise per Session: Not on file  Stress:   . Feeling of Stress : Not on file  Social Connections:   . Frequency of Communication with Friends and Family:  Not on file  . Frequency of Social Gatherings with Friends and Family: Not on file  . Attends Religious Services: Not on file  . Active Member of Clubs or Organizations: Not on file  . Attends Archivist Meetings: Not on file  . Marital Status: Not on file   No Known Allergies Family History  Problem Relation Age of Onset  . Hypertension Mother   . Atrial fibrillation Father   . Cancer Neg Hx   . Breast cancer Neg Hx     Current Outpatient Medications (Endocrine & Metabolic):  .  predniSONE (DELTASONE) 10 MG tablet, 6 tablets daily for 3 days, then reduce by 1 tablet daily until gone      Current Outpatient Medications  (Other):  Marland Kitchen  ALPRAZolam (XANAX) 0.25 MG tablet, TAKE 1 TABLET BY MOUTH AT BEDTIME AS NEEDED. .  citalopram (CELEXA) 40 MG tablet, Take 1 tablet (40 mg total) by mouth daily. .  clindamycin (CLINDAGEL) 1 % gel, Apply topically 2 (two) times daily. Marland Kitchen  doxycycline (ADOXA) 50 MG tablet, Take 1 tablet (50 mg total) by mouth daily. Marland Kitchen  estradiol (ESTRACE) 0.1 MG/GM vaginal cream, Insert 1 gram intravaginally at bedtime daily for 2 weeks,  Then reduce use to twice weekly thereafter .  Magnesium 500 MG CAPS, Take 2 capsules by mouth at bedtime. .  tizanidine (ZANAFLEX) 2 MG capsule, Take 1 capsule (2 mg total) by mouth 3 (three) times daily as needed for muscle spasms. Marland Kitchen  triamcinolone cream (KENALOG) 0.1 %, Apply 1 application topically 2 (two) times daily. Marland Kitchen  zolpidem (AMBIEN CR) 6.25 MG CR tablet, TAKE 1 TABLET BY MOUTH AT BEDTIME AS NEEDED FOR SLEEP .  gabapentin (NEURONTIN) 100 MG capsule, Take 2 capsules (200 mg total) by mouth at bedtime.   Reviewed prior external information including notes and imaging from  primary care provider As well as notes that were available from care everywhere and other healthcare systems.  Past medical history, social, surgical and family history all reviewed in electronic medical record.  No pertanent information unless stated regarding to the chief complaint.   Review of Systems:  No headache, visual changes, nausea, vomiting, diarrhea, constipation, dizziness, abdominal pain, skin rash, fevers, chills, night sweats, weight loss, swollen lymph nodes, body aches, joint swelling, chest pain, shortness of breath, mood changes. POSITIVE muscle aches  Objective  Blood pressure 128/88, height 5\' 8"  (1.727 m), weight 169 lb (76.7 kg), last menstrual period 05/05/2016.   General: No apparent distress alert and oriented x3 mood and affect normal, dressed appropriately.  HEENT: Pupils equal, extraocular movements intact  Respiratory: Patient's speak in full sentences and  does not appear short of breath  Cardiovascular: No lower extremity edema, non tender, no erythema  Neuro: Cranial nerves II through XII are intact, neurovascularly intact in all extremities with 2+ DTRs and 2+ pulses.  Gait normal with good balance and coordination.  Neck exam does have some loss of lordosis.  Lacks last 5 degrees of flexion in the last 10 degrees of extension.  Limited sidebending and limited rotation to the left.  Patient has a negative Spurling's.  5 out of 5 strength of the upper extremities, deep tendon reflexes are intact.  Osteopathic findings C2 flexed rotated and side bent left C6 flexed rotated and side bent left T4 extended rotated and side bent left    Impression and Recommendations:     The above documentation has been reviewed and is accurate and complete Olevia Bowens  Tamala Julian, DO

## 2020-02-03 ENCOUNTER — Other Ambulatory Visit: Payer: Self-pay

## 2020-02-03 ENCOUNTER — Encounter: Payer: Self-pay | Admitting: Family Medicine

## 2020-02-03 ENCOUNTER — Other Ambulatory Visit: Payer: Self-pay | Admitting: Family Medicine

## 2020-02-03 ENCOUNTER — Ambulatory Visit: Payer: No Typology Code available for payment source | Admitting: Family Medicine

## 2020-02-03 DIAGNOSIS — M999 Biomechanical lesion, unspecified: Secondary | ICD-10-CM

## 2020-02-03 DIAGNOSIS — M5030 Other cervical disc degeneration, unspecified cervical region: Secondary | ICD-10-CM

## 2020-02-03 MED ORDER — GABAPENTIN 100 MG PO CAPS: 200.0000 mg | ORAL_CAPSULE | Freq: Every day | ORAL | 0 refills | Status: DC

## 2020-02-03 NOTE — Assessment & Plan Note (Signed)
Moderate to severe noted.  Patient does have some mild limited range of motion with flexion and extension.  Patient also has some difficulty with compression.  Patient though does not have any true radicular symptoms.  Attempted osteopathic manipulation with very minimal improvement at the moment.  Patient is taking Zanaflex intermittently and was started on gabapentin.  Ergonomics could be playing a role in the outpatient of note for digestible standing desk.  Worsening symptoms would consider the possibility of decreasing patient's Celexa and starting Effexor for during the day.  Patient is in agreement with the plan and will follow up with me again 4 to 6 weeks

## 2020-02-03 NOTE — Patient Instructions (Signed)
  Gabapentin 200mg  at night Ice 20 mins 2x a day Tennis balls in tube sock Keep hands in peripheral vision See me again in 5-6 weeks

## 2020-02-03 NOTE — Assessment & Plan Note (Signed)
° °  Decision today to treat with OMT was based on Physical Exam  After verbal consent patient was treated with HVLA, ME, FPR techniques in cervical, thoracic,  areas, all areas are chronic   Patient tolerated the procedure well with improvement in symptoms  Patient given exercises, stretches and lifestyle modifications  See medications in patient instructions if given  Patient will follow up in 4-8 weeks

## 2020-02-21 ENCOUNTER — Ambulatory Visit: Payer: No Typology Code available for payment source

## 2020-03-06 ENCOUNTER — Other Ambulatory Visit: Payer: Self-pay | Admitting: Internal Medicine

## 2020-03-12 ENCOUNTER — Ambulatory Visit: Payer: No Typology Code available for payment source | Admitting: Family Medicine

## 2020-03-24 ENCOUNTER — Other Ambulatory Visit: Payer: Self-pay | Admitting: Internal Medicine

## 2020-03-25 ENCOUNTER — Other Ambulatory Visit: Payer: Self-pay | Admitting: Internal Medicine

## 2020-03-25 NOTE — Telephone Encounter (Signed)
RX Refill:xanax Last Seen:01-17-20 Last ordered:06-12-19

## 2020-03-26 NOTE — Progress Notes (Unsigned)
[facility] [address] [place] [address] Phone: [phone] Subjective:   I Paula Jensen am serving as a Neurosurgeon for Dr. Antoine Primas.  This visit occurred during the SARS-CoV-2 public health emergency.  Safety protocols were in place, including screening questions prior to the visit, additional usage of staff PPE, and extensive cleaning of exam room while observing appropriate contact time as indicated for disinfecting solutions.   I'm seeing this patient by the request  of:  Sherlene Shams, MD  CC: Neck pain follow-up  [id number]   02/03/2020 Moderate to severe noted.  Patient does have some mild limited range of motion with flexion and extension.  Patient also has some difficulty with compression.  Patient though does not have any true radicular symptoms.  Attempted osteopathic manipulation with very minimal improvement at the moment.  Patient is taking Zanaflex intermittently and was started on gabapentin.  Ergonomics could be playing a role in the outpatient of note for digestible standing desk.  Worsening symptoms would consider the possibility of decreasing patient's Celexa and starting Effexor for during the day.  Patient is in agreement with the plan and will follow up with me again 4 to 6 weeks   Update 03/27/2020 Paula Jensen is a 55 y.o. female coming in with complaint of neck pain. States she believes she has improved. Still sharp pain with turning her head. Muscle tightness has improved.  Patient still has pain on a daily basis.  Seems to get worse throughout the day.  Patient has tried to work on Doctor, hospital.  Continuing on the gabapentin but does not know how much that is helping.  Doing the exercises when she can.    Past Medical History:  Diagnosis Date  . allergic rhinitis   . Cancer ([facility])    skin  . Family history of adverse reaction to anesthesia    Daughter - PONV  . GERD (gastroesophageal reflux disease)   .  Wears contact lenses    Past Surgical History:  Procedure Laterality Date  . COLONOSCOPY WITH PROPOFOL N/A 08/23/2019   Procedure: COLONOSCOPY WITH PROPOFOL;  Surgeon: Midge Minium, MD;  Location: [facility];  Service: Endoscopy;  Laterality: N/A;  priority 4  . DILATION AND CURETTAGE OF UTERUS  June 2012   [place] , for heavy bleeding   . laparoscopy  1996  . TONSILLECTOMY AND ADENOIDECTOMY     Social History   Socioeconomic History  . Marital status: Married    Spouse name: Not on file  . Number of children: Not on file  . Years of education: Not on file  . Highest education level: Not on file  Occupational History  . Not on file  Tobacco Use  . Smoking status: Light Tobacco Smoker  . Smokeless tobacco: Never Used  . Tobacco comment: has occasional "social" cigarette  Vaping Use  . Vaping Use: Never used  Substance and Sexual Activity  . Alcohol use: Yes    Alcohol/week: 10.0 standard drinks    Types: 10 Glasses of wine per week  . Drug use: No  . Sexual activity: Yes  Other Topics Concern  . Not on file  Social History Narrative  . Not on file   Social Determinants of Health   Financial Resource Strain: Not on file  Food Insecurity: Not on file  Transportation Needs: Not on file  Physical Activity: Not on file  Stress: Not on file  Social Connections: Not on file  No Known Allergies Family History  Problem Relation Age of Onset  . Hypertension Mother   . Atrial fibrillation Father   . Cancer Neg Hx   . Breast cancer Neg Hx     Current Outpatient Medications (Endocrine & Metabolic):  .  predniSONE (DELTASONE) 10 MG tablet, 6 tablets daily for 3 days, then reduce by 1 tablet daily until gone      Current Outpatient Medications (Other):  Marland Kitchen  ALPRAZolam (XANAX) 0.25 MG tablet, TAKE 1 TABLET BY MOUTH AT BEDTIME AS NEEDED. .  citalopram (CELEXA) 40 MG tablet, TAKE 1 TABLET BY MOUTH DAILY. .  clindamycin (CLINDAGEL) 1 % gel, Apply topically 2 (two)  times daily. Marland Kitchen  doxycycline (ADOXA) 50 MG tablet, Take 1 tablet (50 mg total) by mouth daily. Marland Kitchen  estradiol (ESTRACE) 0.1 MG/GM vaginal cream, Insert 1 gram intravaginally at bedtime daily for 2 weeks,  Then reduce use to twice weekly thereafter .  gabapentin (NEURONTIN) 100 MG capsule, Take 2 capsules (200 mg total) by mouth at bedtime. .  Magnesium 500 MG CAPS, Take 2 capsules by mouth at bedtime. .  tizanidine (ZANAFLEX) 2 MG capsule, Take 1 capsule (2 mg total) by mouth 3 (three) times daily as needed for muscle spasms. Marland Kitchen  triamcinolone cream (KENALOG) 0.1 %, Apply 1 application topically 2 (two) times daily. Marland Kitchen  zolpidem (AMBIEN CR) 6.25 MG CR tablet, TAKE 1 TABLET BY MOUTH AT BEDTIME AS NEEDED FOR SLEEP   Reviewed prior external information including notes and imaging from  primary care provider As well as notes that were available from care everywhere and other healthcare systems.  Past medical history, social, surgical and family history all reviewed in electronic medical record.  No pertanent information unless stated regarding to the chief complaint.   Review of Systems:  No headache, visual changes, nausea, vomiting, diarrhea, constipation, dizziness, abdominal pain, skin rash, fevers, chills, night sweats, weight loss, swollen lymph nodes, body aches, joint swelling, chest pain, shortness of breath, mood changes. POSITIVE muscle aches  Objective  Blood pressure 118/80, pulse 78, height 5\' 8"  (1.727 m), weight 164 lb (74.4 kg), last menstrual period 05/05/2016, SpO2 97 %.   General: No apparent distress alert and oriented x3 mood and affect normal, dressed appropriately.  HEENT: Pupils equal, extraocular movements intact  Respiratory: Patient's speak in full sentences and does not appear short of breath  Cardiovascular: No lower extremity edema, non tender, no erythema  Neuro: Cranial nerves II through XII are intact, neurovascularly intact in all extremities with 2+ DTRs and 2+  pulses.  Gait normal with good balance and coordination.  MSK: Patient actually appears tighter than last exam.  Patient does have a positive Spurling's with in the C7 and C6 distribution on the left side.  Likely no weakness noted of the arm and neurovascularly intact distally.  Patient has limited left-sided rotation and side bending of the neck fairly severe.    Impression and Recommendations:     The above documentation has been reviewed and is accurate and complete Lyndal Pulley, DO

## 2020-03-27 ENCOUNTER — Other Ambulatory Visit: Payer: Self-pay

## 2020-03-27 ENCOUNTER — Ambulatory Visit: Payer: No Typology Code available for payment source | Admitting: Family Medicine

## 2020-03-27 ENCOUNTER — Encounter: Payer: Self-pay | Admitting: Family Medicine

## 2020-03-27 VITALS — BP 118/80 | HR 78 | Ht 68.0 in | Wt 164.0 lb

## 2020-03-27 DIAGNOSIS — M5030 Other cervical disc degeneration, unspecified cervical region: Secondary | ICD-10-CM | POA: Diagnosis not present

## 2020-03-27 DIAGNOSIS — M542 Cervicalgia: Secondary | ICD-10-CM | POA: Diagnosis not present

## 2020-03-27 NOTE — Assessment & Plan Note (Signed)
Significant arthritic changes.  Unfortunately none the arthritis noted to correspond with patient's symptoms.  Patient does have a positive Spurling's on the left side.  Patient is taking the medication and I do not think changing her medications at this point would be beneficial.  I do feel an MRI is necessary at this time for further evaluation.  Depending on this patient could be a candidate for epidurals.  Patient does want to avoid it if possible with her working in pain management but I think I do need the further imaging to assess appropriately.  Depending on imaging we will discuss further treatment options.

## 2020-03-27 NOTE — Patient Instructions (Addendum)
Good to see you MRI of the neck  Depending on imaging we will talk about epidural Wait for results we will discuss

## 2020-04-03 ENCOUNTER — Encounter: Payer: Self-pay | Admitting: Family Medicine

## 2020-04-08 ENCOUNTER — Other Ambulatory Visit: Payer: Self-pay | Admitting: Internal Medicine

## 2020-04-20 ENCOUNTER — Ambulatory Visit
Admission: RE | Admit: 2020-04-20 | Discharge: 2020-04-20 | Disposition: A | Discharge status: Home / Self Care | Payer: No Typology Code available for payment source | Source: Ambulatory Visit | Attending: Family Medicine | Admitting: Family Medicine

## 2020-04-20 ENCOUNTER — Other Ambulatory Visit: Payer: Self-pay

## 2020-04-20 DIAGNOSIS — M542 Cervicalgia: Secondary | ICD-10-CM | POA: Diagnosis not present

## 2020-04-21 ENCOUNTER — Other Ambulatory Visit: Payer: Self-pay

## 2020-04-21 ENCOUNTER — Encounter: Payer: Self-pay | Admitting: Family Medicine

## 2020-04-21 DIAGNOSIS — M542 Cervicalgia: Secondary | ICD-10-CM

## 2020-04-28 ENCOUNTER — Encounter: Payer: Self-pay | Admitting: Student in an Organized Health Care Education/Training Program

## 2020-04-28 ENCOUNTER — Other Ambulatory Visit: Payer: Self-pay

## 2020-04-28 ENCOUNTER — Ambulatory Visit (HOSPITAL_BASED_OUTPATIENT_CLINIC_OR_DEPARTMENT_OTHER)
Payer: No Typology Code available for payment source | Admitting: Student in an Organized Health Care Education/Training Program

## 2020-04-28 DIAGNOSIS — M47812 Spondylosis without myelopathy or radiculopathy, cervical region: Secondary | ICD-10-CM | POA: Diagnosis not present

## 2020-04-28 DIAGNOSIS — M4802 Spinal stenosis, cervical region: Secondary | ICD-10-CM | POA: Diagnosis not present

## 2020-04-28 DIAGNOSIS — M5030 Other cervical disc degeneration, unspecified cervical region: Secondary | ICD-10-CM

## 2020-04-28 DIAGNOSIS — M5412 Radiculopathy, cervical region: Secondary | ICD-10-CM | POA: Insufficient documentation

## 2020-04-28 NOTE — Progress Notes (Signed)
Patient: Paula Jensen  Service Category: E/M  Provider: Gillis Santa, MD  DOB: May 23, 1965  DOS: 04/28/2020  Referring Provider: Crecencio Mc, MD  MRN: [id number]  Setting: Ambulatory outpatient  PCP: Crecencio Mc, MD  Type: New Patient  Specialty: Interventional Pain Management    Location: Office  Delivery: Face-to-face     Primary Reason(s) for Visit: Encounter for initial evaluation of one or more chronic problems (new to examiner) potentially causing chronic pain, and posing a threat to normal musculoskeletal function. (Level of risk: High) CC: No chief complaint on file.  HPI  Paula Jensen is a 55 y.o. year old, female patient, who comes for the first time to our practice referred by Crecencio Mc, MD for our initial evaluation of her chronic pain. She has Allergic rhinitis, seasonal; GERD (gastroesophageal reflux disease); Overweight (BMI 25.0-29.9); Encounter for preventive health examination; Alkaline phosphatase elevation; Insomnia; Anxiety, generalized; IBS (irritable bowel syndrome); Foot pain, left; Perimenopause; Marital conflict; Menopausal state; Rectal bleeding; Synovial cyst of hand; Endometriosis determined by laparoscopy; Atrophic vaginitis; Digital mucous cyst; Heberden node; Encounter for screening colonoscopy; Cervical spondylosis; Nonallopathic lesion of cervical region; Neuroforaminal stenosis of cervical spine; and Cervical radicular pain (left C4,5,6) on their problem list. Today she comes in for evaluation of her neck pain and left arm pain.  Pain Assessment: Location: Neck pain that radiates into her left shoulder and left arm Radiating: Yes as noted above  Onset: Over 6 months ago Duration: Present throughout the day but worse with exertion and as the day progresses Quality: Sharp, shooting, aching, throbbing, tingling Severity: 5/10 (subjective, self-reported pain score)  Effect on ADL: Limits ability to perform overhead reach and some ADLs. Timing: Worse  in the evening and with exertion Modifying factors: Stretching, yoga Blood pressure: 134/96, heart rate 71, sats 98%  Paula Jensen presents today with a chief complaint of neck pain that radiates into primarily her left shoulder, left bicep in a dermatomal fashion.  This is been going on for over 6 months.  She has completed physical therapy for this with limited benefit.  She tries to do yoga exercises at home to help with range of motion and maintaining strength.  She has tried various medications including gabapentin, acetaminophen, NSAIDs including ibuprofen with limited analgesic benefit.  She has not responded to steroid therapy as well.  She has undergone trigger point injections with me with limited benefit.  She has had a cervical MRI performed on 04/20/2020, results of which are below.  She denies any significant upper extremity weakness or sensory disturbance of her upper extremity.  Meds   Current Outpatient Medications:  .  ALPRAZolam (XANAX) 0.25 MG tablet, TAKE 1 TABLET BY MOUTH AT BEDTIME AS NEEDED., Disp: 30 tablet, Rfl: 1 .  citalopram (CELEXA) 40 MG tablet, TAKE 1 TABLET BY MOUTH DAILY., Disp: 90 tablet, Rfl: 3 .  clindamycin (CLINDAGEL) 1 % gel, Apply topically 2 (two) times daily., Disp: 30 g, Rfl: 5 .  doxycycline (ADOXA) 50 MG tablet, Take 1 tablet (50 mg total) by mouth daily., Disp: 30 tablet, Rfl: 6 .  estradiol (ESTRACE) 0.1 MG/GM vaginal cream, Insert 1 gram intravaginally at bedtime daily for 2 weeks,  Then reduce use to twice weekly thereafter, Disp: 42.5 g, Rfl: 12 .  gabapentin (NEURONTIN) 100 MG capsule, Take 2 capsules (200 mg total) by mouth at bedtime., Disp: 180 capsule, Rfl: 0 .  Magnesium 500 MG CAPS, Take 2 capsules by mouth at bedtime., Disp: , Rfl:  .  predniSONE (DELTASONE) 10 MG tablet, 6 tablets daily for 3 days, then reduce by 1 tablet daily until gone, Disp: 33 tablet, Rfl: 0 .  tizanidine (ZANAFLEX) 2 MG capsule, Take 1 capsule (2 mg total) by mouth 3 (three)  times daily as needed for muscle spasms., Disp: 60 capsule, Rfl: 1 .  triamcinolone cream (KENALOG) 0.1 %, Apply 1 application topically 2 (two) times daily., Disp: 30 g, Rfl: 0 .  zolpidem (AMBIEN CR) 6.25 MG CR tablet, TAKE 1 TABLET BY MOUTH AT BEDTIME AS NEEDED FOR SLEEP, Disp: 30 tablet, Rfl: 3  Imaging Review  Cervical Imaging: Cervical MR wo contrast: Results for orders placed during the hospital encounter of 04/20/20  MR CERVICAL SPINE WO CONTRAST  Narrative CLINICAL DATA:  Neck pain, chronic.  EXAM: MRI CERVICAL SPINE WITHOUT CONTRAST  TECHNIQUE: Multiplanar, multisequence MR imaging of the cervical spine was performed. No intravenous contrast was administered.  COMPARISON:  Plain films October 08, 2019  FINDINGS: Alignment: Straightening of the cervical curvature. Small anterolisthesis of C3 over C4 and C7 over T1.  Vertebrae: No fracture, evidence of discitis, or bone lesion. Endplate degenerative changes at C4-5 and C5-6.  Cord: Normal signal and morphology.  Posterior Fossa, vertebral arteries, paraspinal tissues: Negative. An 8 mm right thyroid lobe nodule.  Disc levels:  C2-3: Facet degenerative changes resulting in mild left neural foraminal narrowing.  C3-4: Prominent facet degenerative changes and mild uncovertebral spurring without significant spinal canal or neural foraminal stenosis.  C4-5: Posterior disc osteophyte complex resulting in mild spinal canal stenosis. Uncovertebral and facet degenerative changes result severe right and moderate left neural foraminal narrowing.  C5-6: Posterior disc osteophyte complex resulting in mild spinal canal stenosis. Uncovertebral and facet degenerative changes resulting in mild bilateral neural foraminal narrowing.  C6-7: Posterior disc protrusion resulting mild spinal canal stenosis. Uncovertebral and facet degenerative changes resulting in severe bilateral neural foraminal narrowing.  C7-T1: Facet  degenerative changes resulting in mild left neural foraminal narrowing. No spinal canal stenosis.  IMPRESSION: 1. Multilevel cervical spondylosis as described above. There is mild spinal canal stenosis at C4-5, C5-6 and C6-7. 2. Bilateral neural foraminal narrowing is severe at C4-5 and C6-7.   Electronically Signed By: Pedro Earls M.D. On: 04/20/2020 16:18 DG Cervical Spine Complete  Narrative CLINICAL DATA:  LEFT side neck pain radiating to LEFT arm with head turning for 3-4 months, no known injury  EXAM: CERVICAL SPINE - COMPLETE 4+ VIEW  COMPARISON:  None  FINDINGS: Slight reversal of cervical lordosis question muscle spasm.  Prevertebral soft tissues normal thickness.  Disc space narrowing and endplate spur formation at C4-C5 through C6-C7.  Multilevel facet degenerative changes.  Minimal anterolisthesis C3-C4.  No fracture, additional subluxation, or bone destruction.  Uncovertebral spurs encroach upon cervical neural foramina bilaterally at C6-C7 and to lesser degree C5-C6.  Lung apices clear.  IMPRESSION: Multilevel degenerative disc and facet disease changes of the cervical spine with uncovertebral spurs encroaching upon cervical neural foramina bilaterally greatest at C6-C7.   Electronically Signed By: Lavonia Dana M.D. On: 10/09/2019 17:32  Foot-L DG Complete: Results for orders placed in visit on 04/07/17  DG Foot Complete Left  Narrative CLINICAL DATA:  Pain  EXAM: LEFT FOOT - COMPLETE 3+ VIEW  COMPARISON:  None.  FINDINGS: Frontal, oblique, and lateral views were obtained. No fracture or dislocation. There is moderate osteoarthritic change in the first MTP joint. Other joint spaces appear unremarkable. No erosive change. There is a small bone island  in the distal aspect of the second distal phalanx.  IMPRESSION: Moderate osteoarthritic change first MTP joint. Other joint spaces appear unremarkable. No fracture or  dislocation.   Electronically Signed By: Lowella Grip III M.D. On: 04/07/2017 12:58   Complexity Note: Imaging results reviewed. Results shared with Paula Jensen, using Layman's terms.                         ROS  Cardiovascular: No reported cardiovascular signs or symptoms such as High blood pressure, coronary artery disease, abnormal heart rate or rhythm, heart attack, blood thinner therapy or heart weakness and/or failure Pulmonary or Respiratory: No reported pulmonary signs or symptoms such as wheezing and difficulty taking a deep full breath (Asthma), difficulty blowing air out (Emphysema), coughing up mucus (Bronchitis), persistent dry cough, or temporary stoppage of breathing during sleep Neurological: No reported neurological signs or symptoms such as seizures, abnormal skin sensations, urinary and/or fecal incontinence, being born with an abnormal open spine and/or a tethered spinal cord Psychological-Psychiatric: No reported psychological or psychiatric signs or symptoms such as difficulty sleeping, anxiety, depression, delusions or hallucinations (schizophrenial), mood swings (bipolar disorders) or suicidal ideations or attempts Gastrointestinal: Reflux or heatburn Genitourinary: No reported renal or genitourinary signs or symptoms such as difficulty voiding or producing urine, peeing blood, non-functioning kidney, kidney stones, difficulty emptying the bladder, difficulty controlling the flow of urine, or chronic kidney disease Hematological: No reported hematological signs or symptoms such as prolonged bleeding, low or poor functioning platelets, bruising or bleeding easily, hereditary bleeding problems, low energy levels due to low hemoglobin or being anemic Endocrine: No reported endocrine signs or symptoms such as high or low blood sugar, rapid heart rate due to high thyroid levels, obesity or weight gain due to slow thyroid or thyroid disease Rheumatologic: No reported  rheumatological signs and symptoms such as fatigue, joint pain, tenderness, swelling, redness, heat, stiffness, decreased range of motion, with or without associated rash Musculoskeletal: Neck pain Work History: Working full time  Allergies  Paula Jensen has No Known Allergies.  Laboratory Chemistry Profile   Renal Lab Results  Component Value Date   BUN 24 (H) 07/19/2019   CREATININE 0.73 07/19/2019   BCR 22 10/22/2018   GFR 82.73 07/19/2019   GFRAA 92 10/22/2018   GFRNONAA 80 10/22/2018     Electrolytes Lab Results  Component Value Date   NA 139 07/19/2019   K 4.0 07/19/2019   CL 103 07/19/2019   CALCIUM 9.0 07/19/2019     Hepatic Lab Results  Component Value Date   AST 14 09/17/2019   ALT 14 09/17/2019   ALBUMIN 4.3 09/17/2019   ALKPHOS 113 09/17/2019     ID Lab Results  Component Value Date   HIV NON-REACTIVE 04/07/2017   SARSCOV2NAA NEGATIVE 08/21/2019   HCVAB NEGATIVE 03/19/2014     Bone Lab Results  Component Value Date   VD25OH 52.24 04/07/2017     Endocrine Lab Results  Component Value Date   GLUCOSE 103 (H) 07/19/2019   TSH 0.92 07/19/2019     Neuropathy Lab Results  Component Value Date   VITAMINB12 367 04/07/2017   HIV NON-REACTIVE 04/07/2017     CNS No results found for: COLORCSF, APPEARCSF, RBCCOUNTCSF, WBCCSF, POLYSCSF, LYMPHSCSF, EOSCSF, PROTEINCSF, GLUCCSF, JCVIRUS, CSFOLI, IGGCSF, LABACHR, ACETBL, LABACHR, ACETBL   Inflammation (CRP: Acute  ESR: Chronic) No results found for: CRP, ESRSEDRATE, LATICACIDVEN   Rheumatology No results found for: RF, ANA, LABURIC, URICUR, LYMEIGGIGMAB,  LYMEABIGMQN, HLAB27   Coagulation Lab Results  Component Value Date   INR 1.0 10/22/2018   INR 1.0 10/22/2018   LABPROT 10.3 10/22/2018   PLT 209 10/22/2018     Cardiovascular Lab Results  Component Value Date   HGB 14.1 10/22/2018   HCT 42.7 10/22/2018     Screening Lab Results  Component Value Date   SARSCOV2NAA NEGATIVE 08/21/2019    HCVAB NEGATIVE 03/19/2014   HIV NON-REACTIVE 04/07/2017     Cancer No results found for: CEA, CA125, LABCA2   Allergens No results found for: ALMOND, APPLE, ASPARAGUS, AVOCADO, BANANA, BARLEY, BASIL, BAYLEAF, GREENBEAN, LIMABEAN, WHITEBEAN, BEEFIGE, REDBEET, BLUEBERRY, BROCCOLI, CABBAGE, MELON, CARROT, CASEIN, CASHEWNUT, CAULIFLOWER, CELERY     Note: Lab results reviewed.  [place]  Drug: Paula Jensen  reports no history of drug use. Alcohol:  reports current alcohol use of about 10.0 standard drinks of alcohol per week. Tobacco:  reports that she has been smoking. She has never used smokeless tobacco. Medical:  has a past medical history of allergic rhinitis, Cancer ([place]), Family history of adverse reaction to anesthesia, GERD (gastroesophageal reflux disease), and Wears contact lenses. Family: family history includes Atrial fibrillation in her father; Hypertension in her mother.  Past Surgical History:  Procedure Laterality Date  . COLONOSCOPY WITH PROPOFOL N/A 08/23/2019   Procedure: COLONOSCOPY WITH PROPOFOL;  Surgeon: [name], MD;  Location: [place];  Service: Endoscopy;  Laterality: N/A;  priority 4  . DILATION AND CURETTAGE OF UTERUS  June 2012   [place] , for heavy bleeding   . laparoscopy  1996  . TONSILLECTOMY AND ADENOIDECTOMY     Active Ambulatory Problems    Diagnosis Date Noted  . Allergic rhinitis, seasonal   . GERD (gastroesophageal reflux disease)   . Overweight (BMI 25.0-29.9) 10/10/2011  . Encounter for preventive health examination 02/17/2012  . Alkaline phosphatase elevation 03/21/2014  . Insomnia 04/05/2015  . Anxiety, generalized 12/02/2015  . IBS (irritable bowel syndrome) 04/05/2016  . Foot pain, left 04/08/2017  . Perimenopause 09/12/2017  . Marital conflict 33/00/7622  . Menopausal state 04/15/2018  . Rectal bleeding 10/10/2018  . Synovial cyst of hand 02/18/2019  . Endometriosis determined by laparoscopy 07/19/2019  . Atrophic  vaginitis 07/23/2019  . Digital mucous cyst 02/22/2019  . Heberden node 02/22/2019  . Encounter for screening colonoscopy   . Cervical spondylosis 10/10/2019  . Nonallopathic lesion of cervical region 02/03/2020  . Neuroforaminal stenosis of cervical spine 04/28/2020  . Cervical radicular pain (left C4,5,6) 04/28/2020   Resolved Ambulatory Problems    Diagnosis Date Noted  . External hemorrhoid, bleeding 10/10/2011  . Contraceptive management 03/19/2014  . Viral URI with cough 04/05/2016   Past Medical History:  Diagnosis Date  . allergic rhinitis   . Cancer ([place])   . Family history of adverse reaction to anesthesia   . Wears contact lenses    Constitutional Exam  General appearance: Well nourished, well developed, and well hydrated. In no apparent acute distress There were no vitals filed for this visit. BMI Assessment: Estimated body mass index is 24.94 kg/m as calculated from the following:   Height as of 03/27/20: '5\' 8"'  (1.727 m).   Weight as of 03/27/20: 164 lb (74.4 kg).  BMI interpretation table: BMI level Category Range association with higher incidence of chronic pain  <18 kg/m2 Underweight   18.5-24.9 kg/m2 Ideal body weight   25-29.9 kg/m2 Overweight Increased incidence by 20%  30-34.9 kg/m2 Obese (Class I) Increased incidence by  68%  35-39.9 kg/m2 Severe obesity (Class II) Increased incidence by 136%  >40 kg/m2 Extreme obesity (Class III) Increased incidence by 254%   Patient's current BMI Ideal Body weight  There is no height or weight on file to calculate BMI. Patient weight not recorded   BMI Readings from Last 4 Encounters:  03/27/20 24.94 kg/m  02/03/20 25.70 kg/m  01/17/20 25.24 kg/m  08/23/19 27.06 kg/m   Wt Readings from Last 4 Encounters:  03/27/20 164 lb (74.4 kg)  02/03/20 169 lb (76.7 kg)  01/17/20 166 lb (75.3 kg)  08/23/19 178 lb (80.7 kg)    Psych/Mental status: Alert, oriented x 3 (person, place, & time)       Eyes:  PERLA Respiratory: No evidence of acute respiratory distress  Cervical Spine Exam  Skin & Axial Inspection: No masses, redness, edema, swelling, or associated skin lesions Alignment: Symmetrical Functional ROM: Pain restricted ROM, to the left Stability: No instability detected Muscle Tone/Strength: Functionally intact. No obvious neuro-muscular anomalies detected. Sensory (Neurological): Dermatomal pain pattern left C4,5,6 Palpation: Complains of area being tender to palpation             Positive Spurling's on the left side Upper Extremity (UE) Exam    Side: Right upper extremity  Side: Left upper extremity  Skin & Extremity Inspection: Skin color, temperature, and hair growth are WNL. No peripheral edema or cyanosis. No masses, redness, swelling, asymmetry, or associated skin lesions. No contractures.  Skin & Extremity Inspection: Skin color, temperature, and hair growth are WNL. No peripheral edema or cyanosis. No masses, redness, swelling, asymmetry, or associated skin lesions. No contractures.  Functional ROM: Unrestricted ROM          Functional ROM: Pain restricted ROM for shoulder  Muscle Tone/Strength: Functionally intact. No obvious neuro-muscular anomalies detected.   Muscle Tone/Strength: Functionally intact. No obvious neuro-muscular anomalies detected.  Sensory (Neurological): Unimpaired          Sensory (Neurological): Dermatomal pain pattern          Palpation: No palpable anomalies              Palpation: No palpable anomalies              Provocative Test(s):  Phalen's test: deferred Tinel's test: deferred Apley's scratch test (touch opposite shoulder):  Action 1 (Across chest): deferred Action 2 (Overhead): deferred Action 3 (LB reach): deferred   Provocative Test(s):  Phalen's test: deferred Tinel's test: deferred Apley's scratch test (touch opposite shoulder):  Action 1 (Across chest): Decreased ROM Action 2 (Overhead): Decreased ROM Action 3 (LB reach):  Decreased ROM    Lumbar Spine Area Exam  Skin & Axial Inspection: No masses, redness, or swelling Alignment: Symmetrical Functional ROM: Unrestricted ROM       Stability: No instability detected Muscle Tone/Strength: Functionally intact. No obvious neuro-muscular anomalies detected. Sensory (Neurological): Unimpaired   Assessment  Primary Diagnosis & Pertinent Problem List: The primary encounter diagnosis was Cervical radicular pain (left C4,5,6). Diagnoses of Neuroforaminal stenosis of cervical spine (C4/5, C6/7), Spinal stenosis in cervical region, and Cervical spondylosis were also pertinent to this visit.  Visit Diagnosis (New problems to examiner): 1. Cervical radicular pain (left C4,5,6)   2. Neuroforaminal stenosis of cervical spine (C4/5, C6/7)   3. Spinal stenosis in cervical region   4. Cervical spondylosis    Plan of Care (Initial workup plan)   1. Cervical radicular pain (left C4,5,6) -C-MRI:  C2-3: Facet degenerative changes resulting in mild left  neural foraminal narrowing.C3-4: Prominent facet degenerative changes and mild uncovertebral spurring without significant spinal canal or neural foraminal stenosis.  C4-5: Posterior disc osteophyte complex resulting in mild spinal canal stenosis. Uncovertebral and facet degenerative changes result severe right and moderate left neural foraminal narrowing.  C5-6: Posterior disc osteophyte complex resulting in mild spinal canal stenosis. Uncovertebral and facet degenerative changes resulting in mild bilateral neural foraminal narrowing.  C6-7: Posterior disc protrusion resulting mild spinal canal stenosis. Uncovertebral and facet degenerative changes resulting in severe bilateral neural foraminal narrowing.  C7-T1: Facet degenerative changes resulting in mild left neural foraminal narrowing. No spinal canal stenosis.  IMPRESSION: 1. Multilevel cervical spondylosis as described above. There is mild spinal canal stenosis  at C4-5, C5-6 and C6-7. 2. Bilateral neural foraminal narrowing is severe at C4-5 and C6-7  -Paula Jensen symptoms are consistent with left cervical radiculopathy as evidenced by physical exam findings and cervical MRI.  Unfortunately she has not responded to conservative therapy including physical therapy, multimodal medication management, trigger point injections.  Recommend left diagnostic cervical epidural steroid injection for her condition.  Risks and benefits reviewed patient would like to proceed. - Cervical Epidural Injection; Future  2. Neuroforaminal stenosis of cervical spine (C4/5, C6/7) - Cervical Epidural Injection; Future   Pharmacological management options:  Opioid Analgesics: Avoid for time being  Membrane stabilizer: Adequate regimen continue gabapentin 200 mg as needed  Muscle relaxant: Adequate regimen tizanidine as needed  NSAID: To be determined at a later time  Other analgesic(s): To be determined at a later time   Interventional management options: Paula Jensen was informed that there is no guarantee that she would be a candidate for interventional therapies. The decision will be based on the results of diagnostic studies, as well as Paula Jensen's risk profile.  Procedure(s) under consideration:  Cervical ESI   Provider-requested follow-up: No follow-ups on file.  Future Appointments  Date Time Provider [place]  04/28/2020  4:20 PM Gillis Santa, MD [facility] None  07/31/2020  8:30 AM Crecencio Mc, MD [id number]    Note by: Gillis Santa, MD Date: 04/28/2020; Time: 2:12 PM

## 2020-04-30 ENCOUNTER — Ambulatory Visit (HOSPITAL_BASED_OUTPATIENT_CLINIC_OR_DEPARTMENT_OTHER)
Payer: No Typology Code available for payment source | Admitting: Student in an Organized Health Care Education/Training Program

## 2020-04-30 ENCOUNTER — Encounter: Payer: Self-pay | Admitting: Student in an Organized Health Care Education/Training Program

## 2020-04-30 ENCOUNTER — Ambulatory Visit
Admission: RE | Admit: 2020-04-30 | Discharge: 2020-04-30 | Disposition: A | Discharge status: Home / Self Care | Payer: No Typology Code available for payment source | Source: Ambulatory Visit | Attending: Student in an Organized Health Care Education/Training Program | Admitting: Student in an Organized Health Care Education/Training Program

## 2020-04-30 VITALS — BP 154/99 | HR 78 | Temp 96.9°F | Resp 16 | Ht 67.0 in | Wt 161.0 lb

## 2020-04-30 DIAGNOSIS — M5412 Radiculopathy, cervical region: Secondary | ICD-10-CM

## 2020-04-30 DIAGNOSIS — M4802 Spinal stenosis, cervical region: Secondary | ICD-10-CM | POA: Diagnosis not present

## 2020-04-30 MED ORDER — ROPIVACAINE HCL 2 MG/ML IJ SOLN
1.0000 mL | Freq: Once | INTRAMUSCULAR | Status: AC
  Administered 2020-04-30: 1 mL via EPIDURAL

## 2020-04-30 MED ORDER — LIDOCAINE HCL 2 % IJ SOLN
20.0000 mL | Freq: Once | INTRAMUSCULAR | Status: AC
  Administered 2020-04-30: 100 mg
  Filled 2020-04-30: qty 20

## 2020-04-30 MED ORDER — DEXAMETHASONE SODIUM PHOSPHATE 10 MG/ML IJ SOLN
10.0000 mg | Freq: Once | INTRAMUSCULAR | Status: AC
  Administered 2020-04-30: 10 mg

## 2020-04-30 MED ORDER — SODIUM CHLORIDE 0.9% FLUSH
1.0000 mL | Freq: Once | INTRAVENOUS | Status: AC
  Administered 2020-04-30: 1 mL

## 2020-04-30 MED ORDER — IOHEXOL 180 MG/ML  SOLN
10.0000 mL | Freq: Once | INTRAMUSCULAR | Status: AC
  Administered 2020-04-30: 10 mL via EPIDURAL

## 2020-04-30 NOTE — Patient Instructions (Signed)
Pain Management Discharge Instructions  General Discharge Instructions :  If you need to reach your doctor call: Monday-Friday 8:00 am - 4:00 pm at [phone] or toll free [phone].  After clinic hours [phone] to have operator reach doctor.  Bring all of your medication bottles to all your appointments in the pain clinic.  To cancel or reschedule your appointment with Pain Management please remember to call 24 hours in advance to avoid a fee.  Refer to the educational materials which you have been given on: General Risks, I had my Procedure. Discharge Instructions, Post Sedation.  Post Procedure Instructions:  The drugs you were given will stay in your system until tomorrow, so for the next 24 hours you should not drive, make any legal decisions or drink any alcoholic beverages.  You may eat anything you prefer, but it is better to start with liquids then soups and crackers, and gradually work up to solid foods.  Please notify your doctor immediately if you have any unusual bleeding, trouble breathing or pain that is not related to your normal pain.  Depending on the type of procedure that was done, some parts of your body may feel week and/or numb.  This usually clears up by tonight or the next day.  Walk with the use of an assistive device or accompanied by an adult for the 24 hours.  You may use ice on the affected area for the first 24 hours.  Put ice in a Ziploc bag and cover with a towel and place against area 15 minutes on 15 minutes off.  You may switch to heat after 24 hours.Epidural Steroid Injection Patient Information  Description: The epidural space surrounds the nerves as they exit the spinal cord.  In some patients, the nerves can be compressed and inflamed by a bulging disc or a tight spinal canal (spinal stenosis).  By injecting steroids into the epidural space, we can bring irritated nerves into direct contact with a potentially helpful medication.  These  steroids act directly on the irritated nerves and can reduce swelling and inflammation which often leads to decreased pain.  Epidural steroids may be injected anywhere along the spine and from the neck to the low back depending upon the location of your pain.   After numbing the skin with local anesthetic (like Novocaine), a small needle is passed into the epidural space slowly.  You may experience a sensation of pressure while this is being done.  The entire block usually last less than 10 minutes.  Conditions which may be treated by epidural steroids:   Low back and leg pain  Neck and arm pain  Spinal stenosis  Post-laminectomy syndrome  Herpes zoster (shingles) pain  Pain from compression fractures  Preparation for the injection:  1. Do not eat any solid food or dairy products within 8 hours of your appointment.  2. You may drink clear liquids up to 3 hours before appointment.  Clear liquids include water, black coffee, juice or soda.  No milk or cream please. 3. You may take your regular medication, including pain medications, with a sip of water before your appointment  Diabetics should hold regular insulin (if taken separately) and take 1/2 normal NPH dos the morning of the procedure.  Carry some sugar containing items with you to your appointment. 4. A driver must accompany you and be prepared to drive you home after your procedure.  5. Bring all your current medications with your. 6. An IV may be inserted and   sedation may be given at the discretion of the physician.   7. A blood pressure cuff, EKG and other monitors will often be applied during the procedure.  Some patients may need to have extra oxygen administered for a short period. 8. You will be asked to provide medical information, including your allergies, prior to the procedure.  We must know immediately if you are taking blood thinners (like Coumadin/Warfarin)  Or if you are allergic to IV iodine contrast (dye). We must  know if you could possible be pregnant.  Possible side-effects:  Bleeding from needle site  Infection (rare, may require surgery)  Nerve injury (rare)  Numbness & tingling (temporary)  Difficulty urinating (rare, temporary)  Spinal headache ( a headache worse with upright posture)  Light -headedness (temporary)  Pain at injection site (several days)  Decreased blood pressure (temporary)  Weakness in arm/leg (temporary)  Pressure sensation in back/neck (temporary)  Call if you experience:  Fever/chills associated with headache or increased back/neck pain.  Headache worsened by an upright position.  New onset weakness or numbness of an extremity below the injection site  Hives or difficulty breathing (go to the emergency room)  Inflammation or drainage at the infection site  Severe back/neck pain  Any new symptoms which are concerning to you  Please note:  Although the local anesthetic injected can often make your back or neck feel good for several hours after the injection, the pain will likely return.  It takes 3-7 days for steroids to work in the epidural space.  You may not notice any pain relief for at least that one week.  If effective, we will often do a series of three injections spaced 3-6 weeks apart to maximally decrease your pain.  After the initial series, we generally will wait several months before considering a repeat injection of the same type.  If you have any questions, please call [phone]  [facility] 

## 2020-04-30 NOTE — Progress Notes (Signed)
PROVIDER NOTE: Information contained herein reflects review and annotations entered in association with encounter. Interpretation of such information and data should be left to medically-trained personnel. Information provided to patient can be located elsewhere in the medical record under "Patient Instructions". Document created using STT-dictation technology, any transcriptional errors that may result from process are unintentional.    Patient: Paula Jensen  Service Category: Procedure  Provider: Gillis Santa, MD  DOB: 05/14/1965  DOS: 04/30/2020  Location: [facility]  MRN: [id number]  Setting: Ambulatory - outpatient  Referring Provider: Crecencio Mc, MD  Type: Established Patient  Specialty: Interventional Pain Management  PCP: Crecencio Mc, MD   Primary Reason for Visit: Interventional Pain Management Treatment. CC: Neck Pain (left)  Procedure:          Anesthesia, Analgesia, Anxiolysis:  Type: Diagnostic, Inter-Laminar, Cervical Epidural Steroid Injection  #1  Region: Posterior Cervico-thoracic Region Level: C7-T1 Laterality: Left-Sided Paramedial  Type: Local Anesthesia  Local Anesthetic: Lidocaine 1-2%  Position: Prone with head of the table was raised to facilitate breathing.   Indications: 1. Cervical radicular pain (left C4,5,6)   2. Neuroforaminal stenosis of cervical spine (C4/5, C6/7)    Pain Score: Pre-procedure: 3 /10 Post-procedure: 3 /10   Pre-op H&P Assessment:  Paula Jensen is a 55 y.o. (year old), female patient, seen today for interventional treatment. She  has a past surgical history that includes Dilation and curettage of uterus (June 2012); Tonsillectomy and adenoidectomy; laparoscopy (1996); and Colonoscopy with propofol (N/A, 08/23/2019). Paula Jensen has a current medication list which includes the following prescription(s): alprazolam, citalopram, clindamycin, estradiol, gabapentin, magnesium, triamcinolone, zolpidem, doxycycline,  prednisone, tizanidine, and [DISCONTINUED] dicyclomine. Her primarily concern today is the Neck Pain (left)  Initial Vital Signs:  Pulse/HCG Rate: 78ECG Heart Rate: 72 Temp: (!) 96.9 F (36.1 C) Resp: 16 BP: (!) 147/99 SpO2: 98 %  BMI: Estimated body mass index is 25.22 kg/m as calculated from the following:   Height as of this encounter: 5\' 7"  (1.702 m).   Weight as of this encounter: 161 lb (73 kg).  Risk Assessment: Allergies: Reviewed. She has No Known Allergies.  Allergy Precautions: None required Coagulopathies: Reviewed. None identified.  Blood-thinner therapy: None at this time Active Infection(s): Reviewed. None identified. Paula Jensen is afebrile  Site Confirmation: Paula Jensen was asked to confirm the procedure and laterality before marking the site Procedure checklist: Completed Consent: Before the procedure and under the influence of no sedative(s), amnesic(s), or anxiolytics, the patient was informed of the treatment options, risks and possible complications. To fulfill our ethical and legal obligations, as recommended by the American Medical Association's Code of Ethics, I have informed the patient of my clinical impression; the nature and purpose of the treatment or procedure; the risks, benefits, and possible complications of the intervention; the alternatives, including doing nothing; the risk(s) and benefit(s) of the alternative treatment(s) or procedure(s); and the risk(s) and benefit(s) of doing nothing. The patient was provided information about the general risks and possible complications associated with the procedure. These may include, but are not limited to: failure to achieve desired goals, infection, bleeding, organ or nerve damage, allergic reactions, paralysis, and death. In addition, the patient was informed of those risks and complications associated to Spine-related procedures, such as failure to decrease pain; infection (i.e.: Meningitis, epidural or  intraspinal abscess); bleeding (i.e.: epidural hematoma, subarachnoid hemorrhage, or any other type of intraspinal or peri-dural bleeding); organ or nerve damage (i.e.: Any type of peripheral  nerve, nerve root, or spinal cord injury) with subsequent damage to sensory, motor, and/or autonomic systems, resulting in permanent pain, numbness, and/or weakness of one or several areas of the body; allergic reactions; (i.e.: anaphylactic reaction); and/or death. Furthermore, the patient was informed of those risks and complications associated with the medications. These include, but are not limited to: allergic reactions (i.e.: anaphylactic or anaphylactoid reaction(s)); adrenal axis suppression; blood sugar elevation that in diabetics may result in ketoacidosis or comma; water retention that in patients with history of congestive heart failure may result in shortness of breath, pulmonary edema, and decompensation with resultant heart failure; weight gain; swelling or edema; medication-induced neural toxicity; particulate matter embolism and blood vessel occlusion with resultant organ, and/or nervous system infarction; and/or aseptic necrosis of one or more joints. Finally, the patient was informed that Medicine is not an exact science; therefore, there is also the possibility of unforeseen or unpredictable risks and/or possible complications that may result in a catastrophic outcome. The patient indicated having understood very clearly. We have given the patient no guarantees and we have made no promises. Enough time was given to the patient to ask questions, all of which were answered to the patient's satisfaction. Paula Jensen has indicated that she wanted to continue with the procedure. Attestation: I, the ordering provider, attest that I have discussed with the patient the benefits, risks, side-effects, alternatives, likelihood of achieving goals, and potential problems during recovery for the procedure that I have  provided informed consent. Date  Time: 04/30/2020  1:56 PM  Pre-Procedure Preparation:  Monitoring: As per clinic protocol. Respiration, ETCO2, SpO2, BP, heart rate and rhythm monitor placed and checked for adequate function Safety Precautions: Patient was assessed for positional comfort and pressure points before starting the procedure. Time-out: I initiated and conducted the "Time-out" before starting the procedure, as per protocol. The patient was asked to participate by confirming the accuracy of the "Time Out" information. Verification of the correct person, site, and procedure were performed and confirmed by me, the nursing staff, and the patient. "Time-out" conducted as per Joint Commission's Universal Protocol (UP.01.01.01). Time: 1415  Description of Procedure:          Target Area: For Epidural Steroid injections the target is the interlaminar space, initially targeting the lower border of the superior vertebral body lamina. Approach: Paramedial approach. Area Prepped: Entire PosteriorCervical Region DuraPrep (Iodine Povacrylex [0.7% available iodine] and Isopropyl Alcohol, 74% w/w) Safety Precautions: Aspiration looking for blood return was conducted prior to all injections. At no point did we inject any substances, as a needle was being advanced. No attempts were made at seeking any paresthesias. Safe injection practices and needle disposal techniques used. Medications properly checked for expiration dates. SDV (single dose vial) medications used. Description of the Procedure: Protocol guidelines were followed. The procedure needle was introduced through the skin, ipsilateral to the reported pain, and advanced to the target area. Bone was contacted and the needle walked caudad, until the lamina was cleared. The epidural space was identified using "loss-of-resistance technique" with 2-3 ml of PF-NaCl (0.9% NSS), in a 5cc LOR glass syringe. Vitals:   04/30/20 1358 04/30/20 1409 04/30/20  1415 04/30/20 1425  BP: (!) 147/99 (!) 153/113 (!) 154/101 (!) 154/99  Pulse: 78     Resp: 16 16 16 16   Temp: (!) 96.9 F (36.1 C)     TempSrc: Temporal     SpO2: 98% 98% 97% 98%  Weight: 161 lb (73 kg)  Height: 5\' 7"  (1.702 m)       Start Time: 1415 hrs. End Time: 1420 hrs. Materials:  Needle(s) Type: Epidural needle Gauge: 22G Length: 3.5-in Medication(s): Please see orders for medications and dosing details. 3 cc solution made of 1 cc of preservative-free saline, 1 cc of 0.2% ropivacaine, 1 cc of Decadron 10 mg/cc.  Imaging Guidance (Spinal):          Type of Imaging Technique: Fluoroscopy Guidance (Spinal) Indication(s): Assistance in needle guidance and placement for procedures requiring needle placement in or near specific anatomical locations not easily accessible without such assistance. Exposure Time: Please see nurses notes. Contrast: Before injecting any contrast, we confirmed that the patient did not have an allergy to iodine, shellfish, or radiological contrast. Once satisfactory needle placement was completed at the desired level, radiological contrast was injected. Contrast injected under live fluoroscopy. No contrast complications. See chart for type and volume of contrast used. Fluoroscopic Guidance: I was personally present during the use of fluoroscopy. "Tunnel Vision Technique" used to obtain the best possible view of the target area. Parallax error corrected before commencing the procedure. "Direction-depth-direction" technique used to introduce the needle under continuous pulsed fluoroscopy. Once target was reached, antero-posterior, oblique, and lateral fluoroscopic projection used confirm needle placement in all planes. Images permanently stored in EMR. Interpretation: I personally interpreted the imaging intraoperatively. Adequate needle placement confirmed in multiple planes. Appropriate spread of contrast into desired area was observed. No evidence of afferent  or efferent intravascular uptake. No intrathecal or subarachnoid spread observed. Permanent images saved into the patient's record.   Post-operative Assessment:  Post-procedure Vital Signs:  Pulse/HCG Rate: 7880 Temp: (!) 96.9 F (36.1 C) Resp: 16 BP: (!) 154/99 SpO2: 98 %  EBL: None  Complications: No immediate post-treatment complications observed by team, or reported by patient.  Note: The patient tolerated the entire procedure well. A repeat set of vitals were taken after the procedure and the patient was kept under observation following institutional policy, for this type of procedure. Post-procedural neurological assessment was performed, showing return to baseline, prior to discharge. The patient was provided with post-procedure discharge instructions, including a section on how to identify potential problems. Should any problems arise concerning this procedure, the patient was given instructions to immediately contact us, at any time, without hesitation. In any case, we plan to contact the patient by telephone for a follow-up status report regarding this interventional procedure.  Comments:  No additional relevant information.  5 out of 5 strength bilateral upper extremity: Shoulder abduction, elbow flexion, elbow extension, thumb extension.   Plan of Care  Orders:  Orders Placed This Encounter  Procedures  . DG PAIN CLINIC C-ARM 1-60 MIN NO REPORT    Intraoperative interpretation by procedural physician at [facility].    Standing Status:   Standing    Number of Occurrences:   1    Order Specific Question:   Reason for exam:    Answer:   Assistance in needle guidance and placement for procedures requiring needle placement in or near specific anatomical locations not easily accessible without such assistance.    Medications ordered for procedure: Meds ordered this encounter  Medications  . iohexol (OMNIPAQUE) 180 MG/ML injection 10 mL    Must be  Myelogram-compatible. If not available, you may substitute with a water-soluble, non-ionic, hypoallergenic, myelogram-compatible radiological contrast medium.  Marland Kitchen lidocaine (XYLOCAINE) 2 % (with pres) injection 400 mg  . ropivacaine (PF) 2 mg/mL (0.2%) (NAROPIN) injection 1 mL  .  sodium chloride flush (NS) 0.9 % injection 1 mL  . dexamethasone (DECADRON) injection 10 mg   Medications administered: We administered iohexol, lidocaine, ropivacaine (PF) 2 mg/mL (0.2%), sodium chloride flush, and dexamethasone.  See the medical record for exact dosing, route, and time of administration.  Follow-up plan:   Return if symptoms worsen or fail to improve.      Status post left C7-T1 ESI 04/30/2020   Recent Visits Date Type Provider Dept  04/28/20 Office Visit Gillis Santa, MD [facility]  Showing recent visits within past 90 days and meeting all other requirements Today's Visits Date Type Provider Dept  04/30/20 Procedure visit Gillis Santa, MD [facility]  Showing today's visits and meeting all other requirements Future Appointments No visits were found meeting these conditions. Showing future appointments within next 90 days and meeting all other requirements  Disposition: Discharge home  Discharge (Date  Time): 04/30/2020; 1430 hrs.   Primary Care Physician: Crecencio Mc, MD Location: [facility] Note by: Gillis Santa, MD Date: 04/30/2020; Time: 2:32 PM  Disclaimer:  Medicine is not an exact science. The only guarantee in medicine is that nothing is guaranteed. It is important to note that the decision to proceed with this intervention was based on the information collected from the patient. The Data and conclusions were drawn from the patient's questionnaire, the interview, and the physical examination. Because the information was provided in large part by the patient, it cannot be guaranteed that it has not been purposely or  unconsciously manipulated. Every effort has been made to obtain as much relevant data as possible for this evaluation. It is important to note that the conclusions that lead to this procedure are derived in large part from the available data. Always take into account that the treatment will also be dependent on availability of resources and existing treatment guidelines, considered by other Pain Management Practitioners as being common knowledge and practice, at the time of the intervention. For Medico-Legal purposes, it is also important to point out that variation in procedural techniques and pharmacological choices are the acceptable norm. The indications, contraindications, technique, and results of the above procedure should only be interpreted and judged by a Board-Certified Interventional Pain Specialist with extensive familiarity and expertise in the same exact procedure and technique.

## 2020-05-26 ENCOUNTER — Other Ambulatory Visit: Payer: Self-pay | Admitting: Internal Medicine

## 2020-05-26 DIAGNOSIS — M47812 Spondylosis without myelopathy or radiculopathy, cervical region: Secondary | ICD-10-CM

## 2020-05-26 DIAGNOSIS — M5412 Radiculopathy, cervical region: Secondary | ICD-10-CM

## 2020-07-05 MED FILL — Zolpidem Tartrate Tab ER 6.25 MG: ORAL | 30 days supply | Qty: 30 | Fill #0 | Status: AC

## 2020-07-06 ENCOUNTER — Other Ambulatory Visit: Payer: Self-pay

## 2020-07-30 ENCOUNTER — Other Ambulatory Visit: Payer: Self-pay | Admitting: Internal Medicine

## 2020-07-31 ENCOUNTER — Other Ambulatory Visit: Payer: Self-pay

## 2020-07-31 ENCOUNTER — Encounter: Payer: No Typology Code available for payment source | Admitting: Internal Medicine

## 2020-07-31 ENCOUNTER — Other Ambulatory Visit: Payer: Self-pay | Admitting: Internal Medicine

## 2020-07-31 MED FILL — Zolpidem Tartrate Tab ER 6.25 MG: ORAL | 30 days supply | Qty: 30 | Fill #0 | Status: CN

## 2020-07-31 MED FILL — Zolpidem Tartrate Tab ER 6.25 MG: ORAL | 30 days supply | Qty: 30 | Fill #0 | Status: AC

## 2020-07-31 NOTE — Telephone Encounter (Signed)
Based on her refill history she should still have some of this left. Did she end up taking more than prescribed at some point in time? Her last refill of this was filled on 07/06/20 and should have lasted for 30 days.

## 2020-07-31 NOTE — Telephone Encounter (Signed)
Patient has enough to last until Monday has an appointment for follow up scheduled for 08/03/20

## 2020-07-31 NOTE — Telephone Encounter (Signed)
Has an Upcoming Appointment on 08/05/20 last appointment 10/21 last fill 04/09/20 30 with 3 refills none remaining ok to fill?

## 2020-08-03 ENCOUNTER — Other Ambulatory Visit: Payer: Self-pay

## 2020-08-05 ENCOUNTER — Encounter: Payer: Self-pay | Admitting: Internal Medicine

## 2020-08-05 ENCOUNTER — Ambulatory Visit (INDEPENDENT_AMBULATORY_CARE_PROVIDER_SITE_OTHER): Payer: No Typology Code available for payment source | Admitting: Internal Medicine

## 2020-08-05 ENCOUNTER — Other Ambulatory Visit (HOSPITAL_COMMUNITY)
Admission: RE | Admit: 2020-08-05 | Discharge: 2020-08-05 | Disposition: A | Discharge status: Home / Self Care | Payer: No Typology Code available for payment source | Source: Ambulatory Visit | Attending: Internal Medicine | Admitting: Internal Medicine

## 2020-08-05 ENCOUNTER — Other Ambulatory Visit: Payer: Self-pay

## 2020-08-05 VITALS — BP 102/68 | HR 75 | Temp 98.0°F | Ht 67.0 in | Wt 169.0 lb

## 2020-08-05 DIAGNOSIS — Z124 Encounter for screening for malignant neoplasm of cervix: Secondary | ICD-10-CM | POA: Diagnosis not present

## 2020-08-05 DIAGNOSIS — Z1231 Encounter for screening mammogram for malignant neoplasm of breast: Secondary | ICD-10-CM

## 2020-08-05 DIAGNOSIS — G4700 Insomnia, unspecified: Secondary | ICD-10-CM | POA: Insufficient documentation

## 2020-08-05 DIAGNOSIS — Z0001 Encounter for general adult medical examination with abnormal findings: Secondary | ICD-10-CM | POA: Diagnosis not present

## 2020-08-05 DIAGNOSIS — A630 Anogenital (venereal) warts: Secondary | ICD-10-CM

## 2020-08-05 DIAGNOSIS — F10982 Alcohol use, unspecified with alcohol-induced sleep disorder: Secondary | ICD-10-CM

## 2020-08-05 MED ORDER — ALPRAZOLAM 0.25 MG PO TABS
ORAL_TABLET | Freq: Every evening | ORAL | 1 refills | Status: DC | PRN
  Filled 2020-08-05: qty 30, 30d supply, fill #0
  Filled 2020-09-22: qty 30, 30d supply, fill #1

## 2020-08-05 MED ORDER — CITALOPRAM HYDROBROMIDE 40 MG PO TABS
ORAL_TABLET | Freq: Every day | ORAL | 3 refills | Status: DC
  Filled 2020-08-05 – 2020-09-22 (×2): qty 90, 90d supply, fill #0
  Filled 2020-12-23: qty 90, 90d supply, fill #1
  Filled 2021-03-25: qty 90, 90d supply, fill #2
  Filled 2021-06-23: qty 90, 90d supply, fill #3

## 2020-08-05 MED ORDER — ZOLPIDEM TARTRATE ER 6.25 MG PO TBCR
EXTENDED_RELEASE_TABLET | Freq: Every evening | ORAL | 5 refills | Status: DC | PRN
  Filled 2020-08-05: qty 30, fill #0
  Filled 2020-09-02: qty 30, 30d supply, fill #0
  Filled 2020-09-22: qty 30, 30d supply, fill #1
  Filled 2020-10-23: qty 30, 30d supply, fill #2
  Filled 2020-11-24: qty 30, 30d supply, fill #3
  Filled 2020-12-23: qty 30, 30d supply, fill #4
  Filled 2021-01-25: qty 30, 30d supply, fill #5

## 2020-08-05 MED ORDER — ESTRADIOL 0.1 MG/GM VA CREA
TOPICAL_CREAM | VAGINAL | 12 refills | Status: DC
  Filled 2020-08-05: qty 42.5, 30d supply, fill #0

## 2020-08-05 NOTE — Progress Notes (Signed)
Patient ID: Paula Jensen, female    DOB: 1965/10/10  Age: 55 y.o. MRN: [id number]  The patient is here for annual  preventive  examination and management of other chronic and acute problems.   The risk factors are reflected in the social history.  The roster of all physicians providing medical care to patient - is listed in the Snapshot section of the chart.  Activities of daily living:  The patient is 100% independent in all ADLs: dressing, toileting, feeding as well as independent mobility  Home safety : The patient has smoke detectors in the home. They wear seatbelts.  There are no firearms at home. There is no violence in the home.   There is no risks for hepatitis, STDs or HIV. There is no   history of blood transfusion. They have no travel history to infectious disease endemic areas of the world.  The patient has seen their dentist in the last six month. They have seen their eye doctor in the last year. She denies hearing difficulty with regard to whispered voices and some television programs.  [name]  They do not  have excessive sun exposure. Discussed the need for sun protection: hats, long sleeves and use of sunscreen if there is significant sun exposure.   Diet: the importance of a healthy diet is discussed. They do have a healthy diet.  The benefits of regular aerobic exercise were discussed. She walks 4 times per week ,  20 minutes.   Depression screen: there are no signs or vegative symptoms of depression- irritability, change in appetite, anhedonia, sadness/tearfullness.  Cognitive assessment: the patient manages all their financial and personal affairs and is actively engaged. They could relate day,date,year and events; recalled 2/3 objects at 3 minutes; performed clock-face test normally.  The following portions of the patient's history were reviewed and updated as appropriate: allergies, current medications, past family  history, past medical history,  past surgical history, past social history  and problem list.  Visual acuity was not assessed per patient preference since she has regular follow up with her ophthalmologist. Hearing and body mass index were assessed and reviewed.   During the course of the visit the patient was educated and counseled about appropriate screening and preventive services including : fall prevention , diabetes screening, nutrition counseling, colorectal cancer screening, and recommended immunizations.    CC: The primary encounter diagnosis was Insomnia, unspecified type. Diagnoses of Pap smear for cervical cancer screening, Encounter for screening mammogram for malignant neoplasm of breast, Condyloma acuminata, and Encounter for general adult medical examination with abnormal findings were also pertinent to this visit.  Genital growth  Present for  Several  Weeks  only hfeels irritated ith  Urination .  History of genital warts,  Years ago, treated with acetic aid     History Paula Jensen has a past medical history of allergic rhinitis, Cancer ([place]), Family history of adverse reaction to anesthesia, GERD (gastroesophageal reflux disease), and Wears contact lenses.   She has a past surgical history that includes Dilation and curettage of uterus (June 2012); Tonsillectomy and adenoidectomy; laparoscopy (1996); and Colonoscopy with propofol (N/A, 08/23/2019).   Her family history includes Atrial fibrillation in her father; Hypertension in her mother.She reports that she has been smoking. She has never used smokeless tobacco. She reports current alcohol use of about 10.0 standard drinks of alcohol per week. She reports that she does not use drugs.  Outpatient Medications Prior to Visit  Medication Sig Dispense Refill  . Magnesium 500 MG CAPS Take 2 capsules by mouth at bedtime.    . ALPRAZolam (XANAX) 0.25 MG tablet TAKE 1 TABLET BY MOUTH AT BEDTIME AS NEEDED. 30 tablet 1  . citalopram  (CELEXA) 40 MG tablet TAKE 1 TABLET BY MOUTH DAILY. 90 tablet 3  . estradiol (ESTRACE) 0.1 MG/GM vaginal cream Insert 1 gram intravaginally at bedtime daily for 2 weeks,  Then reduce use to twice weekly thereafter 42.5 g 12  . zolpidem (AMBIEN CR) 6.25 MG CR tablet TAKE 1 TABLET BY MOUTH AT BEDTIME AS NEEDED FOR SLEEP 30 tablet 3  . clindamycin (CLINDAGEL) 1 % gel Apply topically 2 (two) times daily. (Patient not taking: Reported on 08/05/2020) 30 g 5  . COVID-19 mRNA vaccine, Pfizer, 30 MCG/0.3ML injection USE AS DIRECTED (Patient not taking: Reported on 08/05/2020) .3 mL 0  . doxycycline (ADOXA) 50 MG tablet Take 1 tablet (50 mg total) by mouth daily. (Patient not taking: No sig reported) 30 tablet 6  . predniSONE (DELTASONE) 10 MG tablet TAKE 6 TABLETS BY MOUTH DAILY FOR 3 DAYS, THEN REDUCE BY 1 TABLET DAILY UNTIL GONE (Patient not taking: No sig reported) 33 tablet 0  . tizanidine (ZANAFLEX) 2 MG capsule TAKE 1 CAPSULE BY MOUTH 3 TIMES DAILY AS NEEDED FOR MUSCLE SPASMS. (Patient not taking: No sig reported) 60 capsule 1  . triamcinolone cream (KENALOG) 0.1 % Apply 1 application topically 2 (two) times daily. (Patient not taking: Reported on 08/05/2020) 30 g 0  . gabapentin (NEURONTIN) 100 MG capsule TAKE 2 CAPSULES (200 MG) BY MOUTH AT BEDTIME. (Patient not taking: Reported on 08/05/2020) 180 capsule 0   No facility-administered medications prior to visit.    Review of Systems   Patient denies headache, fevers, malaise, unintentional weight loss, skin rash, eye pain, sinus congestion and sinus pain, sore throat, dysphagia,  hemoptysis , cough, dyspnea, wheezing, chest pain, palpitations, orthopnea, edema, abdominal pain, nausea, melena, diarrhea, constipation, flank pain, dysuria, hematuria, urinary  Frequency, nocturia, numbness, tingling, seizures,  Focal weakness, Loss of consciousness,  Tremor, insomnia, depression, anxiety, and suicidal ideation.     Objective:  BP 102/68 (BP Location: Left  Arm, Patient Position: Sitting, Cuff Size: Normal)   Pulse 75   Temp 98 F (36.7 C) (Oral)   Ht 5\' 7"  (1.702 m)   Wt 169 lb (76.7 kg)   LMP 05/05/2016 (Approximate)   SpO2 97%   BMI 26.47 kg/m   Physical Exam  General Appearance:    Alert, cooperative, no distress, appears stated age  Head:    Normocephalic, without obvious abnormality, atraumatic  Eyes:    PERRL, conjunctiva/corneas clear, EOM's intact, fundi    benign, both eyes  Ears:    Normal TM's and external ear canals, both ears  Nose:   Nares normal, septum midline, mucosa normal, no drainage    or sinus tenderness  Throat:   Lips, mucosa, and tongue normal; teeth and gums normal  Neck:   Supple, symmetrical, trachea midline, no adenopathy;    thyroid:  no enlargement/tenderness/nodules; no carotid   bruit or JVD  Back:     Symmetric, no curvature, ROM normal, no CVA tenderness  Lungs:     Clear to auscultation bilaterally, respirations unlabored  Chest Wall:    No tenderness or deformity   Heart:    Regular rate and rhythm, S1 and S2 normal, no murmur, rub   or gallop  Breast Exam:    No tenderness,  masses, or nipple abnormality  Abdomen:     Soft, non-tender, bowel sounds active all four quadrants,    no masses, no organomegaly  Genitalia:    Pelvic: cervix normal in appearance, external genitalia notable for condyloma at 5:00 position on labia minora,  no adnexal masses or tenderness, no cervical motion tenderness, rectovaginal septum normal, uterus normal size, shape, and consistency and vagina normal without discharge  Extremities:   Extremities normal, atraumatic, no cyanosis or edema  Pulses:   2+ and symmetric all extremities  Skin:   Skin color, texture, turgor normal, no rashes or lesions  Lymph nodes:   Cervical, supraclavicular, and axillary nodes normal  Neurologic:   CNII-XII intact, normal strength, sensation and reflexes    throughout     Assessment & Plan:   Problem List Items Addressed This Visit       Unprioritized   Condyloma acuminata    Suggested by exam and history of remote occurrence. Symptoms are limited to irritation during urination.  Offered Aldara, which she she deferred at this time but may decide to try in a few weeks if the CA's do not resolve.       Encounter for general adult medical examination with abnormal findings    age appropriate education and counseling updated, referrals for preventative services and immunizations addressed, dietary and smoking counseling addressed, most recent labs reviewed.  I have personally reviewed and have noted:  1) the patient's medical and social history 2) The pt's use of alcohol, tobacco, and illicit drugs 3) The patient's current medications and supplements 4) Functional ability including ADL's, fall risk, home safety risk, hearing and visual impairment 5) Diet and physical activities 6) Evidence for depression or mood disorder 7) The patient's height, weight, and BMI have been recorded in the chart  I have made referrals, and provided counseling and education based on review of the above      Insomnia disorder - Primary    Chronic, with no improvement in prior trials of over-the-counter first generation antihistamines.  Continue alternating ambien 5 mg  with alprazolam 0.25 mg . The risks and benefits of benzodiazepine use were reviewed with patient today including excessive sedation leading to respiratory depression,  impaired thinking/driving, and addiction.  Patient was advised to avoid concurrent use with alcohol, to use medication only as needed and not to share with others  .         Other Visit Diagnoses    Pap smear for cervical cancer screening       Relevant Orders   Cytology - PAP (Completed)   Encounter for screening mammogram for malignant neoplasm of breast       Relevant Orders   MM 3D SCREEN BREAST BILATERAL      I have discontinued Paula Jensen "Lori"'s gabapentin. I am also having her  maintain her Magnesium, clindamycin, triamcinolone cream, doxycycline, tizanidine, predniSONE, COVID-19 mRNA vaccine (Pfizer), ALPRAZolam, citalopram, estradiol, and zolpidem.  Meds ordered this encounter  Medications  . ALPRAZolam (XANAX) 0.25 MG tablet    Sig: TAKE 1 TABLET BY MOUTH AT BEDTIME AS NEEDED.    Dispense:  30 tablet    Refill:  1  . citalopram (CELEXA) 40 MG tablet    Sig: TAKE 1 TABLET BY MOUTH DAILY.    Dispense:  90 tablet    Refill:  3  . estradiol (ESTRACE) 0.1 MG/GM vaginal cream    Sig: Insert 1 gram intravaginally at bedtime daily for 2 weeks,  Then reduce use to twice weekly thereafter    Dispense:  42.5 g    Refill:  12  . zolpidem (AMBIEN CR) 6.25 MG CR tablet    Sig: TAKE 1 TABLET BY MOUTH AT BEDTIME AS NEEDED FOR SLEEP    Dispense:  30 tablet    Refill:  5    Medications Discontinued During This Encounter  Medication Reason  . ALPRAZolam (XANAX) 0.25 MG tablet Reorder  . gabapentin (NEURONTIN) 100 MG capsule   . estradiol (ESTRACE) 0.1 MG/GM vaginal cream Reorder  . citalopram (CELEXA) 40 MG tablet Reorder  . zolpidem (AMBIEN CR) 6.25 MG CR tablet Reorder    Follow-up: No follow-ups on file.   Crecencio Mc, MD

## 2020-08-05 NOTE — Assessment & Plan Note (Signed)
Suggested by exam and history of remote occurrence. Symptoms are limited to irritation during urination.  Offered Aldara, which she she deferred at this time but may decide to try in a few weeks if the CA's do not resolve.

## 2020-08-05 NOTE — Patient Instructions (Signed)
Your annual mammogram has been ordered.  You are encouraged (required) to call to make your appointment at [phone]  Non fasting labs will be ordered for you to get done at your office.     Genital Warts  Genital warts are a common STI (sexually transmitted infection). They may appear as small bumps on the skin of the genital and anal areas. They sometimes become irritated and cause pain. Genital warts are easily passed to other people through sexual contact. Many people do not know that they are infected, and they may be infected for years without symptoms. Even without symptoms, they can pass the infection to their sexual partners. What are the causes? This condition is caused by a virus that is called human papillomavirus (HPV). HPV is spread by having unprotected sex with an infected person. It can be spread through vaginal, anal, and oral sex. What increases the risk? You are more likely to develop this condition if:  You have unprotected sex.  You have multiple sexual partners.  You are sexually active before age 50.  You are a man who is not circumcised.  You have a female sexual partner who is not circumcised.  You have a weakened body defense system (immune system) due to disease or medicine. What are the signs or symptoms? Symptoms of this condition include:  Small growths in the genital area or anal area. These warts often grow in clusters.  Itching and irritation in the genital area or anal area.  Bleeding from the warts.  Pain during sex. How is this diagnosed? This condition is diagnosed based on your symptoms and a physical exam. You may also have other tests, including:  Biopsy. A tissue sample is removed so it can be checked under a microscope.  Colposcopy. In females, a magnifying tool is used to examine the vagina and cervix. Certain solutions may be used to make the HPV cells change color so they can be seen more easily.  A Pap test in  females.  Tests for other STIs. How is this treated? This condition may be treated with:  Medicines, such as solutions or creams that are applied to your skin (topical).  Procedures, such as: ? Freezing the warts with liquid nitrogen (cryotherapy). ? Burning the warts with a laser or electric probe (electrocautery). ? Surgery to remove the warts. Getting treatment is important because genital warts can lead to other problems. In females, the virus that causes genital warts may increase the risk for cervical cancer. Follow these instructions at home: Medicines  Apply over-the-counter and prescription medicines only as told by your health care provider.  Do not treat genital warts with medicines that are used for treating hand warts.  Talk with your health care provider about using over-the-counter anti-itch creams.   Instructions for women  Get screened regularly for cervical cancer. This type of cancer is slow growing and can almost always be cured if it is found early.  If you become pregnant, tell your health care provider that you have had an HPV infection. Your health care provider will monitor you closely during pregnancy. General instructions  Do not touch or scratch the warts.  Do not have sex until your treatment has been completed.  Tell your current and past sexual partners about your condition because they may also need treatment.  After treatment, use condoms during sex to prevent future infections.  Keep all follow-up visits as told by your health care provider. This is important.  How is this prevented? Talk with your health care provider about getting the HPV vaccine. The vaccine:  Can prevent some HPV infections and cancers.  Is recommended for males and females who are 60-45 years old.  Is not recommended for pregnant women.  Will not work if you already have HPV. Contact a health care provider if you:  Have redness, swelling, or pain in the area of  the treated skin.  Have a fever.  Feel generally ill.  Feel lumps in and around your genital or anal area.  Have bleeding in your genital or anal area.  Have pain during sex or bleeding after sex. Summary  Genital warts are a common STI (sexually transmitted infection). It may appear as small bumps on the genital and anal areas.  This condition is caused by a virus that is called human papillomavirus (HPV). HPV is spread by having unprotected sex with an infected person. It can be spread through vaginal, anal, and oral sex.  Treatment is important because genital warts can lead to other problems. In females, the virus that causes genital warts may increase the risk for cervical cancer.  This condition may be treated with medicine that is applied to the skin or procedures to remove the warts.  The HPV vaccine can prevent some HPV infections and cancers. It is recommended that the vaccine be given to males and females who are 74-55 years old. This information is not intended to replace advice given to you by your health care provider. Make sure you discuss any questions you have with your health care provider. Document Revised: 01/17/2019 Document Reviewed: 01/17/2019 Elsevier Patient Education  Paula Jensen.

## 2020-08-07 LAB — CYTOLOGY - PAP
Comment: NEGATIVE
Diagnosis: NEGATIVE
High risk HPV: NEGATIVE

## 2020-08-08 NOTE — Assessment & Plan Note (Signed)
Chronic, with no improvement in prior trials of over-the-counter first generation antihistamines.  Continue alternating ambien 5 mg  with alprazolam 0.25 mg . The risks and benefits of benzodiazepine use were reviewed with patient today including excessive sedation leading to respiratory depression,  impaired thinking/driving, and addiction.  Patient was advised to avoid concurrent use with alcohol, to use medication only as needed and not to share with others  .

## 2020-08-08 NOTE — Assessment & Plan Note (Signed)

## 2020-08-24 ENCOUNTER — Other Ambulatory Visit: Payer: Self-pay

## 2020-09-02 ENCOUNTER — Other Ambulatory Visit: Payer: Self-pay

## 2020-09-22 ENCOUNTER — Other Ambulatory Visit: Payer: Self-pay

## 2020-09-23 MED ORDER — ONDANSETRON 8 MG PO TBDP
8.0000 mg | ORAL_TABLET | Freq: Three times a day (TID) | ORAL | 0 refills | Status: DC | PRN
  Filled 2020-09-23: qty 20, 7d supply, fill #0

## 2020-09-24 ENCOUNTER — Other Ambulatory Visit: Payer: Self-pay

## 2020-09-24 ENCOUNTER — Ambulatory Visit
Admission: RE | Admit: 2020-09-24 | Discharge: 2020-09-24 | Disposition: A | Discharge status: Home / Self Care | Payer: No Typology Code available for payment source | Source: Ambulatory Visit | Attending: Internal Medicine | Admitting: Internal Medicine

## 2020-09-24 DIAGNOSIS — Z1231 Encounter for screening mammogram for malignant neoplasm of breast: Secondary | ICD-10-CM | POA: Insufficient documentation

## 2020-09-25 ENCOUNTER — Other Ambulatory Visit: Payer: Self-pay

## 2020-10-23 ENCOUNTER — Other Ambulatory Visit: Payer: Self-pay

## 2020-11-04 ENCOUNTER — Ambulatory Visit: Payer: Self-pay

## 2020-11-04 NOTE — Telephone Encounter (Signed)
Please advise, no available appointments in office this week

## 2020-11-04 NOTE — Telephone Encounter (Signed)
[facility] went to [facility] and they told her that they could not help her with the testing she needs. Please advise

## 2020-11-06 NOTE — Telephone Encounter (Signed)
Patient called back, she went to pharmacy and bought over the counter medication. She said she thinks it has to be from anxiety and not eating well in the past few days. Medication she is taking seems to be helping. She said thanks for calling her, but she thinks she will be ok.

## 2020-11-18 ENCOUNTER — Other Ambulatory Visit: Payer: Self-pay | Admitting: Internal Medicine

## 2020-11-18 ENCOUNTER — Other Ambulatory Visit: Payer: Self-pay

## 2020-11-18 MED FILL — Alprazolam Tab 0.25 MG: ORAL | 30 days supply | Qty: 30 | Fill #0 | Status: AC

## 2020-11-18 NOTE — Telephone Encounter (Signed)
RX Refill:xanax Last Seen:08-05-20 Last ordered:08-05-20

## 2020-11-19 ENCOUNTER — Other Ambulatory Visit: Payer: Self-pay

## 2020-11-24 ENCOUNTER — Other Ambulatory Visit: Payer: Self-pay

## 2020-12-11 ENCOUNTER — Telehealth: Payer: No Typology Code available for payment source | Admitting: Internal Medicine

## 2020-12-23 ENCOUNTER — Other Ambulatory Visit: Payer: Self-pay

## 2021-01-18 ENCOUNTER — Other Ambulatory Visit: Payer: Self-pay

## 2021-01-18 MED ORDER — DOXYCYCLINE MONOHYDRATE 50 MG PO TABS
50.0000 mg | ORAL_TABLET | Freq: Every day | ORAL | 6 refills | Status: DC
  Filled 2021-01-18 (×2): qty 30, 30d supply, fill #0

## 2021-01-18 MED ORDER — CLINDAMYCIN PHOSPHATE 1 % EX GEL
Freq: Two times a day (BID) | CUTANEOUS | 5 refills | Status: DC
  Filled 2021-01-18: qty 30, 30d supply, fill #0

## 2021-01-20 ENCOUNTER — Other Ambulatory Visit: Payer: Self-pay | Admitting: Internal Medicine

## 2021-01-20 ENCOUNTER — Other Ambulatory Visit: Payer: Self-pay

## 2021-01-21 ENCOUNTER — Other Ambulatory Visit: Payer: Self-pay

## 2021-01-21 ENCOUNTER — Other Ambulatory Visit: Payer: Self-pay | Admitting: Internal Medicine

## 2021-01-25 ENCOUNTER — Other Ambulatory Visit: Payer: Self-pay

## 2021-01-25 MED FILL — Alprazolam Tab 0.25 MG: ORAL | 30 days supply | Qty: 30 | Fill #1 | Status: AC

## 2021-01-25 NOTE — Telephone Encounter (Signed)
[facility] pharmacy sent over a fax stating that the clindamycin gel is not covered by pt's insurance and would like to see if the clindamycin phosphate solution can be sent in. I have pended the medication requested.

## 2021-01-26 ENCOUNTER — Other Ambulatory Visit: Payer: Self-pay

## 2021-01-26 MED ORDER — CLINDAMYCIN PHOSPHATE 1 % EX SOLN
Freq: Two times a day (BID) | CUTANEOUS | 0 refills | Status: DC
  Filled 2021-01-26: qty 30, 20d supply, fill #0

## 2021-01-26 NOTE — Telephone Encounter (Signed)
Thanks. Sent.

## 2021-02-05 ENCOUNTER — Encounter: Payer: Self-pay | Admitting: Internal Medicine

## 2021-02-05 ENCOUNTER — Ambulatory Visit (INDEPENDENT_AMBULATORY_CARE_PROVIDER_SITE_OTHER): Payer: No Typology Code available for payment source | Admitting: Internal Medicine

## 2021-02-05 ENCOUNTER — Other Ambulatory Visit: Payer: Self-pay

## 2021-02-05 VITALS — BP 130/86 | HR 75 | Temp 96.5°F | Ht 67.0 in | Wt 176.8 lb

## 2021-02-05 DIAGNOSIS — E782 Mixed hyperlipidemia: Secondary | ICD-10-CM

## 2021-02-05 DIAGNOSIS — F411 Generalized anxiety disorder: Secondary | ICD-10-CM | POA: Diagnosis not present

## 2021-02-05 DIAGNOSIS — E785 Hyperlipidemia, unspecified: Secondary | ICD-10-CM | POA: Insufficient documentation

## 2021-02-05 DIAGNOSIS — E663 Overweight: Secondary | ICD-10-CM

## 2021-02-05 DIAGNOSIS — R5383 Other fatigue: Secondary | ICD-10-CM | POA: Diagnosis not present

## 2021-02-05 DIAGNOSIS — E7800 Pure hypercholesterolemia, unspecified: Secondary | ICD-10-CM | POA: Diagnosis not present

## 2021-02-05 DIAGNOSIS — F320 Major depressive disorder, single episode, mild: Secondary | ICD-10-CM | POA: Diagnosis not present

## 2021-02-05 DIAGNOSIS — G4700 Insomnia, unspecified: Secondary | ICD-10-CM

## 2021-02-05 LAB — COMPREHENSIVE METABOLIC PANEL
ALT: 10 U/L (ref 0–35)
AST: 14 U/L (ref 0–37)
Albumin: 4.3 g/dL (ref 3.5–5.2)
Alkaline Phosphatase: 130 U/L — ABNORMAL HIGH (ref 39–117)
BUN: 18 mg/dL (ref 6–23)
CO2: 30 mEq/L (ref 19–32)
Calcium: 8.9 mg/dL (ref 8.4–10.5)
Chloride: 102 mEq/L (ref 96–112)
Creatinine, Ser: 0.77 mg/dL (ref 0.40–1.20)
GFR: 86.08 mL/min (ref >60.00)
Glucose, Bld: 102 mg/dL — ABNORMAL HIGH (ref 70–99)
Potassium: 4.5 mEq/L (ref 3.5–5.1)
Sodium: 139 mEq/L (ref 135–145)
Total Bilirubin: 0.4 mg/dL (ref 0.2–1.2)
Total Protein: 6.9 g/dL (ref 6.0–8.3)

## 2021-02-05 LAB — LIPID PANEL
Cholesterol: 180 mg/dL (ref 0–200)
HDL: 92.9 mg/dL (ref >39.00)
LDL Cholesterol: 75 mg/dL (ref 0–99)
NonHDL: 86.82
Total CHOL/HDL Ratio: 2
Triglycerides: 58 mg/dL (ref 0.0–149.0)
VLDL: 11.6 mg/dL (ref 0.0–40.0)

## 2021-02-05 LAB — TSH: TSH: 1 u[IU]/mL (ref 0.35–5.50)

## 2021-02-05 MED ORDER — BUPROPION HCL ER (XL) 150 MG PO TB24
150.0000 mg | ORAL_TABLET | Freq: Every day | ORAL | 2 refills | Status: DC
  Filled 2021-02-05: qty 30, 30d supply, fill #0
  Filled 2021-03-02: qty 30, 30d supply, fill #1

## 2021-02-05 MED ORDER — ZOLPIDEM TARTRATE ER 6.25 MG PO TBCR
EXTENDED_RELEASE_TABLET | Freq: Every evening | ORAL | 5 refills | Status: DC | PRN
  Filled 2021-02-05: qty 30, fill #0
  Filled 2021-02-23: qty 30, 30d supply, fill #0
  Filled 2021-03-26: qty 30, 30d supply, fill #1
  Filled 2021-04-21 – 2021-04-23 (×2): qty 30, 30d supply, fill #2
  Filled 2021-05-21: qty 30, 30d supply, fill #3

## 2021-02-05 MED ORDER — ALPRAZOLAM 0.25 MG PO TABS
ORAL_TABLET | Freq: Every evening | ORAL | 5 refills | Status: DC | PRN
  Filled 2021-02-05: qty 30, fill #0
  Filled 2021-03-26: qty 30, 30d supply, fill #0

## 2021-02-05 NOTE — Assessment & Plan Note (Signed)
Excellent panel in April 2021 after losing weight.  Repeat needed  Lab Results  Component Value Date   CHOL 177 07/19/2019   HDL 84.40 07/19/2019   LDLCALC 77 07/19/2019   LDLDIRECT 107.1 03/28/2013   TRIG 75.0 07/19/2019   CHOLHDL 2 07/19/2019

## 2021-02-05 NOTE — Progress Notes (Signed)
Subjective:  Patient ID: Paula Jensen, female    DOB: Dec 11, 1965  Age: 55 y.o. MRN: [id number]  CC: The primary encounter diagnosis was Pure hypercholesterolemia. Diagnoses of Other fatigue, Anxiety, generalized, Depression, major, single episode, mild ([place]), Overweight (BMI 25.0-29.9), Insomnia, unspecified type, and Moderate mixed hyperlipidemia not requiring statin therapy were also pertinent to this visit.  HPI Lerin JALIN Jensen presents for  Chief Complaint  Patient presents with   Follow-up    6 month follow up for medication refills   This visit occurred during the SARS-CoV-2 public health emergency.  Safety protocols were in place, including screening questions prior to the visit, additional usage of staff PPE, and extensive cleaning of exam room while observing appropriate contact time as indicated for disinfecting solutions.   Patient is taking her medications as prescribed and notes no adverse effects.  She is using alprazolam at bedtime  Anxiety is 'incapacitating' at times ,  which makes her depressed and unmotivated.  Her father is  under treatment as well.  Daughter getting married this spring.  Some trouble focusing .  No panic attacks.   auer mother in law Paula Jensen is currently hospitalized with multiple PE's from DVT in leg.  Paula Jensen.    Outpatient Medications Prior to Visit  Medication Sig Dispense Refill   citalopram (CELEXA) 40 MG tablet TAKE 1 TABLET BY MOUTH DAILY. 90 tablet 3   clindamycin (CLEOCIN-T) 1 % external solution Apply topically 2 (two) times daily. 30 mL 0   doxycycline (ADOXA) 50 MG tablet Take 1 tablet (50 mg total) by mouth daily. 30 tablet 6   estradiol (ESTRACE) 0.1 MG/GM vaginal cream Insert 1 gram intravaginally at bedtime daily for 2 weeks,  Then reduce use to twice weekly thereafter 42.5 g 12   Magnesium 500 MG CAPS Take 2 capsules by mouth at bedtime.     ondansetron (ZOFRAN-ODT) 8 MG disintegrating tablet Take 1 tablet (8  mg total) by mouth every 8 (eight) hours as needed for nausea or vomiting. 20 tablet 0   ALPRAZolam (XANAX) 0.25 MG tablet TAKE 1 TABLET BY MOUTH AT BEDTIME AS NEEDED. 30 tablet 1   zolpidem (AMBIEN CR) 6.25 MG CR tablet TAKE 1 TABLET BY MOUTH AT BEDTIME AS NEEDED FOR SLEEP 30 tablet 5   clindamycin (CLINDAGEL) 1 % gel Apply topically 2 (two) times daily. 30 g 5   triamcinolone cream (KENALOG) 0.1 % Apply 1 application topically 2 (two) times daily. (Patient not taking: Reported on 08/05/2020) 30 g 0   No facility-administered medications prior to visit.    Review of Systems;  Patient denies headache, fevers, malaise, unintentional weight loss, skin rash, eye pain, sinus congestion and sinus pain, sore throat, dysphagia,  hemoptysis , cough, dyspnea, wheezing, chest pain, palpitations, orthopnea, edema, abdominal pain, nausea, melena, diarrhea, constipation, flank pain, dysuria, hematuria, urinary  Frequency, nocturia, numbness, tingling, seizures,  Focal weakness, Loss of consciousness,  Tremor, insomnia, depression, anxiety, and suicidal ideation.      Objective:  BP 130/86 (BP Location: Left Arm, Patient Position: Sitting, Cuff Size: Normal)   Pulse 75   Temp (!) 96.5 F (35.8 C) (Temporal)   Ht '5\' 7"'  (1.702 m)   Wt 176 lb 12.8 oz (80.2 kg)   LMP 05/05/2016 (Approximate)   SpO2 97%   BMI 27.69 kg/m   BP Readings from Last 3 Encounters:  02/05/21 130/86  08/05/20 102/68  04/30/20 (!) 154/99    Wt Readings from Last 3 Encounters:  02/05/21 176 lb 12.8 oz (80.2 kg)  08/05/20 169 lb (76.7 kg)  04/30/20 161 lb (73 kg)    General appearance: alert, cooperative and appears stated age Ears: normal TM's and external ear canals both ears Throat: lips, mucosa, and tongue normal; teeth and gums normal Neck: no adenopathy, no carotid bruit, supple, symmetrical, trachea midline and thyroid not enlarged, symmetric, no tenderness/mass/nodules Back: symmetric, no curvature. ROM normal. No  CVA tenderness. Lungs: clear to auscultation bilaterally Heart: regular rate and rhythm, S1, S2 normal, no murmur, click, rub or gallop Abdomen: soft, non-tender; bowel sounds normal; no masses,  no organomegaly Pulses: 2+ and symmetric Skin: Skin color, texture, turgor normal. No rashes or lesions Lymph nodes: Cervical, supraclavicular, and axillary nodes normal.  No results found for: HGBA1C  Lab Results  Component Value Date   CREATININE 0.73 07/19/2019   CREATININE 0.83 10/22/2018   CREATININE 0.76 04/13/2018    Lab Results  Component Value Date   WBC 7.5 10/22/2018   HGB 14.1 10/22/2018   HCT 42.7 10/22/2018   PLT 209 10/22/2018   GLUCOSE 103 (H) 07/19/2019   CHOL 177 07/19/2019   TRIG 75.0 07/19/2019   HDL 84.40 07/19/2019   LDLDIRECT 107.1 03/28/2013   LDLCALC 77 07/19/2019   ALT 14 09/17/2019   AST 14 09/17/2019   NA 139 07/19/2019   K 4.0 07/19/2019   CL 103 07/19/2019   CREATININE 0.73 07/19/2019   BUN 24 (H) 07/19/2019   CO2 27 07/19/2019   TSH 0.92 07/19/2019   INR 1.0 10/22/2018   INR 1.0 10/22/2018    MM 3D SCREEN BREAST BILATERAL  Result Date: 09/29/2020 CLINICAL DATA:  Screening. EXAM: DIGITAL SCREENING BILATERAL MAMMOGRAM WITH TOMOSYNTHESIS AND CAD TECHNIQUE: Bilateral screening digital craniocaudal and mediolateral oblique mammograms were obtained. Bilateral screening digital breast tomosynthesis was performed. The images were evaluated with computer-aided detection. COMPARISON:  Previous exam(s). ACR Breast Density Category b: There are scattered areas of fibroglandular density. FINDINGS: There are no findings suspicious for malignancy. IMPRESSION: No mammographic evidence of malignancy. A result letter of this screening mammogram will be mailed directly to the patient. RECOMMENDATION: Screening mammogram in one year. (Code:SM-B-01Y) BI-RADS CATEGORY  1: Negative. Electronically Signed   By: Everlean Alstrom M.D.   On: 09/29/2020 11:16   Assessment &  Plan:   Problem List Items Addressed This Visit     Overweight (BMI 25.0-29.9)    Reviewed her success with the [place] and encouraged her to restrict portions and resume exercise      Anxiety, generalized    Managed with citalopram and prn alprazolam for insomnia       Relevant Medications   buPROPion (WELLBUTRIN XL) 150 MG 24 hr tablet   ALPRAZolam (XANAX) 0.25 MG tablet   Insomnia disorder    Chronic, with no improvement in prior trials of over-the-counter first generation antihistamines.  Continue alternating ambien 5 mg  with alprazolam 0.25 mg . The risks and benefits of benzodiazepine use were reviewed with patient today including excessive sedation leading to respiratory depression,  impaired thinking/driving, and addiction.  Patient was advised to avoid concurrent use with alcohol, to use medication only as needed and not to share with others  .        Depression, major, single episode, mild ([facility])    Adding wellbutrin xl 150 mg daily to citalopram for negative symptoms (anhedonia, lack of motivation).  encouraged to resume exercise and moderation of alcohol  Relevant Medications   buPROPion (WELLBUTRIN XL) 150 MG 24 hr tablet   ALPRAZolam (XANAX) 0.25 MG tablet   Hyperlipidemia - Primary    Excellent panel in April 2021 after losing weight.  Repeat needed  Lab Results  Component Value Date   CHOL 177 07/19/2019   HDL 84.40 07/19/2019   LDLCALC 77 07/19/2019   LDLDIRECT 107.1 03/28/2013   TRIG 75.0 07/19/2019   CHOLHDL 2 07/19/2019         Relevant Orders   Lipid Profile   Comp Met (CMET)   Other Visit Diagnoses     Other fatigue       Relevant Orders   TSH       I have discontinued Ja G. Sharlett Iles "Lori"'s triamcinolone cream. I am also having her start on buPROPion. Additionally, I am having her maintain her Magnesium, citalopram, estradiol, ondansetron, doxycycline, clindamycin, ALPRAZolam, and zolpidem.  Meds ordered this encounter   Medications   buPROPion (WELLBUTRIN XL) 150 MG 24 hr tablet    Sig: Take 1 tablet (150 mg total) by mouth daily.    Dispense:  30 tablet    Refill:  2   ALPRAZolam (XANAX) 0.25 MG tablet    Sig: TAKE 1 TABLET BY MOUTH AT BEDTIME AS NEEDED.    Dispense:  30 tablet    Refill:  5   zolpidem (AMBIEN CR) 6.25 MG CR tablet    Sig: TAKE 1 TABLET BY MOUTH AT BEDTIME AS NEEDED FOR SLEEP    Dispense:  30 tablet    Refill:  5    Medications Discontinued During This Encounter  Medication Reason   clindamycin (CLINDAGEL) 1 % gel    triamcinolone cream (KENALOG) 0.1 %    ALPRAZolam (XANAX) 0.25 MG tablet Reorder   zolpidem (AMBIEN CR) 6.25 MG CR tablet Reorder    Follow-up: No follow-ups on file.   Crecencio Mc, MD

## 2021-02-05 NOTE — Patient Instructions (Addendum)
Adding Wellbutrin 150 mg once daily in the morning,  to your citalopram. It is stimulating so take it in the morning  Give the combination 2 weeks and then send me a message  Get back to yoga/exercise

## 2021-02-05 NOTE — Assessment & Plan Note (Signed)
Reviewed her success with the [place] and encouraged her to restrict portions and resume exercise

## 2021-02-05 NOTE — Assessment & Plan Note (Signed)
Adding wellbutrin xl 150 mg daily to citalopram for negative symptoms (anhedonia, lack of motivation).  encouraged to resume exercise and moderation of alcohol

## 2021-02-05 NOTE — Assessment & Plan Note (Signed)
Chronic, with no improvement in prior trials of over-the-counter first generation antihistamines.  Continue alternating ambien 5 mg  with alprazolam 0.25 mg . The risks and benefits of benzodiazepine use were reviewed with patient today including excessive sedation leading to respiratory depression,  impaired thinking/driving, and addiction.  Patient was advised to avoid concurrent use with alcohol, to use medication only as needed and not to share with others  .

## 2021-02-05 NOTE — Assessment & Plan Note (Signed)
Managed with citalopram and prn alprazolam for insomnia

## 2021-02-23 ENCOUNTER — Other Ambulatory Visit: Payer: Self-pay

## 2021-02-25 ENCOUNTER — Encounter: Payer: Self-pay | Admitting: Internal Medicine

## 2021-03-02 ENCOUNTER — Other Ambulatory Visit: Payer: Self-pay

## 2021-03-02 ENCOUNTER — Other Ambulatory Visit: Payer: Self-pay | Admitting: Internal Medicine

## 2021-03-02 MED ORDER — BUPROPION HCL ER (XL) 300 MG PO TB24
300.0000 mg | ORAL_TABLET | Freq: Every day | ORAL | 1 refills | Status: DC
  Filled 2021-03-02: qty 90, 90d supply, fill #0
  Filled 2021-06-03: qty 90, 90d supply, fill #1

## 2021-03-03 ENCOUNTER — Other Ambulatory Visit: Payer: Self-pay

## 2021-03-25 ENCOUNTER — Other Ambulatory Visit: Payer: Self-pay

## 2021-03-26 ENCOUNTER — Other Ambulatory Visit: Payer: Self-pay

## 2021-04-21 ENCOUNTER — Other Ambulatory Visit: Payer: Self-pay

## 2021-04-23 ENCOUNTER — Other Ambulatory Visit: Payer: Self-pay

## 2021-05-21 ENCOUNTER — Other Ambulatory Visit: Payer: Self-pay

## 2021-05-25 ENCOUNTER — Other Ambulatory Visit: Payer: Self-pay

## 2021-05-25 ENCOUNTER — Ambulatory Visit (INDEPENDENT_AMBULATORY_CARE_PROVIDER_SITE_OTHER): Payer: No Typology Code available for payment source | Admitting: Internal Medicine

## 2021-05-25 ENCOUNTER — Encounter: Payer: Self-pay | Admitting: Internal Medicine

## 2021-05-25 VITALS — BP 120/88 | HR 83 | Temp 98.3°F | Ht 67.0 in | Wt 174.0 lb

## 2021-05-25 DIAGNOSIS — R748 Abnormal levels of other serum enzymes: Secondary | ICD-10-CM

## 2021-05-25 DIAGNOSIS — Z1231 Encounter for screening mammogram for malignant neoplasm of breast: Secondary | ICD-10-CM | POA: Diagnosis not present

## 2021-05-25 DIAGNOSIS — K760 Fatty (change of) liver, not elsewhere classified: Secondary | ICD-10-CM | POA: Diagnosis not present

## 2021-05-25 DIAGNOSIS — G4700 Insomnia, unspecified: Secondary | ICD-10-CM

## 2021-05-25 DIAGNOSIS — F320 Major depressive disorder, single episode, mild: Secondary | ICD-10-CM | POA: Diagnosis not present

## 2021-05-25 DIAGNOSIS — F411 Generalized anxiety disorder: Secondary | ICD-10-CM

## 2021-05-25 MED ORDER — ALPRAZOLAM 0.25 MG PO TABS
ORAL_TABLET | Freq: Every evening | ORAL | 5 refills | Status: AC | PRN
  Filled 2021-05-25: qty 30, 30d supply, fill #0
  Filled 2021-07-20: qty 30, 30d supply, fill #1
  Filled 2021-10-06: qty 30, 30d supply, fill #2
  Filled 2021-11-01: qty 30, 30d supply, fill #3

## 2021-05-25 MED ORDER — ZOLPIDEM TARTRATE ER 6.25 MG PO TBCR
EXTENDED_RELEASE_TABLET | Freq: Every evening | ORAL | 5 refills | Status: DC | PRN
  Filled 2021-05-25 – 2021-06-18 (×3): qty 30, 30d supply, fill #0
  Filled 2021-07-20: qty 30, 30d supply, fill #1
  Filled 2021-08-19: qty 30, 30d supply, fill #2
  Filled 2021-09-16: qty 30, 30d supply, fill #3
  Filled 2021-10-18: qty 30, 30d supply, fill #4
  Filled 2021-11-12 – 2021-11-15 (×2): qty 30, 30d supply, fill #5

## 2021-05-25 NOTE — Assessment & Plan Note (Signed)
Managed with celexa and prn alprazolam. Using ambien CR for  chronic insomnia  ?

## 2021-05-25 NOTE — Assessment & Plan Note (Signed)
Chronic, with no improvement in prior trials of over-the-counter first generation antihistamines.  Continue alternating ambien 5 mg  with alprazolam 0.25 mg . The risks and benefits of benzodiazepine use were reviewed with patient today including excessive sedation leading to respiratory depression,  impaired thinking/driving, and addiction.  Patient was advised to avoid concurrent use with alcohol, to use medication only as needed and not to share with others  .   ?

## 2021-05-25 NOTE — Progress Notes (Signed)
? ?Subjective:  ?Patient ID: Paula Jensen, female    DOB: 14-Jul-1965  Age: 56 y.o. MRN: [id number] ? ?CC: The primary encounter diagnosis was Encounter for screening mammogram for malignant neoplasm of breast. Diagnoses of Fatty liver, Alkaline phosphatase elevation, Hepatic steatosis, Depression, major, single episode, mild ([place]), Anxiety, generalized, and Insomnia, unspecified type were also pertinent to this visit. ? ? ?This visit occurred during the SARS-CoV-2 public health emergency.  Safety protocols were in place, including screening questions prior to the visit, additional usage of staff PPE, and extensive cleaning of exam room while observing appropriate contact time as indicated for disinfecting solutions.   ? ?HPI ?Paula Jensen presents for  ?Chief Complaint  ?Patient presents with  ? Follow-up  ?  Follow up for medication management  ? ?56 yr old female with history of depression/anxiety with recent request for dose escalation of wellbutrin to 300 mg in December .  She is feeling much better on the 300 mg dose.  Multiple stressors cited as reason.  Managing all well.  Kids are doing fine ,  oldest daughter getting married in July,  son applying for early admission at [facility]. ? ?Anorgasmia;  wonders if it is a medication side effect.  Libido is strong.  ? ? ? ?Outpatient Medications Prior to Visit  ?Medication Sig Dispense Refill  ? buPROPion (WELLBUTRIN XL) 300 MG 24 hr tablet Take 1 tablet (300 mg total) by mouth daily. 90 tablet 1  ? citalopram (CELEXA) 40 MG tablet TAKE 1 TABLET BY MOUTH DAILY. 90 tablet 3  ? clindamycin (CLEOCIN-T) 1 % external solution Apply topically 2 (two) times daily. 30 mL 0  ? doxycycline (ADOXA) 50 MG tablet Take 1 tablet (50 mg total) by mouth daily. 30 tablet 6  ? estradiol (ESTRACE) 0.1 MG/GM vaginal cream Insert 1 gram intravaginally at bedtime daily for 2 weeks,  Then reduce use to twice weekly thereafter 42.5 g 12  ? Magnesium 500 MG CAPS Take 2 capsules  by mouth at bedtime.    ? Milk Thistle 1000 MG CAPS Take 1 capsule by mouth daily at 2 am.    ? ondansetron (ZOFRAN-ODT) 8 MG disintegrating tablet Take 1 tablet (8 mg total) by mouth every 8 (eight) hours as needed for nausea or vomiting. 20 tablet 0  ? Turmeric 500 MG TABS Take 1 tablet by mouth daily at 2 am.    ? ALPRAZolam (XANAX) 0.25 MG tablet TAKE 1 TABLET BY MOUTH AT BEDTIME AS NEEDED. 30 tablet 5  ? zolpidem (AMBIEN CR) 6.25 MG CR tablet TAKE 1 TABLET BY MOUTH AT BEDTIME AS NEEDED FOR SLEEP 30 tablet 5  ? ?No facility-administered medications prior to visit.  ? ? ?Review of Systems; ? ?Patient denies headache, fevers, malaise, unintentional weight loss, skin rash, eye pain, sinus congestion and sinus pain, sore throat, dysphagia,  hemoptysis , cough, dyspnea, wheezing, chest pain, palpitations, orthopnea, edema, abdominal pain, nausea, melena, diarrhea, constipation, flank pain, dysuria, hematuria, urinary  Frequency, nocturia, numbness, tingling, seizures,  Focal weakness, Loss of consciousness,  Tremor, insomnia, depression, anxiety, and suicidal ideation.   ? ? ? ?Objective:  ?BP 120/88 (BP Location: Left Arm, Patient Position: Sitting, Cuff Size: Normal)   Pulse 83   Temp 98.3 ?F (36.8 ?C) (Oral)   Ht '5\' 7"'  (1.702 m)   Wt 174 lb (78.9 kg)   LMP 05/05/2016 (Approximate)   SpO2 95%   BMI 27.25 kg/m?  ? ?BP Readings from Last 3 Encounters:  ?  05/25/21 120/88  ?02/05/21 130/86  ?08/05/20 102/68  ? ? ?Wt Readings from Last 3 Encounters:  ?05/25/21 174 lb (78.9 kg)  ?02/05/21 176 lb 12.8 oz (80.2 kg)  ?08/05/20 169 lb (76.7 kg)  ? ? ?General appearance: alert, cooperative and appears stated age ?Ears: normal TM's and external ear canals both ears ?Throat: lips, mucosa, and tongue normal; teeth and gums normal ?Neck: no adenopathy, no carotid bruit, supple, symmetrical, trachea midline and thyroid not enlarged, symmetric, no tenderness/mass/nodules ?Back: symmetric, no curvature. ROM normal. No CVA  tenderness. ?Lungs: clear to auscultation bilaterally ?Heart: regular rate and rhythm, S1, S2 normal, no murmur, click, rub or gallop ?Abdomen: soft, non-tender; bowel sounds normal; no masses,  no organomegaly ?Pulses: 2+ and symmetric ?Skin: Skin color, texture, turgor normal. No rashes or lesions ?Lymph nodes: Cervical, supraclavicular, and axillary nodes normal. ? ?No results found for: HGBA1C ? ?Lab Results  ?Component Value Date  ? CREATININE 0.77 02/05/2021  ? CREATININE 0.73 07/19/2019  ? CREATININE 0.83 10/22/2018  ? ? ?Lab Results  ?Component Value Date  ? WBC 7.5 10/22/2018  ? HGB 14.1 10/22/2018  ? HCT 42.7 10/22/2018  ? PLT 209 10/22/2018  ? GLUCOSE 102 (H) 02/05/2021  ? CHOL 180 02/05/2021  ? TRIG 58.0 02/05/2021  ? HDL 92.90 02/05/2021  ? LDLDIRECT 107.1 03/28/2013  ? [place] 75 02/05/2021  ? ALT 10 02/05/2021  ? AST 14 02/05/2021  ? NA 139 02/05/2021  ? K 4.5 02/05/2021  ? CL 102 02/05/2021  ? CREATININE 0.77 02/05/2021  ? BUN 18 02/05/2021  ? CO2 30 02/05/2021  ? TSH 1.00 02/05/2021  ? INR 1.0 10/22/2018  ? INR 1.0 10/22/2018  ? ? ?MM 3D SCREEN BREAST BILATERAL ? ?Result Date: 09/29/2020 ?CLINICAL DATA:  Screening. EXAM: DIGITAL SCREENING BILATERAL MAMMOGRAM WITH TOMOSYNTHESIS AND CAD TECHNIQUE: Bilateral screening digital craniocaudal and mediolateral oblique mammograms were obtained. Bilateral screening digital breast tomosynthesis was performed. The images were evaluated with computer-aided detection. COMPARISON:  Previous exam(s). ACR Breast Density Category b: There are scattered areas of fibroglandular density. FINDINGS: There are no findings suspicious for malignancy. IMPRESSION: No mammographic evidence of malignancy. A result letter of this screening mammogram will be mailed directly to the patient. RECOMMENDATION: Screening mammogram in one year. (Code:SM-B-01Y) BI-RADS CATEGORY  1: Negative. Electronically Signed   By: Everlean Alstrom M.D.   On: 09/29/2020 11:16 ? ? ?Assessment & Plan:   ? ?Problem List Items Addressed This Visit   ? ? Alkaline phosphatase elevation  ?  She has been reducing her alcohol intake for the last several months.  Repeat testing today  ?  ?  ? Anxiety, generalized  ?  Managed with celexa and prn alprazolam. Using ambien CR for  chronic insomnia  ?  ?  ? Relevant Medications  ? ALPRAZolam (XANAX) 0.25 MG tablet  ? Depression, major, single episode, mild ([place])  ?  Improved symptoms with increased dose of wellbutrin to 300 mg daily  Continue along with celexa 40 mg daily  No changes today  ?  ?  ? Relevant Medications  ? ALPRAZolam (XANAX) 0.25 MG tablet  ? Hepatic steatosis  ?   With persistent alk phos elevation .  reviewed alcohol intake; she has reduced intake.  Rechecking today ?  ?  ? Insomnia disorder  ?  Chronic, with no improvement in prior trials of over-the-counter first generation antihistamines.  Continue alternating ambien 5 mg  with alprazolam 0.25 mg . The risks and benefits  of benzodiazepine use were reviewed with patient today including excessive sedation leading to respiratory depression,  impaired thinking/driving, and addiction.  Patient was advised to avoid concurrent use with alcohol, to use medication only as needed and not to share with others  .   ?  ?  ? ?Other Visit Diagnoses   ? ? Encounter for screening mammogram for malignant neoplasm of breast    -  Primary  ? Relevant Orders  ? MM 3D SCREEN BREAST BILATERAL  ? Fatty liver      ? Relevant Orders  ? Comprehensive metabolic panel  ? ?  ? ? ?I spent 30 minutes dedicated to the care of this patient on the date of this encounter to include pre-visit review of patient's medical history,  most recent imaging studies, Face-to-face time with the patient , and post visit ordering of testing and therapeutics.   ? ?Follow-up: Return in about 6 months (around 11/25/2021). ? ? ?Crecencio Mc, MD ?

## 2021-05-25 NOTE — Assessment & Plan Note (Signed)
With persistent alk phos elevation .  reviewed alcohol intake; she has reduced intake.  Rechecking today ?

## 2021-05-25 NOTE — Assessment & Plan Note (Signed)
Improved symptoms with increased dose of wellbutrin to 300 mg daily  Continue along with celexa 40 mg daily  No changes today  ?

## 2021-05-25 NOTE — Assessment & Plan Note (Signed)
She has been reducing her alcohol intake for the last several months.  Repeat testing today  ?

## 2021-05-26 LAB — COMPREHENSIVE METABOLIC PANEL
ALT: 10 U/L (ref 0–35)
AST: 14 U/L (ref 0–37)
Albumin: 4.4 g/dL (ref 3.5–5.2)
Alkaline Phosphatase: 135 U/L — ABNORMAL HIGH (ref 39–117)
BUN: 23 mg/dL (ref 6–23)
CO2: 26 mEq/L (ref 19–32)
Calcium: 9.1 mg/dL (ref 8.4–10.5)
Chloride: 99 mEq/L (ref 96–112)
Creatinine, Ser: 0.74 mg/dL (ref 0.40–1.20)
GFR: 90.09 mL/min (ref >60.00)
Glucose, Bld: 87 mg/dL (ref 70–99)
Potassium: 3.8 mEq/L (ref 3.5–5.1)
Sodium: 136 mEq/L (ref 135–145)
Total Bilirubin: 0.3 mg/dL (ref 0.2–1.2)
Total Protein: 6.7 g/dL (ref 6.0–8.3)

## 2021-06-03 ENCOUNTER — Other Ambulatory Visit: Payer: Self-pay

## 2021-06-17 ENCOUNTER — Other Ambulatory Visit: Payer: Self-pay

## 2021-06-18 ENCOUNTER — Other Ambulatory Visit: Payer: Self-pay

## 2021-06-23 ENCOUNTER — Other Ambulatory Visit: Payer: Self-pay

## 2021-07-20 ENCOUNTER — Other Ambulatory Visit: Payer: Self-pay

## 2021-08-05 ENCOUNTER — Encounter: Payer: Self-pay | Admitting: Internal Medicine

## 2021-08-06 ENCOUNTER — Other Ambulatory Visit: Payer: Self-pay

## 2021-08-06 ENCOUNTER — Encounter: Payer: Self-pay | Admitting: Internal Medicine

## 2021-08-06 ENCOUNTER — Ambulatory Visit (INDEPENDENT_AMBULATORY_CARE_PROVIDER_SITE_OTHER): Payer: No Typology Code available for payment source | Admitting: Internal Medicine

## 2021-08-06 VITALS — BP 102/76 | HR 73 | Temp 97.9°F | Ht 67.0 in | Wt 171.0 lb

## 2021-08-06 DIAGNOSIS — Z79899 Other long term (current) drug therapy: Secondary | ICD-10-CM

## 2021-08-06 DIAGNOSIS — K760 Fatty (change of) liver, not elsewhere classified: Secondary | ICD-10-CM | POA: Diagnosis not present

## 2021-08-06 DIAGNOSIS — E782 Mixed hyperlipidemia: Secondary | ICD-10-CM | POA: Diagnosis not present

## 2021-08-06 DIAGNOSIS — R7301 Impaired fasting glucose: Secondary | ICD-10-CM

## 2021-08-06 DIAGNOSIS — R748 Abnormal levels of other serum enzymes: Secondary | ICD-10-CM

## 2021-08-06 DIAGNOSIS — F320 Major depressive disorder, single episode, mild: Secondary | ICD-10-CM

## 2021-08-06 LAB — IBC + FERRITIN
Ferritin: 219 ng/mL (ref 10.0–291.0)
Iron: 137 ug/dL (ref 42–145)
Saturation Ratios: 43.1 % (ref 20.0–50.0)
TIBC: 317.8 ug/dL (ref 250.0–450.0)
Transferrin: 227 mg/dL (ref 212.0–360.0)

## 2021-08-06 LAB — COMPREHENSIVE METABOLIC PANEL
ALT: 12 U/L (ref 0–35)
AST: 16 U/L (ref 0–37)
Albumin: 4.3 g/dL (ref 3.5–5.2)
Alkaline Phosphatase: 117 U/L (ref 39–117)
BUN: 17 mg/dL (ref 6–23)
CO2: 27 mEq/L (ref 19–32)
Calcium: 9.1 mg/dL (ref 8.4–10.5)
Chloride: 104 mEq/L (ref 96–112)
Creatinine, Ser: 0.73 mg/dL (ref 0.40–1.20)
GFR: 91.45 mL/min (ref >60.00)
Glucose, Bld: 92 mg/dL (ref 70–99)
Potassium: 4.5 mEq/L (ref 3.5–5.1)
Sodium: 140 mEq/L (ref 135–145)
Total Bilirubin: 0.6 mg/dL (ref 0.2–1.2)
Total Protein: 6.7 g/dL (ref 6.0–8.3)

## 2021-08-06 MED ORDER — ESTRADIOL 10 MCG VA TABS
ORAL_TABLET | VAGINAL | 0 refills | Status: DC
  Filled 2021-08-06: qty 16, 21d supply, fill #0
  Filled 2021-09-14: qty 16, 21d supply, fill #1

## 2021-08-06 NOTE — Progress Notes (Signed)
Subjective:  Patient ID: Paula Jensen, female    DOB: 1965-07-13  Age: 56 y.o. MRN: [id number]  CC: The primary encounter diagnosis was Moderate mixed hyperlipidemia not requiring statin therapy. Diagnoses of Fatty liver, Impaired fasting glucose, Long-term use of high-risk medication, Hepatic steatosis, Depression, major, single episode, mild ([place]), and Alkaline phosphatase elevation were also pertinent to this visit.   HPI Paula Jensen presents for  Chief Complaint  Patient presents with   Follow-up    6 month follow up    Depression/anxiety/insomnia: DOING well on current regimen.  Not using alprazolam regularly   Tinnitus left ear started 5 years ago  .  Started with yoga and diet change.  Sees chiropractor   Fatty liver:  "liver renew" product .  Mother has had elevated alk phos to 300 for years.  No liver biopsy..  patient does not donate blood .  Has reduced alcohol intake     Outpatient Medications Prior to Visit  Medication Sig Dispense Refill   ALPRAZolam (XANAX) 0.25 MG tablet TAKE 1 TABLET BY MOUTH AT BEDTIME AS NEEDED. 30 tablet 5   buPROPion (WELLBUTRIN XL) 300 MG 24 hr tablet Take 1 tablet (300 mg total) by mouth daily. 90 tablet 1   citalopram (CELEXA) 40 MG tablet TAKE 1 TABLET BY MOUTH DAILY. 90 tablet 3   clindamycin (CLEOCIN-T) 1 % external solution Apply topically 2 (two) times daily. 30 mL 0   doxycycline (ADOXA) 50 MG tablet Take 1 tablet (50 mg total) by mouth daily. 30 tablet 6   Magnesium 500 MG CAPS Take 2 capsules by mouth at bedtime.     ondansetron (ZOFRAN-ODT) 8 MG disintegrating tablet Take 1 tablet (8 mg total) by mouth every 8 (eight) hours as needed for nausea or vomiting. 20 tablet 0   zolpidem (AMBIEN CR) 6.25 MG CR tablet TAKE 1 TABLET BY MOUTH AT BEDTIME AS NEEDED FOR SLEEP 30 tablet 5   estradiol (ESTRACE) 0.1 MG/GM vaginal cream Insert 1 gram intravaginally at bedtime daily for 2 weeks,  Then reduce use to twice weekly  thereafter 42.5 g 12   Milk Thistle 1000 MG CAPS Take 1 capsule by mouth daily at 2 am. (Patient not taking: Reported on 08/06/2021)     Turmeric 500 MG TABS Take 1 tablet by mouth daily at 2 am. (Patient not taking: Reported on 08/06/2021)     No facility-administered medications prior to visit.    Review of Systems;  Patient denies headache, fevers, malaise, unintentional weight loss, skin rash, eye pain, sinus congestion and sinus pain, sore throat, dysphagia,  hemoptysis , cough, dyspnea, wheezing, chest pain, palpitations, orthopnea, edema, abdominal pain, nausea, melena, diarrhea, constipation, flank pain, dysuria, hematuria, urinary  Frequency, nocturia, numbness, tingling, seizures,  Focal weakness, Loss of consciousness,  Tremor, insomnia, depression, anxiety, and suicidal ideation.      Objective:  BP 102/76 (BP Location: Left Arm, Patient Position: Sitting, Cuff Size: Normal)   Pulse 73   Temp 97.9 F (36.6 C) (Oral)   Ht '5\' 7"'  (1.702 m)   Wt 171 lb (77.6 kg)   LMP 05/05/2016 (Approximate)   SpO2 98%   BMI 26.78 kg/m   BP Readings from Last 3 Encounters:  08/06/21 102/76  05/25/21 120/88  02/05/21 130/86    Wt Readings from Last 3 Encounters:  08/06/21 171 lb (77.6 kg)  05/25/21 174 lb (78.9 kg)  02/05/21 176 lb 12.8 oz (80.2 kg)    General appearance: alert, cooperative  and appears stated age Ears: normal TM's and external ear canals both ears Throat: lips, mucosa, and tongue normal; teeth and gums normal Neck: no adenopathy, no carotid bruit, supple, symmetrical, trachea midline and thyroid not enlarged, symmetric, no tenderness/mass/nodules Back: symmetric, no curvature. ROM normal. No CVA tenderness. Lungs: clear to auscultation bilaterally Heart: regular rate and rhythm, S1, S2 normal, no murmur, click, rub or gallop Abdomen: soft, non-tender; bowel sounds normal; no masses,  no organomegaly Pulses: 2+ and symmetric Skin: Skin color, texture, turgor normal.  No rashes or lesions Lymph nodes: Cervical, supraclavicular, and axillary nodes normal.  No results found for: HGBA1C  Lab Results  Component Value Date   CREATININE 0.73 08/06/2021   CREATININE 0.74 05/25/2021   CREATININE 0.77 02/05/2021    Lab Results  Component Value Date   WBC 7.5 10/22/2018   HGB 14.1 10/22/2018   HCT 42.7 10/22/2018   PLT 209 10/22/2018   GLUCOSE 92 08/06/2021   CHOL 180 02/05/2021   TRIG 58.0 02/05/2021   HDL 92.90 02/05/2021   LDLDIRECT 107.1 03/28/2013   LDLCALC 75 02/05/2021   ALT 12 08/06/2021   AST 16 08/06/2021   NA 140 08/06/2021   K 4.5 08/06/2021   CL 104 08/06/2021   CREATININE 0.73 08/06/2021   BUN 17 08/06/2021   CO2 27 08/06/2021   TSH 1.00 02/05/2021   INR 1.0 10/22/2018   INR 1.0 10/22/2018    MM 3D SCREEN BREAST BILATERAL  Result Date: 09/29/2020 CLINICAL DATA:  Screening. EXAM: DIGITAL SCREENING BILATERAL MAMMOGRAM WITH TOMOSYNTHESIS AND CAD TECHNIQUE: Bilateral screening digital craniocaudal and mediolateral oblique mammograms were obtained. Bilateral screening digital breast tomosynthesis was performed. The images were evaluated with computer-aided detection. COMPARISON:  Previous exam(s). ACR Breast Density Category b: There are scattered areas of fibroglandular density. FINDINGS: There are no findings suspicious for malignancy. IMPRESSION: No mammographic evidence of malignancy. A result letter of this screening mammogram will be mailed directly to the patient. RECOMMENDATION: Screening mammogram in one year. (Code:SM-B-01Y) BI-RADS CATEGORY  1: Negative. Electronically Signed   By: Everlean Alstrom M.D.   On: 09/29/2020 11:16   Assessment & Plan:   Problem List Items Addressed This Visit     Hyperlipidemia - Primary   Hepatic steatosis    Given family history of alevated alk phos  Will check ANA.  Iron studies are normal.    Lab Results  Component Value Date   IRON 137 08/06/2021   TIBC 317.8 08/06/2021   FERRITIN  219.0 08/06/2021   No results found for: ANA   Lab Results  Component Value Date   IRON 137 08/06/2021   TIBC 317.8 08/06/2021   FERRITIN 219.0 08/06/2021         Depression, major, single episode, mild ([facility])    Improved symptoms with increased dose of wellbutrin to 300 mg daily  Continue along with celexa 40 mg daily  No changes today        Alkaline phosphatase elevation    Checking ANA given family history of same.        Other Visit Diagnoses     Fatty liver       Relevant Orders   Comp Met (CMET) (Completed)   IBC + Ferritin (Completed)   ANA   Impaired fasting glucose       Long-term use of high-risk medication          Follow-up: No follow-ups on file.   Crecencio Mc, MD

## 2021-08-06 NOTE — Assessment & Plan Note (Addendum)
Given family history of alevated alk phos  Will check ANA.  Iron studies are normal.    Lab Results  Component Value Date   IRON 137 08/06/2021   TIBC 317.8 08/06/2021   FERRITIN 219.0 08/06/2021   No results found for: ANA   Lab Results  Component Value Date   IRON 137 08/06/2021   TIBC 317.8 08/06/2021   FERRITIN 219.0 08/06/2021

## 2021-08-08 NOTE — Assessment & Plan Note (Signed)
Improved symptoms with increased dose of wellbutrin to 300 mg daily  Continue along with celexa 40 mg daily  No changes today

## 2021-08-08 NOTE — Assessment & Plan Note (Signed)
Checking ANA given family history of same.

## 2021-08-09 ENCOUNTER — Other Ambulatory Visit: Payer: Self-pay

## 2021-08-09 LAB — ANA: Anti Nuclear Antibody (ANA): NEGATIVE

## 2021-08-13 ENCOUNTER — Other Ambulatory Visit: Payer: Self-pay

## 2021-08-20 ENCOUNTER — Other Ambulatory Visit: Payer: Self-pay

## 2021-09-01 ENCOUNTER — Other Ambulatory Visit: Payer: Self-pay

## 2021-09-01 ENCOUNTER — Other Ambulatory Visit: Payer: Self-pay | Admitting: Internal Medicine

## 2021-09-01 MED FILL — Bupropion HCl Tab ER 24HR 300 MG: ORAL | 90 days supply | Qty: 90 | Fill #0 | Status: AC

## 2021-09-14 ENCOUNTER — Other Ambulatory Visit: Payer: Self-pay

## 2021-09-14 ENCOUNTER — Other Ambulatory Visit: Payer: Self-pay | Admitting: Internal Medicine

## 2021-09-14 MED ORDER — CITALOPRAM HYDROBROMIDE 40 MG PO TABS
ORAL_TABLET | Freq: Every day | ORAL | 3 refills | Status: DC
  Filled 2021-09-14: qty 90, 90d supply, fill #0
  Filled 2021-12-15: qty 90, 90d supply, fill #1
  Filled 2022-03-09: qty 90, 90d supply, fill #2

## 2021-09-17 ENCOUNTER — Other Ambulatory Visit: Payer: Self-pay | Admitting: Internal Medicine

## 2021-09-17 ENCOUNTER — Other Ambulatory Visit: Payer: Self-pay

## 2021-09-17 MED FILL — Estradiol Vaginal Tab 10 MCG: VAGINAL | 56 days supply | Qty: 16 | Fill #0 | Status: AC

## 2021-09-27 ENCOUNTER — Ambulatory Visit
Admission: RE | Admit: 2021-09-27 | Discharge: 2021-09-27 | Disposition: A | Discharge status: Home / Self Care | Payer: No Typology Code available for payment source | Source: Ambulatory Visit | Attending: Internal Medicine | Admitting: Internal Medicine

## 2021-09-27 DIAGNOSIS — Z1231 Encounter for screening mammogram for malignant neoplasm of breast: Secondary | ICD-10-CM | POA: Insufficient documentation

## 2021-10-06 ENCOUNTER — Other Ambulatory Visit: Payer: Self-pay

## 2021-10-18 ENCOUNTER — Other Ambulatory Visit: Payer: Self-pay

## 2021-11-01 ENCOUNTER — Other Ambulatory Visit: Payer: Self-pay

## 2021-11-12 ENCOUNTER — Other Ambulatory Visit: Payer: Self-pay

## 2021-11-15 ENCOUNTER — Other Ambulatory Visit: Payer: Self-pay

## 2021-11-24 ENCOUNTER — Encounter: Payer: Self-pay | Admitting: Internal Medicine

## 2021-11-26 ENCOUNTER — Other Ambulatory Visit: Payer: Self-pay

## 2021-11-26 ENCOUNTER — Ambulatory Visit (INDEPENDENT_AMBULATORY_CARE_PROVIDER_SITE_OTHER): Payer: No Typology Code available for payment source | Admitting: Internal Medicine

## 2021-11-26 ENCOUNTER — Encounter: Payer: Self-pay | Admitting: Internal Medicine

## 2021-11-26 VITALS — BP 122/72 | HR 80 | Temp 98.1°F | Resp 16 | Ht 67.0 in | Wt 166.4 lb

## 2021-11-26 DIAGNOSIS — R7301 Impaired fasting glucose: Secondary | ICD-10-CM

## 2021-11-26 DIAGNOSIS — Z79899 Other long term (current) drug therapy: Secondary | ICD-10-CM | POA: Diagnosis not present

## 2021-11-26 DIAGNOSIS — Z0000 Encounter for general adult medical examination without abnormal findings: Secondary | ICD-10-CM | POA: Diagnosis not present

## 2021-11-26 DIAGNOSIS — E782 Mixed hyperlipidemia: Secondary | ICD-10-CM

## 2021-11-26 DIAGNOSIS — F320 Major depressive disorder, single episode, mild: Secondary | ICD-10-CM

## 2021-11-26 LAB — LIPID PANEL
Cholesterol: 183 mg/dL (ref 0–200)
HDL: 80.8 mg/dL (ref >39.00)
LDL Cholesterol: 85 mg/dL (ref 0–99)
NonHDL: 102.48
Total CHOL/HDL Ratio: 2
Triglycerides: 87 mg/dL (ref 0.0–149.0)
VLDL: 17.4 mg/dL (ref 0.0–40.0)

## 2021-11-26 LAB — CBC WITH DIFFERENTIAL/PLATELET
Basophils Absolute: 0 10*3/uL (ref 0.0–0.1)
Basophils Relative: 0.7 % (ref 0.0–3.0)
Eosinophils Absolute: 0.1 10*3/uL (ref 0.0–0.7)
Eosinophils Relative: 1.8 % (ref 0.0–5.0)
HCT: 41.3 % (ref 36.0–46.0)
Hemoglobin: 13.7 g/dL (ref 12.0–15.0)
Lymphocytes Relative: 33 % (ref 12.0–46.0)
Lymphs Abs: 1.8 10*3/uL (ref 0.7–4.0)
MCHC: 33.1 g/dL (ref 30.0–36.0)
MCV: 91.4 fl (ref 78.0–100.0)
Monocytes Absolute: 0.4 10*3/uL (ref 0.1–1.0)
Monocytes Relative: 7.8 % (ref 3.0–12.0)
Neutro Abs: 3.1 10*3/uL (ref 1.4–7.7)
Neutrophils Relative %: 56.7 % (ref 43.0–77.0)
Platelets: 225 10*3/uL (ref 150.0–400.0)
RBC: 4.53 Mil/uL (ref 3.87–5.11)
RDW: 13.5 % (ref 11.5–15.5)
WBC: 5.5 10*3/uL (ref 4.0–10.5)

## 2021-11-26 LAB — COMPREHENSIVE METABOLIC PANEL
ALT: 13 U/L (ref 0–35)
AST: 15 U/L (ref 0–37)
Albumin: 4.3 g/dL (ref 3.5–5.2)
Alkaline Phosphatase: 111 U/L (ref 39–117)
BUN: 14 mg/dL (ref 6–23)
CO2: 29 mEq/L (ref 19–32)
Calcium: 9 mg/dL (ref 8.4–10.5)
Chloride: 103 mEq/L (ref 96–112)
Creatinine, Ser: 0.72 mg/dL (ref 0.40–1.20)
GFR: 92.77 mL/min (ref >60.00)
Glucose, Bld: 81 mg/dL (ref 70–99)
Potassium: 4.3 mEq/L (ref 3.5–5.1)
Sodium: 140 mEq/L (ref 135–145)
Total Bilirubin: 0.4 mg/dL (ref 0.2–1.2)
Total Protein: 6.7 g/dL (ref 6.0–8.3)

## 2021-11-26 LAB — HEMOGLOBIN A1C: Hgb A1c MFr Bld: 5.6 % (ref 4.6–6.5)

## 2021-11-26 LAB — LDL CHOLESTEROL, DIRECT: Direct LDL: 83 mg/dL

## 2021-11-26 MED ORDER — ZOLPIDEM TARTRATE ER 6.25 MG PO TBCR
EXTENDED_RELEASE_TABLET | Freq: Every evening | ORAL | 5 refills | Status: DC | PRN
Start: 2021-11-26 — End: 2022-05-27
  Filled 2021-11-26: qty 30, fill #0
  Filled 2021-12-12: qty 30, 30d supply, fill #0
  Filled 2022-01-10: qty 30, 30d supply, fill #1
  Filled 2022-02-08: qty 30, 30d supply, fill #2
  Filled 2022-03-09: qty 30, 30d supply, fill #3
  Filled 2022-04-08: qty 30, 30d supply, fill #4
  Filled 2022-05-03 – 2022-05-04 (×2): qty 30, 30d supply, fill #5

## 2021-11-26 MED ORDER — ESTRADIOL 10 MCG VA TABS
ORAL_TABLET | VAGINAL | 3 refills | Status: DC
  Filled 2021-11-26: qty 24, 84d supply, fill #0
  Filled 2022-03-07: qty 24, 84d supply, fill #1

## 2021-11-26 NOTE — Progress Notes (Unsigned)
The patient is here for annual preventive examination and management of other chronic and acute problems.   The risk factors are reflected in the social history.   The roster of all physicians providing medical care to patient - is listed in the Snapshot section of the chart.   Activities of daily living:  The patient is 100% independent in all ADLs: dressing, toileting, feeding as well as independent mobility   Home safety : The patient has smoke detectors in the home. They wear seatbelts.  There are no unsecured firearms at home. There is no violence in the home.    There is no risks for hepatitis, STDs or HIV. There is no   history of blood transfusion. They have no travel history to infectious disease endemic areas of the world.   The patient has seen their dentist in the last six month. They have seen their eye doctor in the last year. The patinet  denies slight hearing difficulty with regard to whispered voices and some television programs.  [name]  They do not  have excessive sun exposure. Discussed the need for sun protection: hats, long sleeves and use of sunscreen if there is significant sun exposure.    Diet: the importance of a healthy diet is discussed. They do have a healthy diet.   The benefits of regular aerobic exercise were discussed. The patient  exercises  3 to 5 days per week  for  60 minutes.    Depression screen: there are no signs or vegative symptoms of depression- irritability, change in appetite, anhedonia, sadness/tearfullness.   The following portions of the patient's history were reviewed and updated as appropriate: allergies, current medications, past family history, past medical history,  past surgical history, past social history  and problem list.   Visual acuity was not assessed per patient preference since the patient has regular follow up with an  ophthalmologist. Hearing and body mass index were assessed and  reviewed.    During the course of the visit the patient was educated and counseled about appropriate screening and preventive services including : fall prevention , diabetes screening, nutrition counseling, colorectal cancer screening, and recommended immunizations.    Chief Complaint:  HPI     Annual Exam    Additional comments: Physical  Doing well      Last edited by Eli Phillips, [place] on 11/26/2021  9:58 AM.        Review of Symptoms  Patient denies headache, fevers, malaise, unintentional weight loss, skin rash, eye pain, sinus congestion and sinus pain, sore throat, dysphagia,  hemoptysis , cough, dyspnea, wheezing, chest pain, palpitations, orthopnea, edema, abdominal pain, nausea, melena, diarrhea, constipation, flank pain, dysuria, hematuria, urinary  Frequency, nocturia, numbness, tingling, seizures,  Focal weakness, Loss of consciousness,  Tremor, insomnia, depression, anxiety, and suicidal ideation.    Physical Exam:  BP 122/72   Pulse 80   Temp 98.1 F (36.7 C) (Oral)   Resp 16   Ht '5\' 7"'$  (1.702 m)   Wt 166 lb 6 oz (75.5 kg)   LMP 05/05/2016 (Approximate)   SpO2 98%   BMI 26.06 kg/m     General appearance: alert, cooperative and appears stated age Head: Normocephalic, without obvious abnormality, atraumatic Eyes: conjunctivae/corneas clear. PERRL, EOM's intact. Fundi benign. Ears: normal TM's and external ear canals both ears Nose: Nares normal. Septum midline. Mucosa normal. No drainage or sinus tenderness. Throat: lips, mucosa, and tongue normal; teeth and  gums normal Neck: no adenopathy, no carotid bruit, no JVD, supple, symmetrical, trachea midline and thyroid not enlarged, symmetric, no tenderness/mass/nodules Lungs: clear to auscultation bilaterally Breasts: normal appearance, no masses or tenderness Heart: regular rate and rhythm, S1, S2 normal, no murmur, click, rub or gallop Abdomen: soft, non-tender; bowel sounds normal; no masses,  no  organomegaly Extremities: extremities normal, atraumatic, no cyanosis or edema Pulses: 2+ and symmetric Skin: Skin color, texture, turgor normal. No rashes or lesions Neurologic: Alert and oriented X 3, normal strength and tone. Normal symmetric reflexes. Normal coordination and gait.     Assessment and Plan:  No problem-specific Assessment & Plan notes found for this encounter.   Updated Medication List Outpatient Encounter Medications as of 11/26/2021  Medication Sig   ALPRAZolam (XANAX) 0.25 MG tablet TAKE 1 TABLET BY MOUTH AT BEDTIME AS NEEDED.   buPROPion (WELLBUTRIN XL) 300 MG 24 hr tablet Take 1 tablet (300 mg total) by mouth daily.   citalopram (CELEXA) 40 MG tablet TAKE 1 TABLET BY MOUTH DAILY.   Estradiol (YUVAFEM) 10 MCG TABS vaginal tablet Inset tablet vaginally every night for 2 weeks,  then reduce use to twice weekly   Magnesium 500 MG CAPS Take 2 capsules by mouth at bedtime.   zolpidem (AMBIEN CR) 6.25 MG CR tablet TAKE 1 TABLET BY MOUTH AT BEDTIME AS NEEDED FOR SLEEP   ondansetron (ZOFRAN-ODT) 8 MG disintegrating tablet Take 1 tablet (8 mg total) by mouth every 8 (eight) hours as needed for nausea or vomiting. (Patient not taking: Reported on 11/24/2021)   [DISCONTINUED] clindamycin (CLEOCIN-T) 1 % external solution Apply topically 2 (two) times daily. (Patient not taking: Reported on 11/24/2021)   [DISCONTINUED] dicyclomine (BENTYL) 20 MG tablet Take 1 tablet (20 mg total) by mouth every 6 (six) hours.   [DISCONTINUED] doxycycline (ADOXA) 50 MG tablet Take 1 tablet (50 mg total) by mouth daily. (Patient not taking: Reported on 11/24/2021)   No facility-administered encounter medications on file as of 11/26/2021.

## 2021-11-28 NOTE — Assessment & Plan Note (Signed)
She notes continued Improved symptoms with increased dose of wellbutrin to 300 mg daily  Even with the children leaving home.   Continue along with celexa 40 mg daily  No changes today

## 2021-11-28 NOTE — Assessment & Plan Note (Signed)

## 2021-12-03 ENCOUNTER — Other Ambulatory Visit: Payer: Self-pay

## 2021-12-03 MED FILL — Bupropion HCl Tab ER 24HR 300 MG: ORAL | 3 days supply | Qty: 3 | Fill #1 | Status: AC

## 2021-12-03 MED FILL — Bupropion HCl Tab ER 24HR 300 MG: ORAL | 27 days supply | Qty: 27 | Fill #1 | Status: AC

## 2021-12-06 ENCOUNTER — Other Ambulatory Visit: Payer: Self-pay

## 2021-12-07 ENCOUNTER — Other Ambulatory Visit: Payer: Self-pay

## 2021-12-13 ENCOUNTER — Other Ambulatory Visit: Payer: Self-pay

## 2021-12-15 ENCOUNTER — Other Ambulatory Visit: Payer: Self-pay

## 2021-12-29 ENCOUNTER — Other Ambulatory Visit: Payer: Self-pay

## 2021-12-29 MED FILL — Bupropion HCl Tab ER 24HR 300 MG: ORAL | 30 days supply | Qty: 30 | Fill #2 | Status: AC

## 2022-01-10 ENCOUNTER — Other Ambulatory Visit: Payer: Self-pay

## 2022-02-03 ENCOUNTER — Other Ambulatory Visit: Payer: Self-pay

## 2022-02-03 MED FILL — Bupropion HCl Tab ER 24HR 300 MG: ORAL | 30 days supply | Qty: 30 | Fill #3 | Status: AC

## 2022-02-06 IMAGING — MR MR CERVICAL SPINE W/O CM
5 series · 39 of 48 positions shown · non-contrast
Comparison: Plain films October 08, 2019

CLINICAL DATA: Neck pain, chronic.

EXAM:
MRI CERVICAL SPINE WITHOUT CONTRAST
TECHNIQUE: Multiplanar, multisequence MR imaging of the cervical spine was
performed. No intravenous contrast was administered.

[Series 5: T2 · sagittal · 3.0mm · 0.62mm/px · 6 of 15 slices shown (1 of 2)]
[im 1/15]
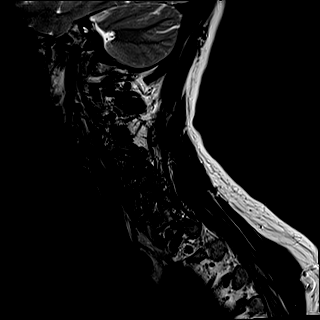
[im 3/15]
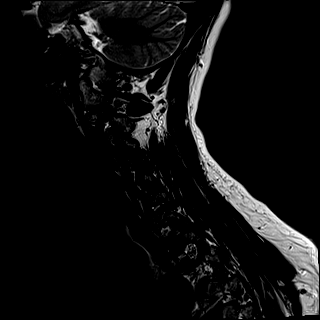
[im 6/15]
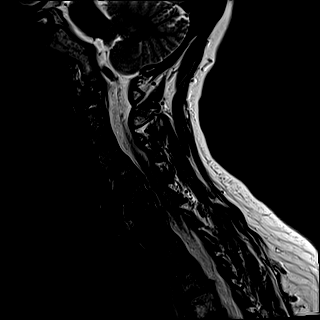
[im 9/15]
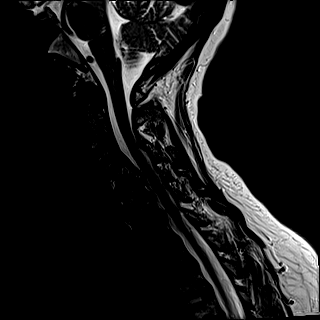
[im 12/15]
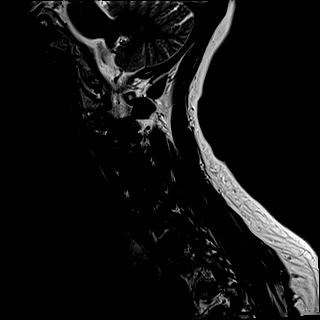
[im 15/15]
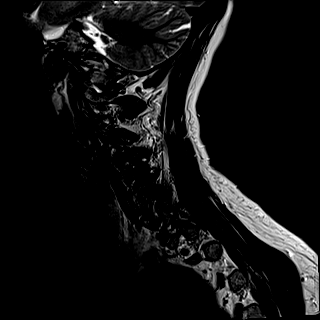

[Series 6: FLAIR · sagittal · 3.0mm · 0.78mm/px · 7 of 15 slices shown]
[im 1/15]
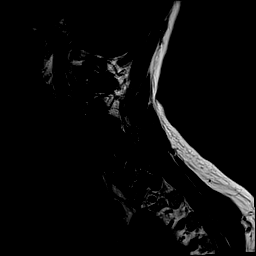
[im 3/15]
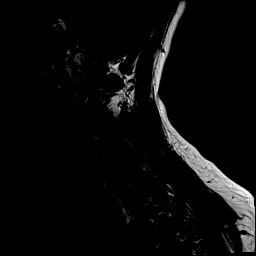
[im 5/15]
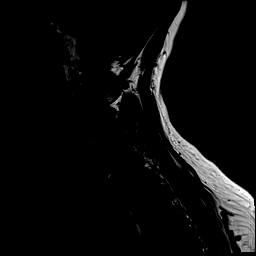
[im 8/15]
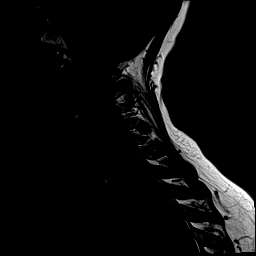
[im 10/15]
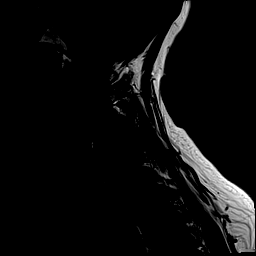
[im 12/15]
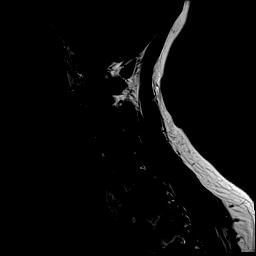
[im 15/15]
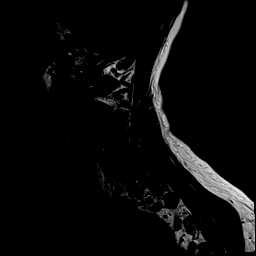

[Series 7: STIR · sagittal · 3.0mm · 0.62mm/px · 7 of 15 slices shown]
[im 1/15]
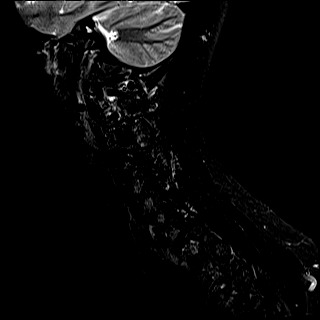
[im 3/15]
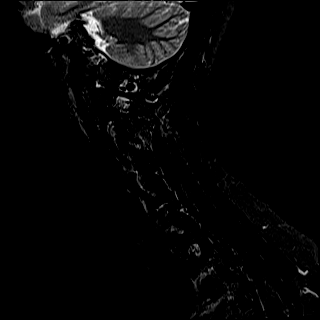
[im 5/15]
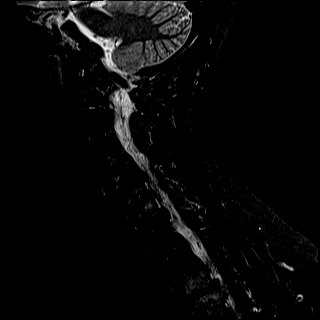
[im 8/15]
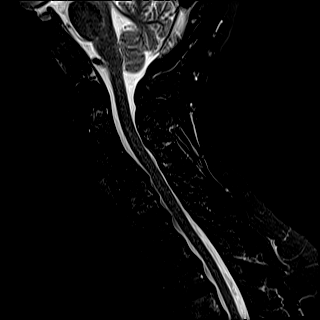
[im 10/15]
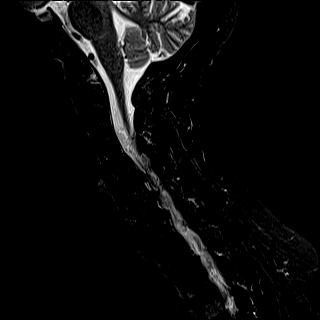
[im 12/15]
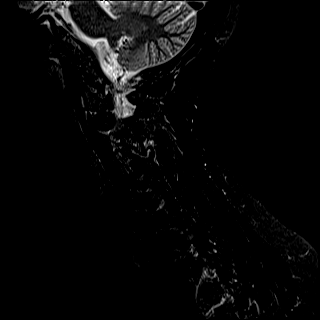
[im 15/15]
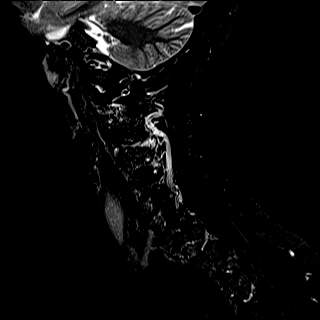

[Series 8: T2 · axial · 3.0mm · 0.70mm/px · z∈[-98,-13]mm · 11 of 29 slices shown (2 of 2)]
[im 1/29]
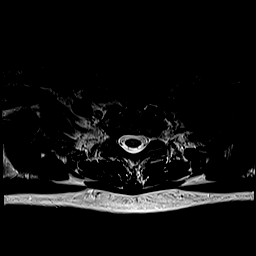
[im 3/29]
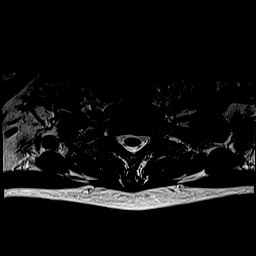
[im 5/29]
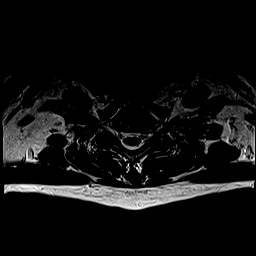
[im 7/29]
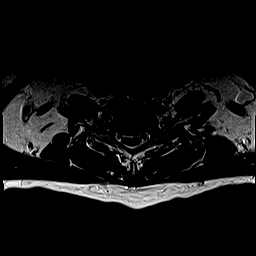
[im 9/29]
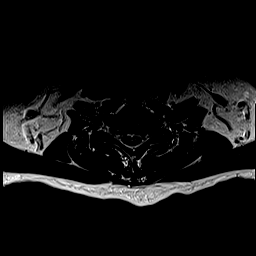
[im 11/29]
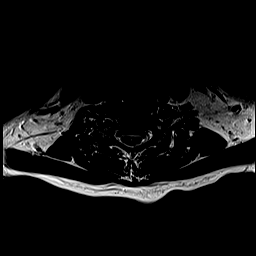
[im 13/29]
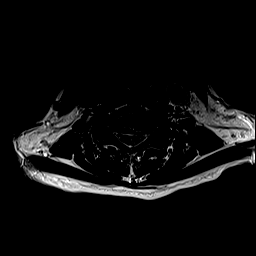
[im 16/29]
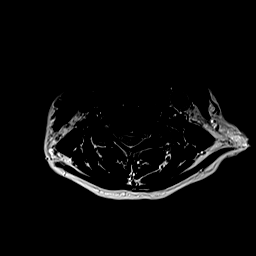
[im 20/29]
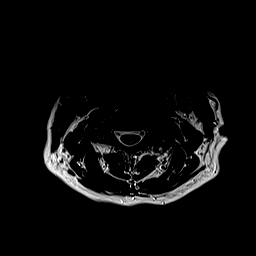
[im 24/29]
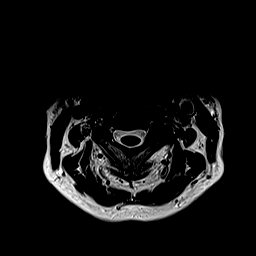
[im 29/29]
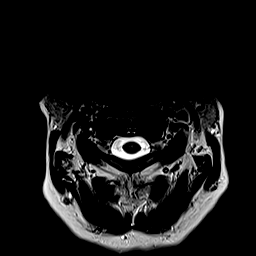

[Series 9: ax mpgr · axial · 3.0mm · 0.35mm/px · z∈[-98,-13]mm · 8 of 29 slices shown]
[im 1/29]
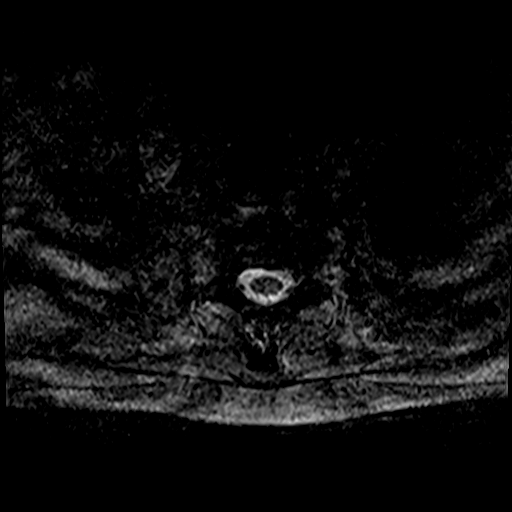
[im 5/29]
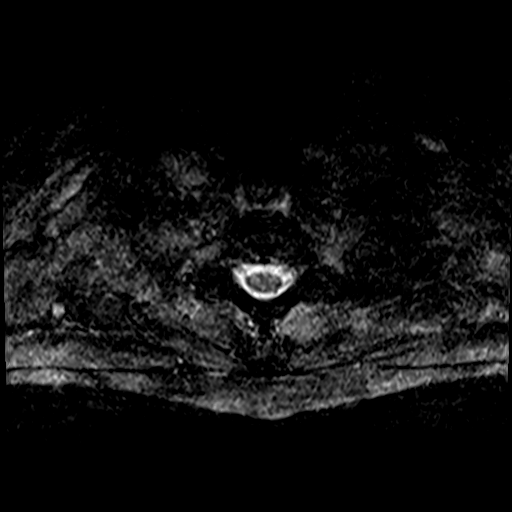
[im 9/29]
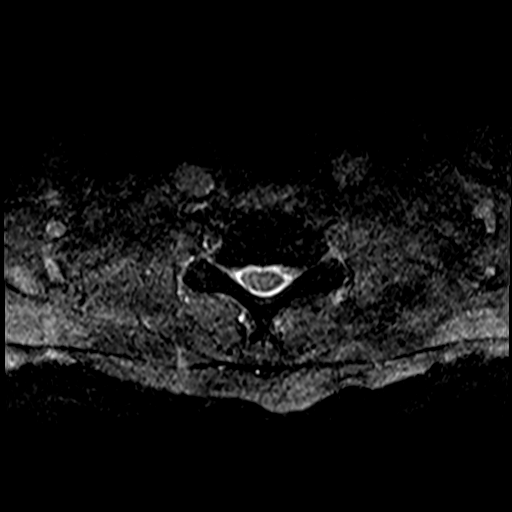
[im 13/29]
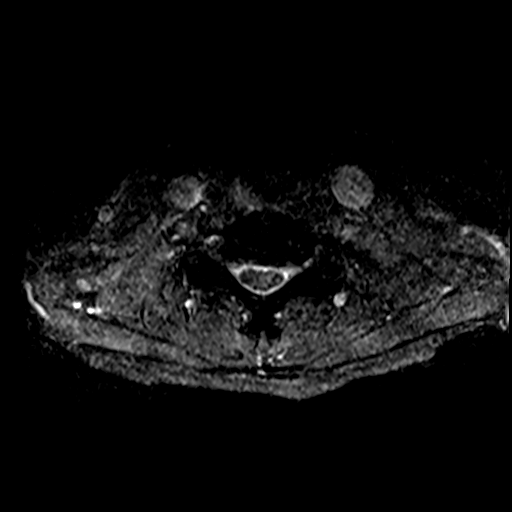
[im 16/29]
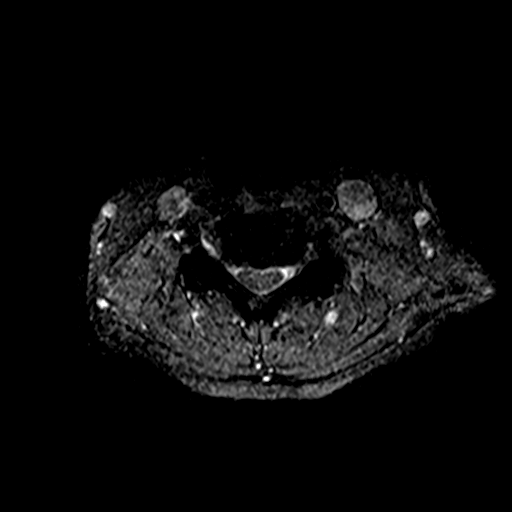
[im 20/29]
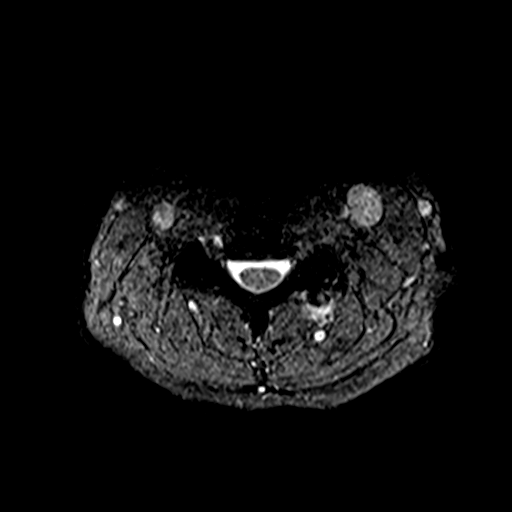
[im 24/29]
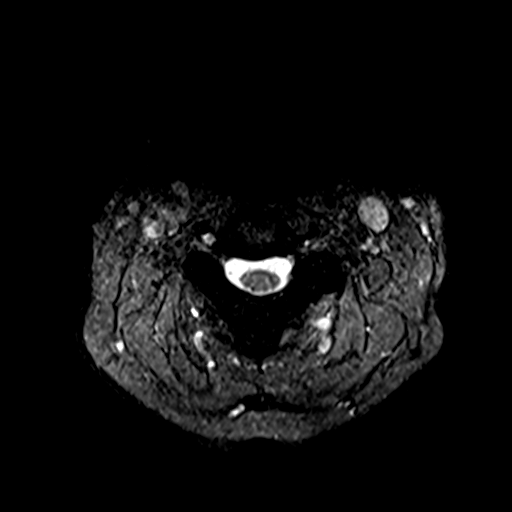
[im 29/29]
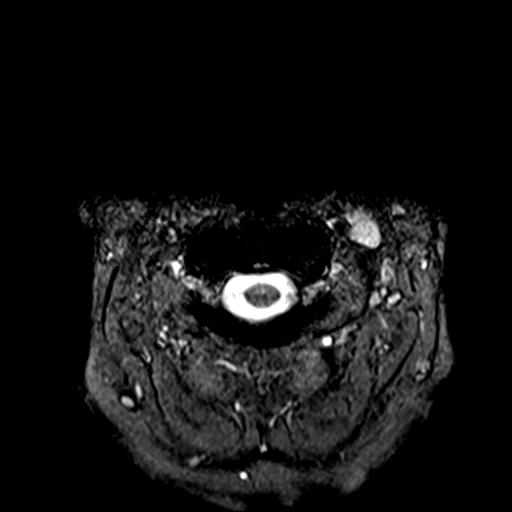

[39 of 48 positions shown; findings below may reference images not displayed]

FINDINGS: Alignment: Straightening of the cervical curvature. Small
anterolisthesis of C3 over C4 and C7 over T1.

Vertebrae: No fracture, evidence of discitis, or bone lesion.
Endplate degenerative changes at C4-5 and C5-6.

Cord: Normal signal and morphology.

Posterior Fossa, vertebral arteries, paraspinal tissues: Negative.
An 8 mm right thyroid lobe nodule.

Disc levels:

C2-3: Facet degenerative changes resulting in mild left neural
foraminal narrowing.

C3-4: Prominent facet degenerative changes and mild uncovertebral
spurring without significant spinal canal or neural foraminal
stenosis.

C4-5: Posterior disc osteophyte complex resulting in mild spinal
canal stenosis. Uncovertebral and facet degenerative changes result
severe right and moderate left neural foraminal narrowing.

C5-6: Posterior disc osteophyte complex resulting in mild spinal
canal stenosis. Uncovertebral and facet degenerative changes
resulting in mild bilateral neural foraminal narrowing.

C6-7: Posterior disc protrusion resulting mild spinal canal
stenosis. Uncovertebral and facet degenerative changes resulting in
severe bilateral neural foraminal narrowing.

C7-T1: Facet degenerative changes resulting in mild left neural
foraminal narrowing. No spinal canal stenosis.
IMPRESSION: 1. Multilevel cervical spondylosis as described above. There is mild
spinal canal stenosis at C4-5, C5-6 and C6-7.
2. Bilateral neural foraminal narrowing is severe at C4-5 and C6-7.

## 2022-02-09 ENCOUNTER — Other Ambulatory Visit: Payer: Self-pay

## 2022-03-07 ENCOUNTER — Encounter: Payer: Self-pay | Admitting: Internal Medicine

## 2022-03-07 ENCOUNTER — Other Ambulatory Visit: Payer: Self-pay

## 2022-03-07 ENCOUNTER — Other Ambulatory Visit: Payer: Self-pay | Admitting: Internal Medicine

## 2022-03-07 MED ORDER — BUPROPION HCL ER (XL) 300 MG PO TB24
300.0000 mg | ORAL_TABLET | Freq: Every day | ORAL | 1 refills | Status: DC
  Filled 2022-03-07: qty 90, 90d supply, fill #0

## 2022-03-09 ENCOUNTER — Other Ambulatory Visit: Payer: Self-pay

## 2022-03-09 MED ORDER — HYDROCORT-PRAMOXINE (PERIANAL) 1-1 % EX FOAM
1.0000 | Freq: Two times a day (BID) | CUTANEOUS | 1 refills | Status: DC
Start: 2022-03-09 — End: 2022-05-27
  Filled 2022-03-09: qty 10, 19d supply, fill #0

## 2022-03-10 ENCOUNTER — Other Ambulatory Visit: Payer: Self-pay

## 2022-03-20 ENCOUNTER — Other Ambulatory Visit: Payer: Self-pay

## 2022-04-08 ENCOUNTER — Other Ambulatory Visit: Payer: Self-pay

## 2022-05-03 ENCOUNTER — Other Ambulatory Visit: Payer: Self-pay

## 2022-05-04 ENCOUNTER — Other Ambulatory Visit: Payer: Self-pay

## 2022-05-27 ENCOUNTER — Ambulatory Visit (INDEPENDENT_AMBULATORY_CARE_PROVIDER_SITE_OTHER): Payer: 59 | Admitting: Internal Medicine

## 2022-05-27 ENCOUNTER — Other Ambulatory Visit: Payer: Self-pay | Admitting: Internal Medicine

## 2022-05-27 ENCOUNTER — Encounter: Payer: Self-pay | Admitting: Internal Medicine

## 2022-05-27 ENCOUNTER — Other Ambulatory Visit: Payer: Self-pay

## 2022-05-27 VITALS — BP 124/74 | HR 72 | Temp 98.1°F | Resp 15 | Ht 67.0 in | Wt 169.0 lb

## 2022-05-27 DIAGNOSIS — Z79899 Other long term (current) drug therapy: Secondary | ICD-10-CM | POA: Diagnosis not present

## 2022-05-27 DIAGNOSIS — L989 Disorder of the skin and subcutaneous tissue, unspecified: Secondary | ICD-10-CM | POA: Diagnosis not present

## 2022-05-27 DIAGNOSIS — Z1231 Encounter for screening mammogram for malignant neoplasm of breast: Secondary | ICD-10-CM | POA: Diagnosis not present

## 2022-05-27 DIAGNOSIS — E782 Mixed hyperlipidemia: Secondary | ICD-10-CM

## 2022-05-27 DIAGNOSIS — R7301 Impaired fasting glucose: Secondary | ICD-10-CM

## 2022-05-27 DIAGNOSIS — N644 Mastodynia: Secondary | ICD-10-CM | POA: Diagnosis not present

## 2022-05-27 DIAGNOSIS — F411 Generalized anxiety disorder: Secondary | ICD-10-CM | POA: Diagnosis not present

## 2022-05-27 MED ORDER — ZOLPIDEM TARTRATE ER 6.25 MG PO TBCR
6.2500 mg | EXTENDED_RELEASE_TABLET | Freq: Every evening | ORAL | 5 refills | Status: DC | PRN
  Filled 2022-05-27: qty 30, fill #0
  Filled 2022-06-02: qty 30, 30d supply, fill #0
  Filled 2022-06-28 – 2022-06-30 (×2): qty 30, 30d supply, fill #1
  Filled 2022-08-01: qty 30, 30d supply, fill #2
  Filled 2022-08-24 – 2022-08-29 (×2): qty 30, 30d supply, fill #3
  Filled 2022-09-27: qty 30, 30d supply, fill #4
  Filled 2022-10-31: qty 30, 30d supply, fill #5

## 2022-05-27 MED ORDER — TRIAMCINOLONE ACETONIDE 0.1 % EX CREA
1.0000 | TOPICAL_CREAM | Freq: Two times a day (BID) | CUTANEOUS | 0 refills | Status: DC
  Filled 2022-05-27: qty 80, 40d supply, fill #0

## 2022-05-27 MED ORDER — BUPROPION HCL ER (XL) 300 MG PO TB24
300.0000 mg | ORAL_TABLET | Freq: Every day | ORAL | 1 refills | Status: DC
  Filled 2022-05-27: qty 90, 90d supply, fill #0
  Filled 2022-09-07: qty 90, 90d supply, fill #1

## 2022-05-27 MED ORDER — CITALOPRAM HYDROBROMIDE 40 MG PO TABS
40.0000 mg | ORAL_TABLET | Freq: Every day | ORAL | 3 refills | Status: DC
  Filled 2022-05-27: qty 90, 90d supply, fill #0
  Filled 2022-08-24 (×2): qty 90, 90d supply, fill #1
  Filled 2022-11-30: qty 90, 90d supply, fill #2

## 2022-05-27 NOTE — Patient Instructions (Addendum)
I do NOT feel a breast mass.  Your pain was likely from putting too much pressure on a cyst,  or from a pectoralis sprain   If you decide you want a diagnostic mammogram,  just request it via [place].    For the insomnia:  I recommend a trial of Relaxium . It is available through [employer] and contains all natural supplements:  Melatonin 5 mg  Chamomile 25 mg Passionflower extract 75 mg GABA 100 mg Ashwaganda extract 125 mg Magnesium citrate, glycinate, oxide (100 mg)  L tryptophan 500 mg Valerest (proprietary  ingredient ; probably valeria root extract)

## 2022-05-27 NOTE — Progress Notes (Unsigned)
Subjective:  Patient ID: Paula Jensen, female    DOB: 1965-10-28  Age: 57 y.o. MRN: [id number]  CC: The primary encounter diagnosis was Encounter for screening mammogram for malignant neoplasm of breast. Diagnoses of Moderate mixed hyperlipidemia not requiring statin therapy, Impaired fasting glucose, and Long-term use of high-risk medication were also pertinent to this visit.   HPI Paula Jensen presents for follow up on fatty liver, insomnia and depression  Chief Complaint  Patient presents with   Medical Management of Chronic Issues   Hyperlipidemia   1) Breast pain : experienced during yoga a month ago   Outpatient Medications Prior to Visit  Medication Sig Dispense Refill   ALPRAZOLAM PO Take by mouth. PRN     ASHWAGANDHA PO Take by mouth.     buPROPion (WELLBUTRIN XL) 300 MG 24 hr tablet Take 1 tablet (300 mg total) by mouth daily. 90 tablet 1   citalopram (CELEXA) 40 MG tablet TAKE 1 TABLET BY MOUTH DAILY. 90 tablet 3   Estradiol (YUVAFEM) 10 MCG TABS vaginal tablet Insert into vagina  twice weekly 24 tablet 3   Liver Extract (LIVER PO) Take by mouth. Liver renew supplement     Magnesium 500 MG CAPS Take 2 capsules by mouth at bedtime.     zolpidem (AMBIEN CR) 6.25 MG CR tablet TAKE 1 TABLET BY MOUTH AT BEDTIME AS NEEDED FOR SLEEP 30 tablet 5   hydrocortisone-pramoxine (PROCTOFOAM-HC) rectal foam Place 1 applicator rectally 2 (two) times daily. 10 g 1   No facility-administered medications prior to visit.    Review of Systems;  Patient denies headache, fevers, malaise, unintentional weight loss, skin rash, eye pain, sinus congestion and sinus pain, sore throat, dysphagia,  hemoptysis , cough, dyspnea, wheezing, chest pain, palpitations, orthopnea, edema, abdominal pain, nausea, melena, diarrhea, constipation, flank pain, dysuria, hematuria, urinary  Frequency, nocturia, numbness, tingling, seizures,  Focal weakness, Loss of consciousness,  Tremor, insomnia,  depression, anxiety, and suicidal ideation.      Objective:  BP 124/74   Pulse 72   Temp 98.1 F (36.7 C) (Oral)   Resp 15   Ht '5\' 7"'$  (1.702 m)   Wt 169 lb (76.7 kg)   LMP 05/05/2016 (Approximate)   SpO2 99%   BMI 26.47 kg/m   BP Readings from Last 3 Encounters:  05/27/22 124/74  11/26/21 122/72  08/06/21 102/76    Wt Readings from Last 3 Encounters:  05/27/22 169 lb (76.7 kg)  11/26/21 166 lb 6 oz (75.5 kg)  08/06/21 171 lb (77.6 kg)    Physical Exam  Lab Results  Component Value Date   HGBA1C 5.6 11/26/2021    Lab Results  Component Value Date   CREATININE 0.72 11/26/2021   CREATININE 0.73 08/06/2021   CREATININE 0.74 05/25/2021    Lab Results  Component Value Date   WBC 5.5 11/26/2021   HGB 13.7 11/26/2021   HCT 41.3 11/26/2021   PLT 225.0 11/26/2021   GLUCOSE 81 11/26/2021   CHOL 183 11/26/2021   TRIG 87.0 11/26/2021   HDL 80.80 11/26/2021   LDLDIRECT 83.0 11/26/2021   LDLCALC 85 11/26/2021   ALT 13 11/26/2021   AST 15 11/26/2021   NA 140 11/26/2021   K 4.3 11/26/2021   CL 103 11/26/2021   CREATININE 0.72 11/26/2021   BUN 14 11/26/2021   CO2 29 11/26/2021   TSH 1.00 02/05/2021   INR 1.0 10/22/2018   INR 1.0 10/22/2018   HGBA1C 5.6 11/26/2021  MM 3D SCREEN BREAST BILATERAL  Result Date: 09/28/2021 CLINICAL DATA:  Screening. EXAM: DIGITAL SCREENING BILATERAL MAMMOGRAM WITH TOMOSYNTHESIS AND CAD TECHNIQUE: Bilateral screening digital craniocaudal and mediolateral oblique mammograms were obtained. Bilateral screening digital breast tomosynthesis was performed. The images were evaluated with computer-aided detection. COMPARISON:  Previous exam(s). ACR Breast Density Category b: There are scattered areas of fibroglandular density. FINDINGS: There are no findings suspicious for malignancy. IMPRESSION: No mammographic evidence of malignancy. A result letter of this screening mammogram will be mailed directly to the patient. RECOMMENDATION:  Screening mammogram in one year. (Code:SM-B-01Y) BI-RADS CATEGORY  1: Negative. Electronically Signed   By: Zerita Boers M.D.   On: 09/28/2021 15:02    Assessment & Plan:  .Encounter for screening mammogram for malignant neoplasm of breast  Moderate mixed hyperlipidemia not requiring statin therapy  Impaired fasting glucose  Long-term use of high-risk medication     I provided 30 minutes of face-to-face time during this encounter reviewing patient's last visit with me, patient's  most recent visit with cardiology,  nephrology,  and neurology,  recent surgical and non surgical procedures, previous  labs and imaging studies, counseling on currently addressed issues,  and post visit ordering to diagnostics and therapeutics .   Follow-up: No follow-ups on file.   Crecencio Mc, MD

## 2022-05-28 DIAGNOSIS — N644 Mastodynia: Secondary | ICD-10-CM | POA: Insufficient documentation

## 2022-05-28 NOTE — Assessment & Plan Note (Signed)
THE PAIN WAS TRANSIENT and triggered by lying on left breast during a yoga class.  Theree is no palpable mass on exam: she may have a cyst that was compressed during prone positioning.  Encouarged to continue self breast checks and notify me of a mass is perceived

## 2022-05-28 NOTE — Assessment & Plan Note (Signed)
Recently aggravated by husband's cardiac issues, now resolved

## 2022-06-02 ENCOUNTER — Other Ambulatory Visit: Payer: Self-pay

## 2022-06-16 ENCOUNTER — Other Ambulatory Visit: Payer: Self-pay | Admitting: Internal Medicine

## 2022-06-16 ENCOUNTER — Other Ambulatory Visit: Payer: Self-pay

## 2022-06-16 MED FILL — Alprazolam Tab 0.25 MG: ORAL | 30 days supply | Qty: 15 | Fill #0 | Status: AC

## 2022-06-16 NOTE — Telephone Encounter (Signed)
Looks like this is a historical medication.  Last OV: 05/27/2022 Next OV: 12/02/2022

## 2022-06-17 ENCOUNTER — Other Ambulatory Visit: Payer: Self-pay

## 2022-06-28 ENCOUNTER — Other Ambulatory Visit: Payer: Self-pay

## 2022-06-29 ENCOUNTER — Other Ambulatory Visit: Payer: Self-pay

## 2022-06-30 ENCOUNTER — Other Ambulatory Visit: Payer: Self-pay

## 2022-07-01 DIAGNOSIS — D225 Melanocytic nevi of trunk: Secondary | ICD-10-CM | POA: Diagnosis not present

## 2022-07-01 DIAGNOSIS — D2271 Melanocytic nevi of right lower limb, including hip: Secondary | ICD-10-CM | POA: Diagnosis not present

## 2022-07-01 DIAGNOSIS — L821 Other seborrheic keratosis: Secondary | ICD-10-CM | POA: Diagnosis not present

## 2022-07-01 DIAGNOSIS — D2272 Melanocytic nevi of left lower limb, including hip: Secondary | ICD-10-CM | POA: Diagnosis not present

## 2022-07-01 DIAGNOSIS — S61232A Puncture wound without foreign body of right middle finger without damage to nail, initial encounter: Secondary | ICD-10-CM | POA: Diagnosis not present

## 2022-07-01 DIAGNOSIS — D2261 Melanocytic nevi of right upper limb, including shoulder: Secondary | ICD-10-CM | POA: Diagnosis not present

## 2022-07-01 DIAGNOSIS — L814 Other melanin hyperpigmentation: Secondary | ICD-10-CM | POA: Diagnosis not present

## 2022-07-01 DIAGNOSIS — D2262 Melanocytic nevi of left upper limb, including shoulder: Secondary | ICD-10-CM | POA: Diagnosis not present

## 2022-07-01 DIAGNOSIS — D485 Neoplasm of uncertain behavior of skin: Secondary | ICD-10-CM | POA: Diagnosis not present

## 2022-07-11 ENCOUNTER — Other Ambulatory Visit: Payer: Self-pay | Admitting: Physician Assistant

## 2022-07-11 ENCOUNTER — Ambulatory Visit
Admission: RE | Admit: 2022-07-11 | Discharge: 2022-07-11 | Disposition: A | Discharge status: Home / Self Care | Payer: 59 | Source: Ambulatory Visit | Attending: Physician Assistant | Admitting: Physician Assistant

## 2022-07-11 ENCOUNTER — Ambulatory Visit
Admission: RE | Admit: 2022-07-11 | Discharge: 2022-07-11 | Disposition: A | Discharge status: Home / Self Care | Payer: 59 | Attending: Nurse Practitioner | Admitting: Nurse Practitioner

## 2022-07-11 DIAGNOSIS — D869 Sarcoidosis, unspecified: Secondary | ICD-10-CM

## 2022-07-12 ENCOUNTER — Encounter: Payer: Self-pay | Admitting: Internal Medicine

## 2022-07-12 DIAGNOSIS — D863 Sarcoidosis of skin: Secondary | ICD-10-CM

## 2022-07-19 DIAGNOSIS — D863 Sarcoidosis of skin: Secondary | ICD-10-CM | POA: Insufficient documentation

## 2022-08-01 ENCOUNTER — Other Ambulatory Visit: Payer: Self-pay

## 2022-08-01 MED FILL — Alprazolam Tab 0.25 MG: ORAL | 30 days supply | Qty: 15 | Fill #1 | Status: AC

## 2022-08-24 ENCOUNTER — Other Ambulatory Visit: Payer: Self-pay

## 2022-08-24 MED FILL — Alprazolam Tab 0.25 MG: ORAL | 30 days supply | Qty: 15 | Fill #2 | Status: CN

## 2022-08-25 ENCOUNTER — Other Ambulatory Visit: Payer: Self-pay

## 2022-08-29 ENCOUNTER — Other Ambulatory Visit: Payer: Self-pay

## 2022-08-29 MED FILL — Alprazolam Tab 0.25 MG: ORAL | 30 days supply | Qty: 15 | Fill #2 | Status: AC

## 2022-09-07 ENCOUNTER — Other Ambulatory Visit: Payer: Self-pay

## 2022-09-07 ENCOUNTER — Ambulatory Visit: Payer: 59 | Admitting: Internal Medicine

## 2022-09-28 ENCOUNTER — Other Ambulatory Visit: Payer: Self-pay

## 2022-09-28 MED FILL — Alprazolam Tab 0.25 MG: ORAL | 30 days supply | Qty: 15 | Fill #3 | Status: AC

## 2022-09-29 ENCOUNTER — Ambulatory Visit
Admission: RE | Admit: 2022-09-29 | Discharge: 2022-09-29 | Disposition: A | Discharge status: Home / Self Care | Payer: 59 | Source: Ambulatory Visit | Attending: Internal Medicine | Admitting: Internal Medicine

## 2022-09-29 DIAGNOSIS — Z1231 Encounter for screening mammogram for malignant neoplasm of breast: Secondary | ICD-10-CM | POA: Insufficient documentation

## 2022-09-30 ENCOUNTER — Other Ambulatory Visit: Payer: Self-pay

## 2022-09-30 ENCOUNTER — Other Ambulatory Visit (HOSPITAL_COMMUNITY): Payer: Self-pay

## 2022-09-30 DIAGNOSIS — L309 Dermatitis, unspecified: Secondary | ICD-10-CM | POA: Diagnosis not present

## 2022-09-30 MED ORDER — CLOBETASOL PROPIONATE 0.05 % EX CREA
1.0000 | TOPICAL_CREAM | Freq: Two times a day (BID) | CUTANEOUS | 1 refills | Status: DC
  Filled 2022-09-30: qty 60, 60d supply, fill #0
  Filled 2022-12-29: qty 60, 60d supply, fill #1

## 2022-10-13 DIAGNOSIS — M3501 Sicca syndrome with keratoconjunctivitis: Secondary | ICD-10-CM | POA: Diagnosis not present

## 2022-10-13 DIAGNOSIS — H538 Other visual disturbances: Secondary | ICD-10-CM | POA: Diagnosis not present

## 2022-10-13 DIAGNOSIS — H25013 Cortical age-related cataract, bilateral: Secondary | ICD-10-CM | POA: Diagnosis not present

## 2022-10-18 ENCOUNTER — Telehealth (INDEPENDENT_AMBULATORY_CARE_PROVIDER_SITE_OTHER): Payer: 59 | Admitting: Internal Medicine

## 2022-10-18 ENCOUNTER — Other Ambulatory Visit: Payer: Self-pay

## 2022-10-18 ENCOUNTER — Encounter: Payer: Self-pay | Admitting: Internal Medicine

## 2022-10-18 DIAGNOSIS — D863 Sarcoidosis of skin: Secondary | ICD-10-CM | POA: Diagnosis not present

## 2022-10-18 DIAGNOSIS — Z7184 Encounter for health counseling related to travel: Secondary | ICD-10-CM

## 2022-10-18 MED ORDER — AZITHROMYCIN 250 MG PO TABS
ORAL_TABLET | ORAL | 0 refills | Status: AC
  Filled 2022-10-18: qty 6, 5d supply, fill #0

## 2022-10-18 MED ORDER — ONDANSETRON HCL 4 MG PO TABS
4.0000 mg | ORAL_TABLET | Freq: Three times a day (TID) | ORAL | 0 refills | Status: AC | PRN
  Filled 2022-10-18: qty 20, 7d supply, fill #0

## 2022-10-18 MED ORDER — PREDNISONE 10 MG PO TABS
ORAL_TABLET | ORAL | 0 refills | Status: AC
  Filled 2022-10-18: qty 21, 6d supply, fill #0

## 2022-10-18 NOTE — Assessment & Plan Note (Signed)
Confirmed with second biopsy of lesion on back. She has no pulmonary symptoms so chest x ray is sufficient.  Has seen ophthalmology,  only cataracts diagnosed. Will refer to Rheum following serologic screen and cardiac screen with  EKG ordered

## 2022-10-18 NOTE — Assessment & Plan Note (Signed)
Patient is travelling to [place] for z week long vacation with family.  On prior trip she developed gastroenteritis with nausea and diarrhea on last day.  Sending off with prednisone taper, azithromycin and zofran

## 2022-10-18 NOTE — Progress Notes (Signed)
Virtual Visit via Caregility   Note   This format is felt to be most appropriate for this patient at this time.  All issues noted in this document were discussed and addressed.  No physical exam was performed (except for noted visual exam findings with Video Visits).   I connected with Paula Jensen on 10/18/22 at 11:00 AM EDT by a video enabled telemedicine application and verified that I am speaking with the correct person using two identifiers. Location patient: home Location provider: work or home office Persons participating in the virtual visit: patient, provider  I discussed the limitations, risks, security and privacy concerns of performing an evaluation and management service by telephone and the availability of in person appointments. I also discussed with the patient that there may be a patient responsible charge related to this service. The patient expressed understanding and agreed to proceed.  Reason for visit: discuss new dx of sarcoidosis by skin biopsy  HPI:   57 yr old recently diagnosed with sarcoidosis by skin biopsy of small lesion on forehead.  Following the biopsy the area became erythematous,  and several more lesions appeared on her back.  A second biopsy was done of a different lesion and confirmed sarcoidosis.  She was sent for chest x ray which was negative for hilar adenopathy and nodules.  She has no pulmonary symptoms.  She was also sent to Dr Druscilla Brownie for retinal evaluation.  Cataracts diagnosed and corrective surgery is planned  She has no history of polyuria, palpitations,  joint pain,  headaches, vertigo,  hearing loss   ROS: See pertinent positives and negatives per HPI.  Past Medical History:  Diagnosis Date   allergic rhinitis    Cancer (HCC)    skin   Family history of adverse reaction to anesthesia    Daughter - PONV   GERD (gastroesophageal reflux disease)    Wears contact lenses     Past Surgical History:  Procedure Laterality Date   COLONOSCOPY WITH  PROPOFOL N/A 08/23/2019   Procedure: COLONOSCOPY WITH PROPOFOL;  Surgeon: Midge Minium, MD;  Location: [facility];  Service: Endoscopy;  Laterality: N/A;  priority 4   DILATION AND CURETTAGE OF UTERUS  June 2012   [place] , for heavy bleeding    laparoscopy  1996   TONSILLECTOMY AND ADENOIDECTOMY      Family History  Problem Relation Age of Onset   Hypertension Mother    Atrial fibrillation Father    Cancer Neg Hx    Breast cancer Neg Hx     SOCIAL HX:  reports that she has quit smoking. Her smoking use included cigarettes. She has never used smokeless tobacco. She reports current alcohol use of about 10.0 standard drinks of alcohol per week. She reports that she does not use drugs.    Current Outpatient Medications:    ALPRAZOLAM PO, Take by mouth. PRN, Disp: , Rfl:    azithromycin (ZITHROMAX) 250 MG tablet, Take 2 tablets on day 1, then 1 tablet daily on days 2 through 5, Disp: 6 tablet, Rfl: 0   buPROPion (WELLBUTRIN XL) 300 MG 24 hr tablet, Take 1 tablet (300 mg total) by mouth daily., Disp: 90 tablet, Rfl: 1   cetirizine (ZYRTEC) 10 MG tablet, Take 10 mg by mouth daily., Disp: , Rfl:    citalopram (CELEXA) 40 MG tablet, Take 1 tablet (40 mg total) by mouth daily., Disp: 90 tablet, Rfl: 3   clobetasol cream (TEMOVATE) 0.05 %, Apply 1 Application topically 2 (two)  times daily to affected areas until resolved. Do NOT use on face., Disp: 60 g, Rfl: 1   Estradiol (YUVAFEM) 10 MCG TABS vaginal tablet, Insert into vagina  twice weekly, Disp: 24 tablet, Rfl: 3   Liver Extract (LIVER PO), Take by mouth. Liver renew supplement, Disp: , Rfl:    Magnesium 500 MG CAPS, Take 2 capsules by mouth at bedtime., Disp: , Rfl:    ondansetron (ZOFRAN) 4 MG tablet, Take 1 tablet (4 mg total) by mouth every 8 (eight) hours as needed for nausea or vomiting., Disp: 20 tablet, Rfl: 0   pimecrolimus (ELIDEL) 1 % cream, SMARTSIG:1 Topical Daily, Disp: , Rfl:    predniSONE (DELTASONE) 10 MG tablet, 6  tablets on Day 1 , then reduce by 1 tablet daily until gone, Disp: 21 tablet, Rfl: 0   zolpidem (AMBIEN CR) 6.25 MG CR tablet, Take 1 tablet (6.25 mg total) by mouth at bedtime as needed for sleep., Disp: 30 tablet, Rfl: 5   ALPRAZolam (XANAX) 0.25 MG tablet, Take 1 tablet (0.25 mg total) by mouth every other night as needed for insomnia., Disp: 15 tablet, Rfl: 5   ASHWAGANDHA PO, Take by mouth., Disp: , Rfl:    triamcinolone cream (KENALOG) 0.1 %, Apply 1 Application topically 2 (two) times daily as needed for itching (Patient not taking: Reported on 10/18/2022), Disp: 80 g, Rfl: 0  EXAM:  VITALS per patient if applicable:  GENERAL: alert, oriented, appears well and in no acute distress  HEENT: atraumatic, conjunttiva clear, no obvious abnormalities on inspection of external nose and ears  NECK: normal movements of the head and neck  LUNGS: on inspection no signs of respiratory distress, breathing rate appears normal, no obvious gross SOB, gasping or wheezing  CV: no obvious cyanosis  MS: moves all visible extremities without noticeable abnormality  PSYCH/NEURO: pleasant and cooperative, no obvious depression or anxiety, speech and thought processing grossly intact  ASSESSMENT AND PLAN: Sarcoidosis of skin Assessment & Plan: Confirmed with second biopsy of lesion on back. She has no pulmonary symptoms so chest x ray is sufficient.  Has seen ophthalmology,  only cataracts diagnosed. Will refer to Rheum following serologic screen and cardiac screen with  EKG ordered    Travel advice encounter Assessment & Plan: Patient is travelling to [facility] for z week long vacation with family.  On prior trip she developed gastroenteritis with nausea and diarrhea on last day.  Sending off with prednisone taper, azithromycin and zofran    Other orders -     Ondansetron HCl; Take 1 tablet (4 mg total) by mouth every 8 (eight) hours as needed for nausea or vomiting.  Dispense: 20 tablet; Refill:  0 -     predniSONE; 6 tablets on Day 1 , then reduce by 1 tablet daily until gone  Dispense: 21 tablet; Refill: 0 -     Azithromycin; Take 2 tablets on day 1, then 1 tablet daily on days 2 through 5  Dispense: 6 tablet; Refill: 0      I discussed the assessment and treatment plan with the patient. The patient was provided an opportunity to ask questions and all were answered. The patient agreed with the plan and demonstrated an understanding of the instructions.   The patient was advised to call back or seek an in-person evaluation if the symptoms worsen or if the condition fails to improve as anticipated.   I spent 30 minutes dedicated to the care of this patient on the date  of this encounter to include pre-visit review of his medical history,  Face-to-face time with the patient , and post visit ordering of testing and therapeutics.     EXAM:  VITALS per patient if applicable:  GENERAL: alert, oriented, appears well and in no acute distress  HEENT: atraumatic, conjunttiva clear, no obvious abnormalities on inspection of external nose and ears  NECK: normal movements of the head and neck  LUNGS: on inspection no signs of respiratory distress, breathing rate appears normal, no obvious gross SOB, gasping or wheezing  CV: no obvious cyanosis  MS: moves all visible extremities without noticeable abnormality  PSYCH/NEURO: pleasant and cooperative, no obvious depression or anxiety, speech and thought processing grossly intact  ASSESSMENT AND PLAN: Sarcoidosis of skin Assessment & Plan: Confirmed with second biopsy of lesion on back. She has no pulmonary symptoms so chest x ray is sufficient.  Has seen ophthalmology,  only cataracts diagnosed. Will refer to Rheum following serologic screen and cardiac screen with  EKG ordered    Travel advice encounter Assessment & Plan: Patient is travelling to [facility] for z week long vacation with family.  On prior trip she developed  gastroenteritis with nausea and diarrhea on last day.  Sending off with prednisone taper, azithromycin and zofran    Other orders -     Ondansetron HCl; Take 1 tablet (4 mg total) by mouth every 8 (eight) hours as needed for nausea or vomiting.  Dispense: 20 tablet; Refill: 0 -     predniSONE; 6 tablets on Day 1 , then reduce by 1 tablet daily until gone  Dispense: 21 tablet; Refill: 0 -     Azithromycin; Take 2 tablets on day 1, then 1 tablet daily on days 2 through 5  Dispense: 6 tablet; Refill: 0      I discussed the assessment and treatment plan with the patient. The patient was provided an opportunity to ask questions and all were answered. The patient agreed with the plan and demonstrated an understanding of the instructions.   The patient was advised to call back or seek an in-person evaluation if the symptoms worsen or if the condition fails to improve as anticipated.   I spent 30 minutes dedicated to the care of this patient on the date of this encounter to include pre-visit review of  Paula Jensen's medical history,  Face-to-face time with the patient , and post visit ordering of testing and therapeutics.    Paula Shams, MD

## 2022-10-19 ENCOUNTER — Encounter (INDEPENDENT_AMBULATORY_CARE_PROVIDER_SITE_OTHER): Payer: Self-pay

## 2022-10-31 ENCOUNTER — Other Ambulatory Visit: Payer: Self-pay

## 2022-11-03 ENCOUNTER — Ambulatory Visit (INDEPENDENT_AMBULATORY_CARE_PROVIDER_SITE_OTHER): Payer: 59

## 2022-11-03 ENCOUNTER — Other Ambulatory Visit: Payer: Self-pay

## 2022-11-03 ENCOUNTER — Encounter (INDEPENDENT_AMBULATORY_CARE_PROVIDER_SITE_OTHER): Payer: Self-pay

## 2022-11-03 ENCOUNTER — Encounter: Payer: Self-pay | Admitting: Internal Medicine

## 2022-11-03 DIAGNOSIS — D863 Sarcoidosis of skin: Secondary | ICD-10-CM | POA: Diagnosis not present

## 2022-11-03 LAB — CBC WITH DIFFERENTIAL/PLATELET
Basophils Absolute: 0 10*3/uL (ref 0.0–0.1)
Basophils Relative: 0.5 % (ref 0.0–3.0)
Eosinophils Absolute: 0.1 10*3/uL (ref 0.0–0.7)
Eosinophils Relative: 1.5 % (ref 0.0–5.0)
HCT: 41.1 % (ref 36.0–46.0)
Hemoglobin: 13.4 g/dL (ref 12.0–15.0)
Lymphocytes Relative: 34.4 % (ref 12.0–46.0)
Lymphs Abs: 1.7 10*3/uL (ref 0.7–4.0)
MCHC: 32.7 g/dL (ref 30.0–36.0)
MCV: 93 fl (ref 78.0–100.0)
Monocytes Absolute: 0.4 10*3/uL (ref 0.1–1.0)
Monocytes Relative: 8.5 % (ref 3.0–12.0)
Neutro Abs: 2.7 10*3/uL (ref 1.4–7.7)
Neutrophils Relative %: 55.1 % (ref 43.0–77.0)
Platelets: 227 10*3/uL (ref 150.0–400.0)
RBC: 4.41 Mil/uL (ref 3.87–5.11)
RDW: 13.5 % (ref 11.5–15.5)
WBC: 5 10*3/uL (ref 4.0–10.5)

## 2022-11-03 LAB — COMPREHENSIVE METABOLIC PANEL
ALT: 13 U/L (ref 0–35)
AST: 13 U/L (ref 0–37)
Albumin: 4.2 g/dL (ref 3.5–5.2)
Alkaline Phosphatase: 120 U/L — ABNORMAL HIGH (ref 39–117)
BUN: 19 mg/dL (ref 6–23)
CO2: 27 mEq/L (ref 19–32)
Calcium: 9.1 mg/dL (ref 8.4–10.5)
Chloride: 101 mEq/L (ref 96–112)
Creatinine, Ser: 0.78 mg/dL (ref 0.40–1.20)
GFR: 83.73 mL/min (ref >60.00)
Glucose, Bld: 102 mg/dL — ABNORMAL HIGH (ref 70–99)
Potassium: 4.2 mEq/L (ref 3.5–5.1)
Sodium: 136 mEq/L (ref 135–145)
Total Bilirubin: 0.4 mg/dL (ref 0.2–1.2)
Total Protein: 6.6 g/dL (ref 6.0–8.3)

## 2022-11-03 LAB — C-REACTIVE PROTEIN: CRP: <1 mg/dL (ref 0.5–20.0)

## 2022-11-03 NOTE — Progress Notes (Signed)
Pt came for nurse visit for EKG per Dr. Darrick Huntsman. Results viewed by Dr. Darrick Huntsman.

## 2022-11-04 LAB — ANA: Anti Nuclear Antibody (ANA): NEGATIVE

## 2022-11-04 LAB — THYROID PANEL WITH TSH
Free Thyroxine Index: 1.8 (ref 1.4–3.8)
T3 Uptake: 34 % (ref 22–35)
T4, Total: 5.2 ug/dL (ref 5.1–11.9)
TSH: 1.06 mIU/L (ref 0.40–4.50)

## 2022-11-04 LAB — ANGIOTENSIN CONVERTING ENZYME: Angiotensin-Converting Enzyme: 52 U/L (ref 9–67)

## 2022-11-06 NOTE — Addendum Note (Signed)
Addended by: Sherlene Shams on: 11/06/2022 04:06 PM   Modules accepted: Orders

## 2022-11-25 DIAGNOSIS — D863 Sarcoidosis of skin: Secondary | ICD-10-CM | POA: Diagnosis not present

## 2022-11-30 ENCOUNTER — Other Ambulatory Visit: Payer: Self-pay | Admitting: Internal Medicine

## 2022-11-30 ENCOUNTER — Other Ambulatory Visit: Payer: Self-pay

## 2022-12-01 ENCOUNTER — Other Ambulatory Visit: Payer: Self-pay

## 2022-12-01 MED ORDER — ZOLPIDEM TARTRATE ER 6.25 MG PO TBCR
6.2500 mg | EXTENDED_RELEASE_TABLET | Freq: Every evening | ORAL | 5 refills | Status: DC | PRN
  Filled 2022-12-01: qty 30, 30d supply, fill #0
  Filled 2022-12-29: qty 30, 30d supply, fill #1
  Filled 2023-01-26: qty 30, 30d supply, fill #2
  Filled 2023-02-26: qty 30, 30d supply, fill #3
  Filled 2023-03-29: qty 30, 30d supply, fill #4
  Filled 2023-04-28: qty 30, 30d supply, fill #5

## 2022-12-02 ENCOUNTER — Encounter: Payer: Self-pay | Admitting: Internal Medicine

## 2022-12-02 ENCOUNTER — Ambulatory Visit (INDEPENDENT_AMBULATORY_CARE_PROVIDER_SITE_OTHER): Payer: 59 | Admitting: Internal Medicine

## 2022-12-02 ENCOUNTER — Other Ambulatory Visit: Payer: Self-pay

## 2022-12-02 VITALS — BP 106/70 | HR 86 | Temp 98.0°F | Ht 67.0 in | Wt 172.6 lb

## 2022-12-02 DIAGNOSIS — E782 Mixed hyperlipidemia: Secondary | ICD-10-CM | POA: Diagnosis not present

## 2022-12-02 DIAGNOSIS — K760 Fatty (change of) liver, not elsewhere classified: Secondary | ICD-10-CM | POA: Diagnosis not present

## 2022-12-02 DIAGNOSIS — Z23 Encounter for immunization: Secondary | ICD-10-CM | POA: Diagnosis not present

## 2022-12-02 DIAGNOSIS — D863 Sarcoidosis of skin: Secondary | ICD-10-CM | POA: Diagnosis not present

## 2022-12-02 DIAGNOSIS — F411 Generalized anxiety disorder: Secondary | ICD-10-CM | POA: Diagnosis not present

## 2022-12-02 MED ORDER — CITALOPRAM HYDROBROMIDE 40 MG PO TABS
40.0000 mg | ORAL_TABLET | Freq: Every day | ORAL | 3 refills | Status: DC
  Filled 2023-03-23: qty 90, 90d supply, fill #0
  Filled 2023-06-30: qty 90, 90d supply, fill #1
  Filled 2023-10-06: qty 90, 90d supply, fill #2

## 2022-12-02 MED ORDER — BUPROPION HCL ER (XL) 300 MG PO TB24
300.0000 mg | ORAL_TABLET | Freq: Every day | ORAL | 1 refills | Status: DC
  Filled 2022-12-02: qty 90, 90d supply, fill #0
  Filled 2023-03-13: qty 90, 90d supply, fill #1

## 2022-12-02 MED ORDER — ALPRAZOLAM 0.25 MG PO TABS
0.5000 mg | ORAL_TABLET | Freq: Every evening | ORAL | 5 refills | Status: DC | PRN
Start: 2022-12-02 — End: 2023-06-02
  Filled 2022-12-02: qty 30, 15d supply, fill #0
  Filled 2023-01-26: qty 30, 15d supply, fill #1
  Filled 2023-03-29: qty 30, 15d supply, fill #2

## 2022-12-02 NOTE — Assessment & Plan Note (Signed)
Has not seen  rheumatologist  YET

## 2022-12-02 NOTE — Patient Instructions (Signed)
You can increase the alprazolam dose to 1.5 tablets or 2 tablets.  As needed.  Let me know if 2 tablets is what it takes so I can change the next refill strength

## 2022-12-02 NOTE — Progress Notes (Unsigned)
Subjective:  Patient ID: Paula Jensen, female    DOB: 09/01/1965  Age: 57 y.o. MRN: [id number]  CC: {There were no encounter diagnoses. (Refresh or delete this SmartLink)}   HPI Paula Jensen presents for  Chief Complaint  Patient presents with   Medical Management of Chronic Issues   1) follow up on MDD managed with wellbutrin and celexa   stil getting anxious 0.25 alprazolam not feeling it.    2) Insomnia: using alprazolam prn  3) fatty liver:  alk phos was elevated  after tripto [employer]  has reduced alcohol  4)    Outpatient Medications Prior to Visit  Medication Sig Dispense Refill   ALPRAZolam (XANAX) 0.25 MG tablet Take 1 tablet (0.25 mg total) by mouth every other night as needed for insomnia. 15 tablet 5   buPROPion (WELLBUTRIN XL) 300 MG 24 hr tablet Take 1 tablet (300 mg total) by mouth daily. 90 tablet 1   cetirizine (ZYRTEC) 10 MG tablet Take 10 mg by mouth daily.     citalopram (CELEXA) 40 MG tablet Take 1 tablet (40 mg total) by mouth daily. 90 tablet 3   clobetasol cream (TEMOVATE) 0.05 % Apply 1 Application topically 2 (two) times daily to affected areas until resolved. Do NOT use on face. 60 g 1   Estradiol (YUVAFEM) 10 MCG TABS vaginal tablet Insert into vagina  twice weekly 24 tablet 3   Liver Extract (LIVER PO) Take by mouth. Liver renew supplement     Magnesium 500 MG CAPS Take 2 capsules by mouth at bedtime.     ondansetron (ZOFRAN) 4 MG tablet Take 1 tablet (4 mg total) by mouth every 8 (eight) hours as needed for nausea or vomiting. 20 tablet 0   pimecrolimus (ELIDEL) 1 % cream SMARTSIG:1 Topical Daily     zolpidem (AMBIEN CR) 6.25 MG CR tablet Take 1 tablet (6.25 mg total) by mouth at bedtime as needed for sleep. 30 tablet 5   ALPRAZOLAM PO Take by mouth. PRN     ASHWAGANDHA PO Take by mouth.     triamcinolone cream (KENALOG) 0.1 % Apply 1 Application topically 2 (two) times daily as needed for itching 80 g 0   No facility-administered  medications prior to visit.    Review of Systems;  Patient denies headache, fevers, malaise, unintentional weight loss, skin rash, eye pain, sinus congestion and sinus pain, sore throat, dysphagia,  hemoptysis , cough, dyspnea, wheezing, chest pain, palpitations, orthopnea, edema, abdominal pain, nausea, melena, diarrhea, constipation, flank pain, dysuria, hematuria, urinary  Frequency, nocturia, numbness, tingling, seizures,  Focal weakness, Loss of consciousness,  Tremor, insomnia, depression, anxiety, and suicidal ideation.      Objective:  BP 106/70   Pulse 86   Temp 98 F (36.7 C) (Oral)   Ht 5\' 7"  (1.702 m)   Wt 172 lb 9.6 oz (78.3 kg)   LMP 05/05/2016 (Approximate)   SpO2 97%   BMI 27.03 kg/m   BP Readings from Last 3 Encounters:  12/02/22 106/70  05/27/22 124/74  11/26/21 122/72    Wt Readings from Last 3 Encounters:  12/02/22 172 lb 9.6 oz (78.3 kg)  05/27/22 169 lb (76.7 kg)  11/26/21 166 lb 6 oz (75.5 kg)    Physical Exam  Lab Results  Component Value Date   HGBA1C 5.6 11/26/2021    Lab Results  Component Value Date   CREATININE 0.78 11/03/2022   CREATININE 0.72 11/26/2021   CREATININE 0.73 08/06/2021  Lab Results  Component Value Date   WBC 5.0 11/03/2022   HGB 13.4 11/03/2022   HCT 41.1 11/03/2022   PLT 227.0 11/03/2022   GLUCOSE 102 (H) 11/03/2022   CHOL 183 11/26/2021   TRIG 87.0 11/26/2021   HDL 80.80 11/26/2021   LDLDIRECT 83.0 11/26/2021   LDLCALC 85 11/26/2021   ALT 13 11/03/2022   AST 13 11/03/2022   NA 136 11/03/2022   K 4.2 11/03/2022   CL 101 11/03/2022   CREATININE 0.78 11/03/2022   BUN 19 11/03/2022   CO2 27 11/03/2022   TSH 1.06 11/03/2022   INR 1.0 10/22/2018   INR 1.0 10/22/2018   HGBA1C 5.6 11/26/2021    MM 3D SCREENING MAMMOGRAM BILATERAL BREAST  Result Date: 09/30/2022 CLINICAL DATA:  Screening. EXAM: DIGITAL SCREENING BILATERAL MAMMOGRAM WITH TOMOSYNTHESIS AND CAD TECHNIQUE: Bilateral screening digital  craniocaudal and mediolateral oblique mammograms were obtained. Bilateral screening digital breast tomosynthesis was performed. The images were evaluated with computer-aided detection. COMPARISON:  Previous exam(s). ACR Breast Density Category b: There are scattered areas of fibroglandular density. FINDINGS: There are no findings suspicious for malignancy. IMPRESSION: No mammographic evidence of malignancy. A result letter of this screening mammogram will be mailed directly to the patient. RECOMMENDATION: Screening mammogram in one year. (Code:SM-B-01Y) BI-RADS CATEGORY  1: Negative. Electronically Signed   By: Hulan Saas M.D.   On: 09/30/2022 15:16    Assessment & Plan:  .There are no diagnoses linked to this encounter.   I provided 30 minutes of face-to-face time during this encounter reviewing patient's last visit with me, patient's  most recent visit with cardiology,  nephrology,  and neurology,  recent surgical and non surgical procedures, previous  labs and imaging studies, counseling on currently addressed issues,  and post visit ordering to diagnostics and therapeutics .   Follow-up: No follow-ups on file.   Sherlene Shams, MD

## 2022-12-04 NOTE — Assessment & Plan Note (Signed)
Excellent panel in April 2021 after losing weight.  Repeat needed  Lab Results  Component Value Date   CHOL 183 11/26/2021   HDL 80.80 11/26/2021   LDLCALC 85 11/26/2021   LDLDIRECT 83.0 11/26/2021   TRIG 87.0 11/26/2021   CHOLHDL 2 11/26/2021

## 2022-12-04 NOTE — Assessment & Plan Note (Signed)
Normal ANA   Iron studies are normal.    Lab Results  Component Value Date   IRON 137 08/06/2021   TIBC 317.8 08/06/2021   FERRITIN 219.0 08/06/2021   Lab Results  Component Value Date   ANA NEGATIVE 11/03/2022     Lab Results  Component Value Date   IRON 137 08/06/2021   TIBC 317.8 08/06/2021   FERRITIN 219.0 08/06/2021

## 2022-12-04 NOTE — Assessment & Plan Note (Signed)
Recently aggravated by husband's cardiac issues,  occasional need for alprazolam but at higher dose .  Will allow refill at higher quantity to allow use of 1.5 tablets

## 2022-12-08 ENCOUNTER — Encounter: Payer: Self-pay | Admitting: Internal Medicine

## 2022-12-15 DIAGNOSIS — H25013 Cortical age-related cataract, bilateral: Secondary | ICD-10-CM | POA: Diagnosis not present

## 2022-12-29 ENCOUNTER — Other Ambulatory Visit: Payer: Self-pay

## 2023-01-26 ENCOUNTER — Other Ambulatory Visit: Payer: Self-pay

## 2023-02-03 ENCOUNTER — Other Ambulatory Visit: Payer: Self-pay

## 2023-02-03 DIAGNOSIS — D863 Sarcoidosis of skin: Secondary | ICD-10-CM | POA: Diagnosis not present

## 2023-02-03 DIAGNOSIS — M19041 Primary osteoarthritis, right hand: Secondary | ICD-10-CM | POA: Diagnosis not present

## 2023-02-03 DIAGNOSIS — M19042 Primary osteoarthritis, left hand: Secondary | ICD-10-CM | POA: Diagnosis not present

## 2023-02-03 DIAGNOSIS — Z7960 Long term (current) use of unspecified immunomodulators and immunosuppressants: Secondary | ICD-10-CM | POA: Diagnosis not present

## 2023-02-03 DIAGNOSIS — M79672 Pain in left foot: Secondary | ICD-10-CM | POA: Diagnosis not present

## 2023-02-03 DIAGNOSIS — M79671 Pain in right foot: Secondary | ICD-10-CM | POA: Diagnosis not present

## 2023-02-03 MED ORDER — METHOTREXATE SODIUM 2.5 MG PO TABS
12.5000 mg | ORAL_TABLET | ORAL | 2 refills | Status: DC
  Filled 2023-02-03: qty 20, 28d supply, fill #0
  Filled 2023-02-28: qty 20, 28d supply, fill #1
  Filled 2023-03-29: qty 20, 28d supply, fill #2

## 2023-02-03 MED ORDER — FOLIC ACID 1 MG PO TABS
1.0000 mg | ORAL_TABLET | Freq: Every day | ORAL | 3 refills | Status: DC
  Filled 2023-02-03: qty 90, 90d supply, fill #0
  Filled 2023-05-09: qty 90, 90d supply, fill #1
  Filled 2023-08-11: qty 90, 90d supply, fill #2
  Filled 2023-11-16: qty 90, 90d supply, fill #3

## 2023-02-03 MED ORDER — HYDROXYCHLOROQUINE SULFATE 200 MG PO TABS
200.0000 mg | ORAL_TABLET | Freq: Two times a day (BID) | ORAL | 5 refills | Status: DC
  Filled 2023-02-03: qty 60, 30d supply, fill #0
  Filled 2023-02-28: qty 60, 30d supply, fill #1
  Filled 2023-04-04: qty 60, 30d supply, fill #2
  Filled 2023-05-09: qty 60, 30d supply, fill #3
  Filled 2023-06-13: qty 60, 30d supply, fill #4
  Filled 2023-07-18: qty 60, 30d supply, fill #5

## 2023-02-27 ENCOUNTER — Other Ambulatory Visit: Payer: Self-pay

## 2023-03-13 ENCOUNTER — Other Ambulatory Visit: Payer: Self-pay

## 2023-03-23 ENCOUNTER — Other Ambulatory Visit: Payer: Self-pay

## 2023-03-24 DIAGNOSIS — F1024 Alcohol dependence with alcohol-induced mood disorder: Secondary | ICD-10-CM | POA: Diagnosis not present

## 2023-03-24 DIAGNOSIS — Z7960 Long term (current) use of unspecified immunomodulators and immunosuppressants: Secondary | ICD-10-CM | POA: Diagnosis not present

## 2023-03-24 DIAGNOSIS — M19041 Primary osteoarthritis, right hand: Secondary | ICD-10-CM | POA: Diagnosis not present

## 2023-03-24 DIAGNOSIS — F4323 Adjustment disorder with mixed anxiety and depressed mood: Secondary | ICD-10-CM | POA: Diagnosis not present

## 2023-03-24 DIAGNOSIS — F1011 Alcohol abuse, in remission: Secondary | ICD-10-CM | POA: Diagnosis not present

## 2023-03-24 DIAGNOSIS — M19042 Primary osteoarthritis, left hand: Secondary | ICD-10-CM | POA: Diagnosis not present

## 2023-03-24 DIAGNOSIS — D863 Sarcoidosis of skin: Secondary | ICD-10-CM | POA: Diagnosis not present

## 2023-03-29 ENCOUNTER — Other Ambulatory Visit: Payer: Self-pay

## 2023-04-28 ENCOUNTER — Other Ambulatory Visit: Payer: Self-pay

## 2023-05-01 ENCOUNTER — Other Ambulatory Visit: Payer: Self-pay

## 2023-05-02 ENCOUNTER — Other Ambulatory Visit: Payer: Self-pay

## 2023-05-02 MED ORDER — METHOTREXATE SODIUM 2.5 MG PO TABS
12.5000 mg | ORAL_TABLET | ORAL | 1 refills | Status: DC
  Filled 2023-05-02: qty 60, 84d supply, fill #0
  Filled 2023-08-11: qty 60, 84d supply, fill #1

## 2023-05-05 DIAGNOSIS — F1024 Alcohol dependence with alcohol-induced mood disorder: Secondary | ICD-10-CM | POA: Diagnosis not present

## 2023-05-05 DIAGNOSIS — F1011 Alcohol abuse, in remission: Secondary | ICD-10-CM | POA: Diagnosis not present

## 2023-05-05 DIAGNOSIS — F4323 Adjustment disorder with mixed anxiety and depressed mood: Secondary | ICD-10-CM | POA: Diagnosis not present

## 2023-05-26 DIAGNOSIS — F1024 Alcohol dependence with alcohol-induced mood disorder: Secondary | ICD-10-CM | POA: Diagnosis not present

## 2023-05-26 DIAGNOSIS — F4323 Adjustment disorder with mixed anxiety and depressed mood: Secondary | ICD-10-CM | POA: Diagnosis not present

## 2023-05-26 DIAGNOSIS — Z7960 Long term (current) use of unspecified immunomodulators and immunosuppressants: Secondary | ICD-10-CM | POA: Diagnosis not present

## 2023-05-26 DIAGNOSIS — M19042 Primary osteoarthritis, left hand: Secondary | ICD-10-CM | POA: Diagnosis not present

## 2023-05-26 DIAGNOSIS — D863 Sarcoidosis of skin: Secondary | ICD-10-CM | POA: Diagnosis not present

## 2023-05-26 DIAGNOSIS — F1011 Alcohol abuse, in remission: Secondary | ICD-10-CM | POA: Diagnosis not present

## 2023-05-26 DIAGNOSIS — M19041 Primary osteoarthritis, right hand: Secondary | ICD-10-CM | POA: Diagnosis not present

## 2023-06-02 ENCOUNTER — Other Ambulatory Visit: Payer: Self-pay

## 2023-06-02 ENCOUNTER — Ambulatory Visit: Payer: 59 | Admitting: Internal Medicine

## 2023-06-02 VITALS — BP 122/78 | HR 67 | Ht 67.0 in | Wt 163.0 lb

## 2023-06-02 DIAGNOSIS — E782 Mixed hyperlipidemia: Secondary | ICD-10-CM | POA: Diagnosis not present

## 2023-06-02 DIAGNOSIS — E663 Overweight: Secondary | ICD-10-CM | POA: Diagnosis not present

## 2023-06-02 DIAGNOSIS — Z1231 Encounter for screening mammogram for malignant neoplasm of breast: Secondary | ICD-10-CM

## 2023-06-02 DIAGNOSIS — Z79899 Other long term (current) drug therapy: Secondary | ICD-10-CM | POA: Diagnosis not present

## 2023-06-02 DIAGNOSIS — R7301 Impaired fasting glucose: Secondary | ICD-10-CM | POA: Diagnosis not present

## 2023-06-02 DIAGNOSIS — F5101 Primary insomnia: Secondary | ICD-10-CM | POA: Diagnosis not present

## 2023-06-02 DIAGNOSIS — D863 Sarcoidosis of skin: Secondary | ICD-10-CM | POA: Diagnosis not present

## 2023-06-02 LAB — HEMOGLOBIN A1C: Hgb A1c MFr Bld: 5.9 % (ref 4.6–6.5)

## 2023-06-02 LAB — LIPID PANEL
Cholesterol: 152 mg/dL (ref 0–200)
HDL: 55.7 mg/dL (ref >39.00)
LDL Cholesterol: 84 mg/dL (ref 0–99)
NonHDL: 96.74
Total CHOL/HDL Ratio: 3
Triglycerides: 64 mg/dL (ref 0.0–149.0)
VLDL: 12.8 mg/dL (ref 0.0–40.0)

## 2023-06-02 LAB — TSH: TSH: 0.99 u[IU]/mL (ref 0.35–5.50)

## 2023-06-02 LAB — LDL CHOLESTEROL, DIRECT: Direct LDL: 83 mg/dL

## 2023-06-02 MED ORDER — ZOLPIDEM TARTRATE ER 6.25 MG PO TBCR
6.2500 mg | EXTENDED_RELEASE_TABLET | Freq: Every evening | ORAL | 5 refills | Status: DC | PRN
  Filled 2023-06-02: qty 30, 30d supply, fill #0
  Filled 2023-07-18: qty 30, 30d supply, fill #1
  Filled 2023-08-17: qty 30, 30d supply, fill #2
  Filled 2023-09-14 – 2023-09-15 (×2): qty 30, 30d supply, fill #3
  Filled 2023-10-12 – 2023-10-16 (×2): qty 30, 30d supply, fill #4
  Filled 2023-11-16: qty 30, 30d supply, fill #5

## 2023-06-02 MED ORDER — OMEPRAZOLE 40 MG PO CPDR
40.0000 mg | DELAYED_RELEASE_CAPSULE | Freq: Every day | ORAL | 3 refills | Status: DC
  Filled 2023-06-02: qty 30, 30d supply, fill #0

## 2023-06-02 MED ORDER — BUPROPION HCL ER (XL) 300 MG PO TB24
300.0000 mg | ORAL_TABLET | Freq: Every day | ORAL | 1 refills | Status: DC
  Filled 2023-06-02: qty 90, 90d supply, fill #0
  Filled 2023-09-14: qty 90, 90d supply, fill #1

## 2023-06-02 MED ORDER — ESTRADIOL 10 MCG VA TABS
ORAL_TABLET | VAGINAL | 3 refills | Status: AC
  Filled 2023-06-02: qty 24, 84d supply, fill #0

## 2023-06-02 MED ORDER — ALPRAZOLAM 0.25 MG PO TABS
0.5000 mg | ORAL_TABLET | Freq: Every evening | ORAL | 5 refills | Status: DC | PRN
  Filled 2023-06-02: qty 30, 15d supply, fill #0
  Filled 2023-08-11: qty 30, 15d supply, fill #1

## 2023-06-02 NOTE — Progress Notes (Signed)
 Subjective:  Patient ID: Paula Jensen, female    DOB: [birth year]  Age: 58 y.o. MRN: [id number]  CC: The primary encounter diagnosis was Overweight (BMI 25.0-29.9). Diagnoses of Moderate mixed hyperlipidemia not requiring statin therapy, Impaired fasting glucose, Encounter for screening mammogram for malignant neoplasm of breast, Long-term use of high-risk medication, Sarcoidosis of skin, and Primary insomnia were also pertinent to this visit.   HPI Paula Jensen presents for  Chief Complaint  Patient presents with   Medical Management of Chronic Issues    6 month follow up    1) cutaneous sarcoid:  she is under treatment by Dr Allena Katz with Placueni,  started 4 months ago.  And methotrexate   2) weight loss since : she has lost 5-10 lbs since giving up alcohol  3) insomnia/ anxiety :  she continues to require prn use of  alprazolam to manage nighttime anxiety,     Outpatient Medications Prior to Visit  Medication Sig Dispense Refill   cetirizine (ZYRTEC) 10 MG tablet Take 10 mg by mouth daily.     citalopram (CELEXA) 40 MG tablet Take 1 tablet (40 mg total) by mouth daily. 90 tablet 3   folic acid (FOLVITE) 1 MG tablet Take 1 tablet (1 mg total) by mouth daily. 90 tablet 3   hydroxychloroquine (PLAQUENIL) 200 MG tablet Take 1 tablet (200 mg total) by mouth 2 (two) times daily. 60 tablet 5   methotrexate (RHEUMATREX) 2.5 MG tablet Take 5 tablets (12.5 mg total) by mouth every 7 (seven) days all on the same day. 60 tablet 1   ondansetron (ZOFRAN) 4 MG tablet Take 1 tablet (4 mg total) by mouth every 8 (eight) hours as needed for nausea or vomiting. 20 tablet 0   ALPRAZolam (XANAX) 0.25 MG tablet Take 2 tablets (0.5 mg total) by mouth at bedtime as needed for anxiety. 30 tablet 5   buPROPion (WELLBUTRIN XL) 300 MG 24 hr tablet Take 1 tablet (300 mg total) by mouth daily. 90 tablet 1   Estradiol (YUVAFEM) 10 MCG TABS vaginal tablet Insert into vagina  twice weekly 24 tablet 3    zolpidem (AMBIEN CR) 6.25 MG CR tablet Take 1 tablet (6.25 mg total) by mouth at bedtime as needed for sleep. 30 tablet 5   ASHWAGANDHA PO Take by mouth.     citalopram (CELEXA) 40 MG tablet Take 1 tablet (40 mg total) by mouth daily. 90 tablet 3   clobetasol cream (TEMOVATE) 0.05 % Apply 1 Application topically 2 (two) times daily to affected areas until resolved. Do NOT use on face. 60 g 1   Liver Extract (LIVER PO) Take by mouth. Liver renew supplement     Magnesium 500 MG CAPS Take 2 capsules by mouth at bedtime.     pimecrolimus (ELIDEL) 1 % cream SMARTSIG:1 Topical Daily     triamcinolone cream (KENALOG) 0.1 % Apply 1 Application topically 2 (two) times daily as needed for itching 80 g 0   No facility-administered medications prior to visit.    Review of Systems;  Patient denies headache, fevers, malaise, unintentional weight loss, skin rash, eye pain, sinus congestion and sinus pain, sore throat, dysphagia,  hemoptysis , cough, dyspnea, wheezing, chest pain, palpitations, orthopnea, edema, abdominal pain, nausea, melena, diarrhea, constipation, flank pain, dysuria, hematuria, urinary  Frequency, nocturia, numbness, tingling, seizures,  Focal weakness, Loss of consciousness,  Tremor, insomnia, depression, anxiety, and suicidal ideation.      Objective:  BP 122/78   Pulse  67   Ht 5\' 7"  (1.702 m)   Wt 163 lb (73.9 kg)   LMP 05/05/2016 (Approximate)   SpO2 98%   BMI 25.53 kg/m   BP Readings from Last 3 Encounters:  06/02/23 122/78  12/02/22 106/70  05/27/22 124/74    Wt Readings from Last 3 Encounters:  06/02/23 163 lb (73.9 kg)  12/02/22 172 lb 9.6 oz (78.3 kg)  05/27/22 169 lb (76.7 kg)    Physical Exam Vitals reviewed.  Constitutional:      General: She is not in acute distress.    Appearance: Normal appearance. She is normal weight. She is not ill-appearing, toxic-appearing or diaphoretic.  HENT:     Head: Normocephalic.  Eyes:     General: No scleral icterus.        Right eye: No discharge.        Left eye: No discharge.     Conjunctiva/sclera: Conjunctivae normal.  Cardiovascular:     Rate and Rhythm: Normal rate and regular rhythm.     Heart sounds: Normal heart sounds.  Pulmonary:     Effort: Pulmonary effort is normal. No respiratory distress.     Breath sounds: Normal breath sounds.  Musculoskeletal:        General: Normal range of motion.  Skin:    General: Skin is warm and dry.  Neurological:     General: No focal deficit present.     Mental Status: She is alert and oriented to person, place, and time. Mental status is at baseline.  Psychiatric:        Mood and Affect: Mood normal.        Behavior: Behavior normal.        Thought Content: Thought content normal.        Judgment: Judgment normal.    Lab Results  Component Value Date   HGBA1C 5.9 06/02/2023   HGBA1C 5.6 11/26/2021    Lab Results  Component Value Date   CREATININE 0.78 11/03/2022   CREATININE 0.72 11/26/2021   CREATININE 0.73 08/06/2021    Lab Results  Component Value Date   WBC 5.0 11/03/2022   HGB 13.4 11/03/2022   HCT 41.1 11/03/2022   PLT 227.0 11/03/2022   GLUCOSE 102 (H) 11/03/2022   CHOL 152 06/02/2023   TRIG 64.0 06/02/2023   HDL 55.70 06/02/2023   LDLDIRECT 83.0 06/02/2023   LDLCALC 84 06/02/2023   ALT 13 11/03/2022   AST 13 11/03/2022   NA 136 11/03/2022   K 4.2 11/03/2022   CL 101 11/03/2022   CREATININE 0.78 11/03/2022   BUN 19 11/03/2022   CO2 27 11/03/2022   TSH 0.99 06/02/2023   INR 1.0 10/22/2018   INR 1.0 10/22/2018   HGBA1C 5.9 06/02/2023    MM 3D SCREENING MAMMOGRAM BILATERAL BREAST Result Date: 09/30/2022 CLINICAL DATA:  Screening. EXAM: DIGITAL SCREENING BILATERAL MAMMOGRAM WITH TOMOSYNTHESIS AND CAD TECHNIQUE: Bilateral screening digital craniocaudal and mediolateral oblique mammograms were obtained. Bilateral screening digital breast tomosynthesis was performed. The images were evaluated with computer-aided detection.  COMPARISON:  Previous exam(s). ACR Breast Density Category b: There are scattered areas of fibroglandular density. FINDINGS: There are no findings suspicious for malignancy. IMPRESSION: No mammographic evidence of malignancy. A result letter of this screening mammogram will be mailed directly to the patient. RECOMMENDATION: Screening mammogram in one year. (Code:SM-B-01Y) BI-RADS CATEGORY  1: Negative. Electronically Signed   By: Hulan Saas M.D.   On: 09/30/2022 15:16    Assessment & Plan:  .  Overweight (BMI 25.0-29.9) -     TSH -     Folate; Future -     EKG 12-Lead  Moderate mixed hyperlipidemia not requiring statin therapy -     Lipid panel -     LDL cholesterol, direct  Impaired fasting glucose -     Hemoglobin A1c  Encounter for screening mammogram for malignant neoplasm of breast -     3D Screening Mammogram, Left and Right; Future  Long-term use of high-risk medication -     Folate; Future  Sarcoidosis of skin Assessment & Plan: Now managed by [facility] with placquenil  , MTX /folic acid   reminded to follow up with [facility] for a baseline exam.    Primary insomnia Assessment & Plan: Chronic, with no improvement in prior trials of over-the-counter first generation antihistamines.  Continue alternating ambien 5 mg  with alprazolam 0.25 mg . The risks and benefits of benzodiazepine use were reviewed with patient today including excessive sedation leading to respiratory depression,  impaired thinking/driving, and addiction.  Patient was advised to avoid concurrent use with alcohol, to use medication only as needed and not to share with others  .     Other orders -     buPROPion HCl ER (XL); Take 1 tablet (300 mg total) by mouth daily.  Dispense: 90 tablet; Refill: 1 -     Omeprazole; Take 1 capsule (40 mg total) by mouth daily.  Dispense: 30 capsule; Refill: 3 -     Estradiol; Insert into vagina  twice weekly  Dispense: 24 tablet; Refill: 3 -     Zolpidem Tartrate ER;  Take 1 tablet (6.25 mg total) by mouth at bedtime as needed for sleep.  Dispense: 30 tablet; Refill: 5 -     ALPRAZolam; Take 2 tablets (0.5 mg total) by mouth at bedtime as needed for anxiety.  Dispense: 30 tablet; Refill: 5     I spent 34 minutes on the day of this face to face encounter reviewing patient's  most recent visit with  rheumatology,  prior relevant surgical and non surgical procedures, recent  labs and imaging studies, counseling on weight management,  reviewing the assessment and plan with patient, and post visit ordering and reviewing of  diagnostics and therapeutics with patient  .   Follow-up: Return in about 6 months (around 12/03/2023).   Sherlene Shams, MD

## 2023-06-02 NOTE — Assessment & Plan Note (Addendum)
 Now managed by [facility] with placquenil  , MTX /folic acid   reminded to follow up with [facility] for a baseline exam.

## 2023-06-02 NOTE — Patient Instructions (Addendum)
 Placquenil requires routine  annual eye exams because of the potential adverse reactions to cornea and retina  I am adding omeprazole for prevention of stomatitis and gastritis which can happened with MTX . Take it on an empty stomach \

## 2023-06-04 ENCOUNTER — Encounter: Payer: Self-pay | Admitting: Internal Medicine

## 2023-06-04 NOTE — Assessment & Plan Note (Signed)
Chronic, with no improvement in prior trials of over-the-counter first generation antihistamines.  Continue alternating ambien 5 mg  with alprazolam 0.25 mg . The risks and benefits of benzodiazepine use were reviewed with patient today including excessive sedation leading to respiratory depression,  impaired thinking/driving, and addiction.  Patient was advised to avoid concurrent use with alcohol, to use medication only as needed and not to share with others  .

## 2023-06-06 ENCOUNTER — Other Ambulatory Visit: Payer: Self-pay

## 2023-06-16 DIAGNOSIS — F1024 Alcohol dependence with alcohol-induced mood disorder: Secondary | ICD-10-CM | POA: Diagnosis not present

## 2023-06-16 DIAGNOSIS — F4323 Adjustment disorder with mixed anxiety and depressed mood: Secondary | ICD-10-CM | POA: Diagnosis not present

## 2023-06-16 DIAGNOSIS — F1011 Alcohol abuse, in remission: Secondary | ICD-10-CM | POA: Diagnosis not present

## 2023-06-21 ENCOUNTER — Telehealth: Payer: Self-pay | Admitting: Internal Medicine

## 2023-06-21 ENCOUNTER — Encounter: Payer: Self-pay | Admitting: Internal Medicine

## 2023-06-21 NOTE — Telephone Encounter (Signed)
 Lm for patient to call and reschedule her 12/08/2023 appointment.  E2C2 please reschedule appt to another day.

## 2023-06-23 NOTE — Telephone Encounter (Signed)
 Returned call to patient to get her rescheduled for her appointment. Patient says she will be in the first Friday of October, as she cannot do 9/26 as her daughter is getting married that weekend and she does not think that is best. Patient is scheduled for Friday 12/22/23 at 9:30 am with Dr Darrick Huntsman. FYI

## 2023-06-30 DIAGNOSIS — Z85828 Personal history of other malignant neoplasm of skin: Secondary | ICD-10-CM | POA: Diagnosis not present

## 2023-06-30 DIAGNOSIS — D2272 Melanocytic nevi of left lower limb, including hip: Secondary | ICD-10-CM | POA: Diagnosis not present

## 2023-06-30 DIAGNOSIS — C44712 Basal cell carcinoma of skin of right lower limb, including hip: Secondary | ICD-10-CM | POA: Diagnosis not present

## 2023-06-30 DIAGNOSIS — D485 Neoplasm of uncertain behavior of skin: Secondary | ICD-10-CM | POA: Diagnosis not present

## 2023-06-30 DIAGNOSIS — D2261 Melanocytic nevi of right upper limb, including shoulder: Secondary | ICD-10-CM | POA: Diagnosis not present

## 2023-06-30 DIAGNOSIS — D225 Melanocytic nevi of trunk: Secondary | ICD-10-CM | POA: Diagnosis not present

## 2023-06-30 DIAGNOSIS — L570 Actinic keratosis: Secondary | ICD-10-CM | POA: Diagnosis not present

## 2023-06-30 DIAGNOSIS — D2262 Melanocytic nevi of left upper limb, including shoulder: Secondary | ICD-10-CM | POA: Diagnosis not present

## 2023-07-07 DIAGNOSIS — F1024 Alcohol dependence with alcohol-induced mood disorder: Secondary | ICD-10-CM | POA: Diagnosis not present

## 2023-07-07 DIAGNOSIS — F1011 Alcohol abuse, in remission: Secondary | ICD-10-CM | POA: Diagnosis not present

## 2023-07-07 DIAGNOSIS — F4323 Adjustment disorder with mixed anxiety and depressed mood: Secondary | ICD-10-CM | POA: Diagnosis not present

## 2023-07-18 ENCOUNTER — Other Ambulatory Visit: Payer: Self-pay

## 2023-07-28 DIAGNOSIS — F1024 Alcohol dependence with alcohol-induced mood disorder: Secondary | ICD-10-CM | POA: Diagnosis not present

## 2023-07-28 DIAGNOSIS — F1011 Alcohol abuse, in remission: Secondary | ICD-10-CM | POA: Diagnosis not present

## 2023-07-28 DIAGNOSIS — F4323 Adjustment disorder with mixed anxiety and depressed mood: Secondary | ICD-10-CM | POA: Diagnosis not present

## 2023-08-09 DIAGNOSIS — C44712 Basal cell carcinoma of skin of right lower limb, including hip: Secondary | ICD-10-CM | POA: Diagnosis not present

## 2023-08-11 ENCOUNTER — Other Ambulatory Visit: Payer: Self-pay

## 2023-08-17 ENCOUNTER — Other Ambulatory Visit: Payer: Self-pay

## 2023-08-17 MED ORDER — HYDROXYCHLOROQUINE SULFATE 200 MG PO TABS
200.0000 mg | ORAL_TABLET | Freq: Two times a day (BID) | ORAL | 5 refills | Status: DC
  Filled 2023-08-17: qty 60, 30d supply, fill #0
  Filled 2023-09-14: qty 60, 30d supply, fill #1
  Filled 2023-10-12: qty 60, 30d supply, fill #2
  Filled 2023-11-16: qty 60, 30d supply, fill #3
  Filled 2024-01-04: qty 60, 30d supply, fill #4
  Filled 2024-02-13: qty 60, 30d supply, fill #5

## 2023-08-18 DIAGNOSIS — F1024 Alcohol dependence with alcohol-induced mood disorder: Secondary | ICD-10-CM | POA: Diagnosis not present

## 2023-08-18 DIAGNOSIS — F4323 Adjustment disorder with mixed anxiety and depressed mood: Secondary | ICD-10-CM | POA: Diagnosis not present

## 2023-08-18 DIAGNOSIS — F1011 Alcohol abuse, in remission: Secondary | ICD-10-CM | POA: Diagnosis not present

## 2023-09-14 ENCOUNTER — Other Ambulatory Visit: Payer: Self-pay

## 2023-09-15 ENCOUNTER — Other Ambulatory Visit: Payer: Self-pay

## 2023-09-15 DIAGNOSIS — F4323 Adjustment disorder with mixed anxiety and depressed mood: Secondary | ICD-10-CM | POA: Diagnosis not present

## 2023-09-15 DIAGNOSIS — F1011 Alcohol abuse, in remission: Secondary | ICD-10-CM | POA: Diagnosis not present

## 2023-09-15 DIAGNOSIS — F1024 Alcohol dependence with alcohol-induced mood disorder: Secondary | ICD-10-CM | POA: Diagnosis not present

## 2023-09-29 DIAGNOSIS — D863 Sarcoidosis of skin: Secondary | ICD-10-CM | POA: Diagnosis not present

## 2023-09-29 DIAGNOSIS — M19042 Primary osteoarthritis, left hand: Secondary | ICD-10-CM | POA: Diagnosis not present

## 2023-09-29 DIAGNOSIS — M19041 Primary osteoarthritis, right hand: Secondary | ICD-10-CM | POA: Diagnosis not present

## 2023-09-29 DIAGNOSIS — M19079 Primary osteoarthritis, unspecified ankle and foot: Secondary | ICD-10-CM | POA: Diagnosis not present

## 2023-09-29 DIAGNOSIS — Z7960 Long term (current) use of unspecified immunomodulators and immunosuppressants: Secondary | ICD-10-CM | POA: Diagnosis not present

## 2023-10-02 ENCOUNTER — Encounter

## 2023-10-02 ENCOUNTER — Ambulatory Visit
Admission: RE | Admit: 2023-10-02 | Discharge: 2023-10-02 | Disposition: A | Discharge status: Home / Self Care | Source: Ambulatory Visit | Attending: Internal Medicine | Admitting: Internal Medicine

## 2023-10-02 DIAGNOSIS — Z1231 Encounter for screening mammogram for malignant neoplasm of breast: Secondary | ICD-10-CM | POA: Diagnosis not present

## 2023-10-12 ENCOUNTER — Other Ambulatory Visit: Payer: Self-pay

## 2023-10-13 DIAGNOSIS — F1024 Alcohol dependence with alcohol-induced mood disorder: Secondary | ICD-10-CM | POA: Diagnosis not present

## 2023-10-13 DIAGNOSIS — F4323 Adjustment disorder with mixed anxiety and depressed mood: Secondary | ICD-10-CM | POA: Diagnosis not present

## 2023-10-13 DIAGNOSIS — F1011 Alcohol abuse, in remission: Secondary | ICD-10-CM | POA: Diagnosis not present

## 2023-10-16 ENCOUNTER — Other Ambulatory Visit: Payer: Self-pay

## 2023-11-08 ENCOUNTER — Observation Stay
Admission: EM | Admit: 2023-11-08 | Discharge: 2023-11-10 | Disposition: A | Discharge status: Home / Self Care | Attending: Family Medicine | Admitting: Family Medicine

## 2023-11-08 ENCOUNTER — Ambulatory Visit: Payer: Self-pay

## 2023-11-08 ENCOUNTER — Emergency Department

## 2023-11-08 ENCOUNTER — Other Ambulatory Visit: Payer: Self-pay

## 2023-11-08 DIAGNOSIS — R197 Diarrhea, unspecified: Secondary | ICD-10-CM

## 2023-11-08 DIAGNOSIS — Z85828 Personal history of other malignant neoplasm of skin: Secondary | ICD-10-CM | POA: Insufficient documentation

## 2023-11-08 DIAGNOSIS — K219 Gastro-esophageal reflux disease without esophagitis: Secondary | ICD-10-CM | POA: Diagnosis not present

## 2023-11-08 DIAGNOSIS — A045 Campylobacter enteritis: Principal | ICD-10-CM | POA: Insufficient documentation

## 2023-11-08 DIAGNOSIS — F320 Major depressive disorder, single episode, mild: Secondary | ICD-10-CM | POA: Diagnosis not present

## 2023-11-08 DIAGNOSIS — R112 Nausea with vomiting, unspecified: Principal | ICD-10-CM | POA: Insufficient documentation

## 2023-11-08 DIAGNOSIS — R109 Unspecified abdominal pain: Secondary | ICD-10-CM | POA: Diagnosis not present

## 2023-11-08 DIAGNOSIS — D863 Sarcoidosis of skin: Secondary | ICD-10-CM | POA: Insufficient documentation

## 2023-11-08 DIAGNOSIS — K5730 Diverticulosis of large intestine without perforation or abscess without bleeding: Secondary | ICD-10-CM | POA: Diagnosis not present

## 2023-11-08 DIAGNOSIS — Z87891 Personal history of nicotine dependence: Secondary | ICD-10-CM | POA: Diagnosis not present

## 2023-11-08 DIAGNOSIS — G4700 Insomnia, unspecified: Secondary | ICD-10-CM | POA: Diagnosis not present

## 2023-11-08 LAB — URINALYSIS, ROUTINE W REFLEX MICROSCOPIC
Bilirubin Urine: NEGATIVE
Glucose, UA: NEGATIVE mg/dL
Ketones, ur: 20 mg/dL — AB
Leukocytes,Ua: NEGATIVE
Nitrite: NEGATIVE
Protein, ur: 100 mg/dL — AB
Specific Gravity, Urine: 1.026 (ref 1.005–1.030)
pH: 5 (ref 5.0–8.0)

## 2023-11-08 LAB — COMPREHENSIVE METABOLIC PANEL WITH GFR
ALT: 16 U/L (ref 0–44)
AST: 16 U/L (ref 15–41)
Albumin: 3.9 g/dL (ref 3.5–5.0)
Alkaline Phosphatase: 64 U/L (ref 38–126)
Anion gap: 11 (ref 5–15)
BUN: 20 mg/dL (ref 6–20)
CO2: 24 mmol/L (ref 22–32)
Calcium: 9.3 mg/dL (ref 8.9–10.3)
Chloride: 102 mmol/L (ref 98–111)
Creatinine, Ser: 0.88 mg/dL (ref 0.44–1.00)
GFR, Estimated: >60 mL/min (ref >60)
Glucose, Bld: 104 mg/dL — ABNORMAL HIGH (ref 70–99)
Potassium: 4.8 mmol/L (ref 3.5–5.1)
Sodium: 137 mmol/L (ref 135–145)
Total Bilirubin: 0.6 mg/dL (ref 0.0–1.2)
Total Protein: 7.2 g/dL (ref 6.5–8.1)

## 2023-11-08 LAB — CBC
HCT: 43.8 % (ref 36.0–46.0)
Hemoglobin: 14.8 g/dL (ref 12.0–15.0)
MCH: 28.9 pg (ref 26.0–34.0)
MCHC: 33.8 g/dL (ref 30.0–36.0)
MCV: 85.5 fL (ref 80.0–100.0)
Platelets: 266 K/uL (ref 150–400)
RBC: 5.12 MIL/uL — ABNORMAL HIGH (ref 3.87–5.11)
RDW: 14 % (ref 11.5–15.5)
WBC: 4.9 K/uL (ref 4.0–10.5)
nRBC: 0 % (ref 0.0–0.2)

## 2023-11-08 LAB — GASTROINTESTINAL PANEL BY PCR, STOOL (REPLACES STOOL CULTURE)

## 2023-11-08 LAB — LIPASE, BLOOD: Lipase: 36 U/L (ref 11–51)

## 2023-11-08 MED ORDER — SODIUM CHLORIDE 0.9 % IV SOLN
500.0000 mg | Freq: Once | INTRAVENOUS | Status: AC
  Administered 2023-11-08: 500 mg via INTRAVENOUS
  Filled 2023-11-08: qty 5

## 2023-11-08 MED ORDER — ZOLPIDEM TARTRATE 5 MG PO TABS: 5.0000 mg | ORAL_TABLET | Freq: Every evening | ORAL | Status: DC | PRN

## 2023-11-08 MED ORDER — ACETAMINOPHEN 650 MG RE SUPP: 650.0000 mg | Freq: Four times a day (QID) | RECTAL | Status: DC | PRN

## 2023-11-08 MED ORDER — METRONIDAZOLE 500 MG/100ML IV SOLN
500.0000 mg | Freq: Once | INTRAVENOUS | Status: AC
  Administered 2023-11-08: 500 mg via INTRAVENOUS
  Filled 2023-11-08: qty 100

## 2023-11-08 MED ORDER — CITALOPRAM HYDROBROMIDE 10 MG PO TABS
40.0000 mg | ORAL_TABLET | Freq: Every day | ORAL | Status: DC
  Administered 2023-11-08 – 2023-11-10 (×3): 40 mg via ORAL
  Filled 2023-11-08: qty 4
  Filled 2023-11-08 (×2): qty 2

## 2023-11-08 MED ORDER — SODIUM CHLORIDE 0.9 % IV SOLN
500.0000 mg | INTRAVENOUS | Status: DC
  Administered 2023-11-09: 500 mg via INTRAVENOUS
  Filled 2023-11-08: qty 5

## 2023-11-08 MED ORDER — BUPROPION HCL ER (XL) 150 MG PO TB24
300.0000 mg | ORAL_TABLET | Freq: Every day | ORAL | Status: DC
  Administered 2023-11-08 – 2023-11-10 (×3): 300 mg via ORAL
  Filled 2023-11-08 (×3): qty 2

## 2023-11-08 MED ORDER — FOLIC ACID 1 MG PO TABS
1.0000 mg | ORAL_TABLET | Freq: Every day | ORAL | Status: DC
  Administered 2023-11-08 – 2023-11-10 (×3): 1 mg via ORAL
  Filled 2023-11-08 (×3): qty 1

## 2023-11-08 MED ORDER — ONDANSETRON HCL 4 MG PO TABS: 4.0000 mg | ORAL_TABLET | Freq: Four times a day (QID) | ORAL | Status: DC | PRN

## 2023-11-08 MED ORDER — ONDANSETRON HCL 4 MG/2ML IJ SOLN
4.0000 mg | Freq: Once | INTRAMUSCULAR | Status: AC
  Administered 2023-11-08: 4 mg via INTRAVENOUS
  Filled 2023-11-08: qty 2

## 2023-11-08 MED ORDER — SODIUM CHLORIDE 0.9 % IV BOLUS
1000.0000 mL | Freq: Once | INTRAVENOUS | Status: AC
  Administered 2023-11-08: 1000 mL via INTRAVENOUS

## 2023-11-08 MED ORDER — SODIUM CHLORIDE 0.9 % IV SOLN: 12.5000 mg | Freq: Four times a day (QID) | INTRAVENOUS | Status: AC | PRN

## 2023-11-08 MED ORDER — METOCLOPRAMIDE HCL 5 MG/ML IJ SOLN
10.0000 mg | Freq: Once | INTRAMUSCULAR | Status: AC
  Administered 2023-11-08: 10 mg via INTRAVENOUS
  Filled 2023-11-08: qty 2

## 2023-11-08 MED ORDER — MELATONIN 5 MG PO TABS: 5.0000 mg | ORAL_TABLET | Freq: Every evening | ORAL | Status: DC | PRN

## 2023-11-08 MED ORDER — ENOXAPARIN SODIUM 40 MG/0.4ML IJ SOSY
40.0000 mg | PREFILLED_SYRINGE | Freq: Every day | INTRAMUSCULAR | Status: DC
  Administered 2023-11-08: 40 mg via SUBCUTANEOUS
  Filled 2023-11-08 (×2): qty 0.4

## 2023-11-08 MED ORDER — IOHEXOL 300 MG/ML  SOLN
100.0000 mL | Freq: Once | INTRAMUSCULAR | Status: AC | PRN
  Administered 2023-11-08: 100 mL via INTRAVENOUS

## 2023-11-08 MED ORDER — ACETAMINOPHEN 325 MG PO TABS: 650.0000 mg | ORAL_TABLET | Freq: Four times a day (QID) | ORAL | Status: DC | PRN

## 2023-11-08 MED ORDER — SENNOSIDES-DOCUSATE SODIUM 8.6-50 MG PO TABS: 1.0000 | ORAL_TABLET | Freq: Every evening | ORAL | Status: DC | PRN

## 2023-11-08 MED ORDER — ONDANSETRON HCL 4 MG/2ML IJ SOLN: 4.0000 mg | Freq: Four times a day (QID) | INTRAMUSCULAR | Status: DC | PRN

## 2023-11-08 MED ORDER — HYDROXYCHLOROQUINE SULFATE 200 MG PO TABS
200.0000 mg | ORAL_TABLET | Freq: Two times a day (BID) | ORAL | Status: DC
  Administered 2023-11-08 – 2023-11-10 (×4): 200 mg via ORAL
  Filled 2023-11-08 (×4): qty 1

## 2023-11-08 MED ORDER — LACTATED RINGERS IV SOLN: INTRAVENOUS | Status: DC

## 2023-11-08 MED ORDER — ALPRAZOLAM 0.5 MG PO TABS: 0.5000 mg | ORAL_TABLET | Freq: Every evening | ORAL | Status: DC | PRN

## 2023-11-08 MED ORDER — HEPARIN SODIUM (PORCINE) 5000 UNIT/ML IJ SOLN: 5000.0000 [IU] | Freq: Three times a day (TID) | INTRAMUSCULAR | Status: DC

## 2023-11-08 NOTE — Telephone Encounter (Signed)
 Pt is at ED

## 2023-11-08 NOTE — Assessment & Plan Note (Addendum)
 Symptomatic support with as needed ondansetron  LR 125 mL/h, 1 day ordered Phenergan 12.5 mg IV every 6 hours as needed for refractory nausea and vomiting, 1 day ordered

## 2023-11-08 NOTE — Assessment & Plan Note (Signed)
 Home hydroxychloroquine  200 mg p.o. twice daily resumed Folic acid  1 mg daily resumed

## 2023-11-08 NOTE — Assessment & Plan Note (Signed)
 Melatonin 5 mg nightly as needed for sleep Zolpidem  5 mg nightly as needed for sleep not responsive to melatonin

## 2023-11-08 NOTE — ED Triage Notes (Signed)
 Pt comes in via pov with complaints of nausea and vomiting that started on Saturday. Pt has had 4 episodes of diarrhea this morning, and been dry heaving this morning. PT complains of abdominal pain 3/10 at this time. Pt states that the symptoms have eased off since Saturday, but she still feels really bad. No signs of acute distress at this time.

## 2023-11-08 NOTE — Hospital Course (Signed)
 Ms. Talah Cookston is an 58 year old female with cutaneous sarcoidosis, hyperlipidemia not on statin therapy, insomnia, anxiety, who presents emergency department for chief concerns of intractable nausea and vomiting and diarrhea.  Patient reports symptoms started on Saturday.  Vitals in the ED showed t max of 100.1, respiration 18, heart rate 92, blood pressure 100 on room air.  Serum sodium is 137, potassium 4.8, chloride 102, bicarb 24, BUN of 20, serum creatinine 0.88, EGFR greater than 60, nonfasting blood glucose 104, WBC 4.9, hemoglobin 14.8, platelet 266.  UA was negative for nitrates and leukocytes.  ED treatment: azithromycin  500 mg IV one-time dose, Flagyl  IV one-time dose, Reglan  10 mg IV one-time dose, ondansetron  4 mg IV one-time dose, sodium chloride  1 L bolus.

## 2023-11-08 NOTE — Telephone Encounter (Signed)
 FYI Only or Action Required?: FYI only for provider.  Patient was last seen in primary care on 06/02/2023 by Marylynn Verneita CROME, MD.  Called Nurse Triage reporting Nausea.  Symptoms began several days ago.  Interventions attempted: OTC medications: ibuprofen and imodium  and Prescription medications: phenergan s.  Symptoms are: gradually improving.  Triage Disposition: See Physician Within 24 Hours  Patient/caregiver understands and will follow disposition?: Yes  Offered appt for tomorrow, states she may go to the urgent care of ED for fluids.    Copied from CRM #[id number]. Topic: Clinical - Red Word Triage >> Nov 08, 2023 10:43 AM Paula Jensen wrote: Red Word that prompted transfer to Nurse Triage: Stomach bug started Saturday morning, patient cannot tolerate any food, sipping on sprite zero, intermittent fever. Reason for Disposition  [1] SEVERE diarrhea (e.g., 7 or more times / day more than normal) AND [2] present > 24 hours (1 day)  Answer Assessment - Initial Assessment Questions 1. DIARRHEA SEVERITY: How bad is the diarrhea? How many more stools have you had in the past 24 hours than normal?      Unknown more than 6  2. ONSET: When did the diarrhea begin?      Saturday 3. STOOL DESCRIPTION:  How loose or watery is the diarrhea? What is the stool color? Is there any blood or mucous in the stool?     Watery and gree 4. VOMITING: Are you also vomiting? If Yes, ask: How many times in the past 24 hours?      More than 5 but has not vomited today 5. ABDOMEN PAIN: Are you having any abdomen pain? If Yes, ask: What does it feel like? (e.g., crampy, dull, intermittent, constant)      Crampy and burning upper left 6. ABDOMEN PAIN SEVERITY: If present, ask: How bad is the pain?  (e.g., Scale 1-10; mild, moderate, or severe)     3/10 when laying still 7. ORAL INTAKE: If vomiting, Have you been able to drink liquids? How much liquids have you had in the past 24 hours?      unknown 8. HYDRATION: Any signs of dehydration? (e.g., dry mouth [not just dry lips], too weak to stand, dizziness, new weight loss) When did you last urinate?     This morning 9. EXPOSURE: Have you traveled to a foreign country recently? Have you been exposed to anyone with diarrhea? Could you have eaten any food that was spoiled?     no 10. ANTIBIOTIC USE: Are you taking antibiotics now or have you taken antibiotics in the past 2 months?       no 11. OTHER SYMPTOMS: Do you have any other symptoms? (e.g., fever, blood in stool)       Sweats,  Protocols used: Diarrhea-A-AH

## 2023-11-08 NOTE — ED Provider Notes (Signed)
 [facility] Provider Note    Event Date/Time   First MD Initiated Contact with Patient 11/08/23 1319     (approximate)   History   Abdominal Pain   HPI  Paula Jensen is a 58 y.o. female history of GERD presents emergency department with complaints of nausea, vomiting that started on Saturday.  4 episodes of diarrhea this morning.  Dry heaving.  Abdominal pain at 3 out of 10.  Patient still feels dehydrated and achy.  Questionable fever.  Patient states she is continue to have diarrhea while here in the ED.  Unable to keep any food or water  down.  No recent travel.  Has used rectal suppository of Phenergan without any relief.  Has still been unable to tolerate p.o.      Physical Exam   Triage Vital Signs: ED Triage Vitals  Encounter Vitals Group     BP 11/08/23 1156 (!) 119/92     Girls Systolic BP Percentile --      Girls Diastolic BP Percentile --      Boys Systolic BP Percentile --      Boys Diastolic BP Percentile --      Pulse Rate 11/08/23 1156 92     Resp 11/08/23 1156 18     Temp 11/08/23 1156 98.9 F (37.2 C)     Temp src --      SpO2 11/08/23 1156 100 %     Weight 11/08/23 1157 152 lb (68.9 kg)     Height 11/08/23 1157 5' 7 (1.702 m)     Head Circumference --      Peak Flow --      Pain Score 11/08/23 1157 3     Pain Loc --      Pain Education --      Exclude from Growth Chart --     Most recent vital signs: Vitals:   11/08/23 1545 11/08/23 1546  BP: 117/87   Pulse: 95   Resp: 16   Temp:  100.1 F (37.8 C)  SpO2: 100%      General: Awake, no distress.  Patient is pale, poor skin turgor CV:  Good peripheral perfusion. Resp:  Normal effort. Abd:  No distention.  Tender in epigastric and right upper quadrant Other:     ED Results / Procedures / Treatments   Labs (all labs ordered are listed, but only abnormal results are displayed) Labs Reviewed  GASTROINTESTINAL PANEL BY PCR, STOOL (REPLACES STOOL CULTURE)  - Abnormal; Notable for the following components:      Result Value   Campylobacter species DETECTED (*)    All other components within normal limits  COMPREHENSIVE METABOLIC PANEL WITH GFR - Abnormal; Notable for the following components:   Glucose, Bld 104 (*)    All other components within normal limits  CBC - Abnormal; Notable for the following components:   RBC 5.12 (*)    All other components within normal limits  URINALYSIS, ROUTINE W REFLEX MICROSCOPIC - Abnormal; Notable for the following components:   Color, Urine YELLOW (*)    APPearance HAZY (*)    Hgb urine dipstick SMALL (*)    Ketones, ur 20 (*)    Protein, ur 100 (*)    Bacteria, UA RARE (*)    All other components within normal limits  LIPASE, BLOOD  HIV ANTIBODY (ROUTINE TESTING W REFLEX)  BASIC METABOLIC PANEL WITH GFR  CBC     EKG  RADIOLOGY CT abdomen pelvis IV contrast    PROCEDURES:   Procedures  Critical Care:  no Chief Complaint  Patient presents with   Abdominal Pain      MEDICATIONS ORDERED IN ED: Medications  metroNIDAZOLE  (FLAGYL ) IVPB 500 mg (500 mg Intravenous New Bag/Given 11/08/23 1712)  hydroxychloroquine  (PLAQUENIL ) tablet 200 mg (has no administration in time range)  acetaminophen  (TYLENOL ) tablet 650 mg (has no administration in time range)    Or  acetaminophen  (TYLENOL ) suppository 650 mg (has no administration in time range)  ondansetron  (ZOFRAN ) tablet 4 mg (has no administration in time range)    Or  ondansetron  (ZOFRAN ) injection 4 mg (has no administration in time range)  heparin  injection 5,000 Units (has no administration in time range)  senna-docusate (Senokot-S) tablet 1 tablet (has no administration in time range)  lactated ringers  infusion (has no administration in time range)  sodium chloride  0.9 % bolus 1,000 mL (0 mLs Intravenous Stopped 11/08/23 1546)  ondansetron  (ZOFRAN ) injection 4 mg (4 mg Intravenous Given 11/08/23 1334)  azithromycin  (ZITHROMAX )  500 mg in sodium chloride  0.9 % 250 mL IVPB (0 mg Intravenous Stopped 11/08/23 1710)  iohexol  (OMNIPAQUE ) 300 MG/ML solution 100 mL (100 mLs Intravenous Contrast Given 11/08/23 1617)  metoCLOPramide  (REGLAN ) injection 10 mg (10 mg Intravenous Given 11/08/23 1710)     IMPRESSION / MDM / ASSESSMENT AND PLAN / ED COURSE  I reviewed the triage vital signs and the nursing notes.                              Differential diagnosis includes, but is not limited to, gastroenteritis, food poisoning, enteritis, pancreatitis, acute cholecystitis, bowel obstruction  Patient's presentation is most consistent with acute illness / injury with system symptoms.    Medications given: Normal saline 1 L IV, Zofran  4 mg IV  Patient's labs are reassuring, urinalysis does have 20 ketones indicating some dehydration  Gastrointestinal panel shows Campylobacter  Due to the tenderness along the right upper quadrant we will go ahead and order CT abdomen pelvis IV contrast  We did go ahead and give the patient azithromycin  IV, will also give Flagyl   Patient has begun to run a fever here in the ED.  Has had multiple episodes of diarrhea while here.  Still not tolerating p.o.  Will try Reglan .  Has already had Phenergan and Zofran .  Feel that at this time patient would be better off being admitted to the hospital for IV antibiotics.  Also for hydration.  CT abdomen pelvis shows colitis, independent review interpreted by me  Consult hospitalist  Spoke with hospitalist, Dr. Sherre.  She will be admitting the patient.        FINAL CLINICAL IMPRESSION(S) / ED DIAGNOSES   Final diagnoses:  Campylobacter enteritis  Intractable diarrhea     Rx / DC Orders   ED Discharge Orders     None        Note:  This document was prepared using Dragon voice recognition software and may include unintentional dictation errors.    Gasper Devere ORN, PA-C 11/08/23 1722    Clarine Ozell LABOR, MD 11/10/23 [phone]

## 2023-11-08 NOTE — H&P (Signed)
 History and Physical   LEXI CONATY FMW:[id number] DOB: Dec 06, 1965 DOA: 11/08/2023  PCP: Paula Verneita CROME, MD  Outpatient Specialists: Dr. Lady Jensen, rheumatology Patient coming from: Home  I have personally briefly reviewed patient's old medical records in [facility] EMR.  Chief Concern: Nausea, vomiting, diarrhea  HPI: [name] is an 58 year old female with cutaneous sarcoidosis, hyperlipidemia not on statin therapy, insomnia, anxiety, who presents emergency department for chief concerns of intractable nausea and vomiting and diarrhea.  Patient reports symptoms started on Saturday.  Vitals in the ED showed t max of 100.1, respiration 18, heart rate 92, blood pressure 100 on room air.  Serum sodium is 137, potassium 4.8, chloride 102, bicarb 24, BUN of 20, serum creatinine 0.88, EGFR greater than 60, nonfasting blood glucose 104, WBC 4.9, hemoglobin 14.8, platelet 266.  UA was negative for nitrates and leukocytes.  ED treatment: azithromycin  500 mg IV one-time dose, Flagyl  IV one-time dose, Reglan  10 mg IV one-time dose, ondansetron  4 mg IV one-time dose, sodium chloride  1 L bolus. -------------------------------------------- At bedside, patient was able to tell me her first and last name, age, location, current calendar year.  She reports symptoms started on Saturday.  She does not know if she ate anything strange.  She reports that her husband ate what ever she ate.  She denies fever at home, dysuria, hematuria, blood in her stool, blood in her vomitus, coffee-ground emesis.  She denies trauma to her person, syncope, loss of consciousness, swelling of her lower extremities.  Social history: She lives at home with her husband.  She denies tobacco, EtOH, recreational drug use.  She is currently working as a Programmer, applications.  ROS: Constitutional: no weight change, no fever ENT/Mouth: no sore throat, no rhinorrhea Eyes: no eye pain, no vision  changes Cardiovascular: no chest pain, no dyspnea,  no edema, no palpitations Respiratory: no cough, no sputum, no wheezing Gastrointestinal: + nausea, + vomiting, + diarrhea, no constipation Genitourinary: no urinary incontinence, no dysuria, no hematuria Musculoskeletal: no arthralgias, no myalgias Skin: no skin lesions, no pruritus, Neuro: + weakness, no loss of consciousness, no syncope Psych: no anxiety, no depression, + decrease appetite Heme/Lymph: no bruising, no bleeding  ED Course: Discussed with EDP, patient requiring hospitalization for chief concerns of gastro enteritis with intractable nausea and vomiting secondary to Campylobacter.  Assessment/Plan  Principal Problem:   Intractable nausea and vomiting Active Problems:   GERD (gastroesophageal reflux disease)   Insomnia disorder   Depression, major, single episode, mild (HCC)   Cutaneous sarcoidosis   Campylobacter gastroenteritis   Assessment and Plan:  * Intractable nausea and vomiting Symptomatic support with as needed ondansetron  LR 125 mL/h, 1 day ordered Phenergan 12.5 mg IV every 6 hours as needed for refractory nausea and vomiting, 1 day ordered  Campylobacter gastroenteritis Symptomatic with diffuse multiple episodes of diarrhea Continue with azithromycin  500 mg IV daily, 4 additional doses ordered to complete a 5-day course Status post sodium chloride  1 L bolus Continue with IV infusion LR 125 mL/h, 1 day ordered No antidiarrheal medication will be given  Cutaneous sarcoidosis Home hydroxychloroquine  200 mg p.o. twice daily resumed Folic acid  1 mg daily resumed  Depression, major, single episode, mild (HCC) Home bupropion  300 mg daily, citalopram  40 mg daily resumed on admission  Insomnia disorder Melatonin 5 mg nightly as needed for sleep Zolpidem  5 mg nightly as needed for sleep not responsive to melatonin  Chart reviewed.   DVT prophylaxis: Enoxaparin  40 mg subcutaneous  at bedtime Code  Status: Full code Diet: Clear liquid diet with orders to advance as tolerated to regular diet Family Communication: Updated spouse at bedside with patient's permission Disposition Plan: Pending clinical course Consults called: None at this time Admission status: Telemetry medical, observation  Past Medical History:  Diagnosis Date   allergic rhinitis    Cancer (HCC)    skin   Family history of adverse reaction to anesthesia    Daughter - PONV   GERD (gastroesophageal reflux disease)    Wears contact lenses    Past Surgical History:  Procedure Laterality Date   COLONOSCOPY WITH PROPOFOL  N/A 08/23/2019   Procedure: COLONOSCOPY WITH PROPOFOL ;  Surgeon: Jinny Carmine, MD;  Location: [facility];  Service: Endoscopy;  Laterality: N/A;  priority 4   DILATION AND CURETTAGE OF UTERUS  June 2012   [place] , for heavy bleeding    laparoscopy  1996   TONSILLECTOMY AND ADENOIDECTOMY     Social History:  reports that she has quit smoking. Her smoking use included cigarettes. She has never used smokeless tobacco. She reports current alcohol use of about 10.0 standard drinks of alcohol per week. She reports that she does not use drugs.  No Known Allergies Family History  Problem Relation Age of Onset   Hypertension Mother    Atrial fibrillation Father    Cancer Neg Hx    Breast cancer Neg Hx    Family history: Family history reviewed and not pertinent.  Prior to Admission medications   Medication Sig Start Date End Date Taking? Authorizing Provider  ALPRAZolam  (XANAX ) 0.25 MG tablet Take 2 tablets (0.5 mg total) by mouth at bedtime as needed for anxiety. 06/02/23  Yes Paula Verneita CROME, MD  buPROPion  (WELLBUTRIN  XL) 300 MG 24 hr tablet Take 1 tablet (300 mg total) by mouth daily. 06/02/23  Yes Tullo, Teresa L, MD  cetirizine (ZYRTEC) 10 MG tablet Take 10 mg by mouth daily.   Yes [provider]  citalopram  (CELEXA ) 40 MG tablet Take 1 tablet (40 mg total) by mouth daily. 12/02/22  01/07/24 Yes Paula Verneita CROME, MD  Estradiol  (YUVAFEM ) 10 MCG TABS vaginal tablet Insert into vagina  twice weekly 06/02/23  Yes Paula Verneita CROME, MD  folic acid  (FOLVITE ) 1 MG tablet Take 1 tablet (1 mg total) by mouth daily. 02/03/23  Yes   hydroxychloroquine  (PLAQUENIL ) 200 MG tablet Take 1 tablet (200 mg total) by mouth 2 (two) times daily. 08/17/23  Yes Patel, Mayur K, MD  methotrexate  (RHEUMATREX) 2.5 MG tablet Take 5 tablets (12.5 mg total) by mouth every 7 (seven) days all on the same day. 05/02/23  Yes   ondansetron  (ZOFRAN ) 4 MG tablet Take 1 tablet (4 mg total) by mouth every 8 (eight) hours as needed for nausea or vomiting. 10/18/22  Yes Paula Verneita CROME, MD  zolpidem  (AMBIEN  CR) 6.25 MG CR tablet Take 1 tablet (6.25 mg total) by mouth at bedtime as needed for sleep. 06/02/23 11/29/23 Yes Paula Verneita CROME, MD  omeprazole  (PRILOSEC) 40 MG capsule Take 1 capsule (40 mg total) by mouth daily. Patient not taking: Reported on 11/08/2023 06/02/23   Paula Verneita CROME, MD  dicyclomine  (BENTYL ) 20 MG tablet Take 1 tablet (20 mg total) by mouth every 6 (six) hours. 04/04/16 10/03/16  Paula Verneita CROME, MD   Physical Exam: Vitals:   11/08/23 1545 11/08/23 1546 11/08/23 1645 11/08/23 1745  BP: 117/87  119/80 114/84  Pulse: 95  94 87  Resp: 16  16   Temp:  100.1 F (37.8 C)    TempSrc:  Oral    SpO2: 100%  98% 100%  Weight:  68.9 kg    Height:  5' 7 (1.702 m)     Constitutional: appears age-appropriate, mildly uncomfortable, no acute distress Eyes: PERRL, lids and conjunctivae normal ENMT: Mucous membranes are moist. Posterior pharynx clear of any exudate or lesions. Age-appropriate dentition. Hearing appropriate Neck: normal, supple, no masses, no thyromegaly Respiratory: clear to auscultation bilaterally, no wheezing, no crackles. Normal respiratory effort. No accessory muscle use.  Cardiovascular: Regular rate and rhythm, no murmurs / rubs / gallops. No extremity edema. 2+ pedal pulses. No carotid  bruits.  Abdomen: no tenderness, no masses palpated, no hepatosplenomegaly.  Hyperactive bowel sounds.  Musculoskeletal: no clubbing / cyanosis. No joint deformity upper and lower extremities. Good ROM, no contractures, no atrophy. Normal muscle tone.  Skin: no rashes, lesions, ulcers. No induration Neurologic: Sensation intact. Strength 5/5 in all 4.  Psychiatric: Normal judgment and insight. Alert and oriented x 3. Normal mood.   EKG: Not indicated at this time  Chest x-ray on Admission: Not indicated at this time  CT ABDOMEN PELVIS W CONTRAST Result Date: 11/08/2023 CLINICAL DATA:  Acute abdominal pain EXAM: CT ABDOMEN AND PELVIS WITH CONTRAST TECHNIQUE: Multidetector CT imaging of the abdomen and pelvis was performed using the standard protocol following bolus administration of intravenous contrast. RADIATION DOSE REDUCTION: This exam was performed according to the departmental dose-optimization program which includes automated exposure control, adjustment of the mA and/or kV according to patient size and/or use of iterative reconstruction technique. CONTRAST:  OMNIPAQUE  IOHEXOL  300 MG/ML  SOLN COMPARISON:  None Available. FINDINGS: Lower chest: No acute abnormality. Hepatobiliary: No focal liver abnormality is seen. No gallstones, gallbladder wall thickening, or biliary dilatation. Pancreas: Unremarkable. No pancreatic ductal dilatation or surrounding inflammatory changes. Spleen: Normal in size without focal abnormality. Adrenals/Urinary Tract: There is diffuse bladder wall thickening versus normal under distension. The kidneys and adrenal glands are within normal limits. Stomach/Bowel: There is mild diffuse colonic wall thickening without surrounding inflammatory stranding. The appendix is normal in size. There some scattered colonic diverticula. There is no pneumatosis, bowel obstruction or free air. Stomach is within normal limits. Vascular/Lymphatic: No significant vascular findings are  present. No enlarged abdominal or pelvic lymph nodes. Reproductive: Uterus and bilateral adnexa are unremarkable. Other: No abdominal wall hernia or abnormality. No abdominopelvic ascites. Musculoskeletal: Degenerative changes affect the spine. IMPRESSION: 1. Mild diffuse colonic wall thickening worrisome for colitis. 2. Diffuse bladder wall thickening versus normal under distension. Correlate clinically for cystitis. Electronically Signed   By: Greig Pique M.D.   On: 11/08/2023 16:51   Labs on Admission: I have personally reviewed following labs  CBC: Recent Labs  Lab 11/08/23 1200  WBC 4.9  HGB 14.8  HCT 43.8  MCV 85.5  PLT 266   Basic Metabolic Panel: Recent Labs  Lab 11/08/23 1200  NA 137  K 4.8  CL 102  CO2 24  GLUCOSE 104*  BUN 20  CREATININE 0.88  CALCIUM 9.3   GFR: Estimated Creatinine Clearance: 66.9 mL/min (by C-G formula based on SCr of 0.88 mg/dL).  Liver Function Tests: Recent Labs  Lab 11/08/23 1200  AST 16  ALT 16  ALKPHOS 64  BILITOT 0.6  PROT 7.2  ALBUMIN 3.9   Recent Labs  Lab 11/08/23 1200  LIPASE 36   Urine analysis:    Component Value Date/Time   COLORURINE YELLOW (A)  11/08/2023 1238   APPEARANCEUR HAZY (A) 11/08/2023 1238   LABSPEC 1.026 11/08/2023 1238   PHURINE 5.0 11/08/2023 1238   GLUCOSEU NEGATIVE 11/08/2023 1238   HGBUR SMALL (A) 11/08/2023 1238   BILIRUBINUR NEGATIVE 11/08/2023 1238   KETONESUR 20 (A) 11/08/2023 1238   PROTEINUR 100 (A) 11/08/2023 1238   NITRITE NEGATIVE 11/08/2023 1238   LEUKOCYTESUR NEGATIVE 11/08/2023 1238   This document was prepared using Dragon Voice Recognition software and may include unintentional dictation errors.  Dr. Sherre [facility]  If 7PM-7AM, please contact overnight-coverage provider If 7AM-7PM, please contact day attending provider [email or web address]  11/08/2023, 7:32 PM

## 2023-11-08 NOTE — Assessment & Plan Note (Signed)
 Symptomatic with diffuse multiple episodes of diarrhea Continue with azithromycin  500 mg IV daily, 4 additional doses ordered to complete a 5-day course Status post sodium chloride  1 L bolus Continue with IV infusion LR 125 mL/h, 1 day ordered No antidiarrheal medication will be given

## 2023-11-08 NOTE — Assessment & Plan Note (Signed)
 Home bupropion  300 mg daily, citalopram  40 mg daily resumed on admission

## 2023-11-09 DIAGNOSIS — R112 Nausea with vomiting, unspecified: Secondary | ICD-10-CM | POA: Diagnosis not present

## 2023-11-09 LAB — CBC
HCT: 36.3 % (ref 36.0–46.0)
Hemoglobin: 11.9 g/dL — ABNORMAL LOW (ref 12.0–15.0)
MCH: 28.4 pg (ref 26.0–34.0)
MCHC: 32.8 g/dL (ref 30.0–36.0)
MCV: 86.6 fL (ref 80.0–100.0)
Platelets: 210 K/uL (ref 150–400)
RBC: 4.19 MIL/uL (ref 3.87–5.11)
RDW: 13.8 % (ref 11.5–15.5)
WBC: 4.5 K/uL (ref 4.0–10.5)
nRBC: 0 % (ref 0.0–0.2)

## 2023-11-09 LAB — BASIC METABOLIC PANEL WITH GFR
Anion gap: 6 (ref 5–15)
BUN: 11 mg/dL (ref 6–20)
CO2: 27 mmol/L (ref 22–32)
Calcium: 8.5 mg/dL — ABNORMAL LOW (ref 8.9–10.3)
Chloride: 107 mmol/L (ref 98–111)
Creatinine, Ser: 0.77 mg/dL (ref 0.44–1.00)
GFR, Estimated: >60 mL/min (ref >60)
Glucose, Bld: 96 mg/dL (ref 70–99)
Potassium: 3.9 mmol/L (ref 3.5–5.1)
Sodium: 140 mmol/L (ref 135–145)

## 2023-11-09 MED ORDER — ZINC OXIDE 40 % EX OINT
TOPICAL_OINTMENT | Freq: Every day | CUTANEOUS | Status: DC | PRN
  Filled 2023-11-09: qty 113

## 2023-11-09 MED ORDER — AZITHROMYCIN 500 MG PO TABS
500.0000 mg | ORAL_TABLET | Freq: Every day | ORAL | Status: DC
  Administered 2023-11-10: 500 mg via ORAL
  Filled 2023-11-09: qty 1

## 2023-11-09 NOTE — Plan of Care (Signed)
  Problem: Nutrition: Goal: Adequate nutrition will be maintained Outcome: Progressing   Problem: Activity: Goal: Risk for activity intolerance will decrease Outcome: Progressing   Problem: Pain Managment: Goal: General experience of comfort will improve and/or be controlled Outcome: Progressing   Problem: Safety: Goal: Ability to remain free from injury will improve Outcome: Progressing   Problem: Safety: Goal: Ability to remain free from injury will improve Outcome: Progressing   Problem: Skin Integrity: Goal: Risk for impaired skin integrity will decrease Outcome: Progressing

## 2023-11-09 NOTE — Progress Notes (Signed)
 PROGRESS NOTE    Paula Jensen  FMW:[id number] DOB: 07/31/1965 DOA: 11/08/2023 PCP: Marylynn Verneita CROME, MD   Brief Narrative:   Paula Jensen is an 58 year old female with cutaneous sarcoidosis, hyperlipidemia not on statin therapy, insomnia, anxiety, who presents emergency department for chief concerns of intractable nausea and vomiting and diarrhea, symptoms started Saturday.  She was diagnosed with Campylobacter gastroenteritis as well as colitis and admitted under hospital service.  Assessment & Plan:   Principal Problem:   Intractable nausea and vomiting Active Problems:   GERD (gastroesophageal reflux disease)   Insomnia disorder   Depression, major, single episode, mild (HCC)   Cutaneous sarcoidosis   Campylobacter gastroenteritis  Intractable nausea vomiting and diarrhea secondary to gastroenteritis and colitis secondary to Campylobacter infection, POA: Symptoms have improved slightly.  No diarrhea has been documented since admission to the ED however patient states that she has had at least 15-20 loose bowel movements but denies any blood in stool.  She is afebrile with no leukocytosis.  Continue azithromycin  as well as supportive care.  I advance her diet to full liquid this morning and she had good breakfast and no nausea or vomiting so now I will advance her to soft diet.  Cutaneous sarcoidosis Home hydroxychloroquine  200 mg p.o. twice daily resumed Folic acid  1 mg daily resumed   Depression, major, single episode, mild (HCC) Home bupropion  300 mg daily, citalopram  40 mg daily resumed on admission   Insomnia disorder Melatonin 5 mg nightly as needed for sleep Zolpidem  5 mg nightly as needed for sleep not responsive to melatonin  DVT prophylaxis: enoxaparin  (LOVENOX ) injection 40 mg Start: 11/08/23 2200 Place TED hose Start: 11/08/23 1719   Code Status: Full Code  Family Communication:  None present at bedside.  Plan of care discussed with patient in length  and he/she verbalized understanding and agreed with it.  Status is: Observation The patient will require care spanning > 2 midnights and should be moved to inpatient because: Still with significant diarrhea frequency   Estimated body mass index is 23.81 kg/m as calculated from the following:   Height as of this encounter: 5' 7 (1.702 m).   Weight as of this encounter: 68.9 kg.    Nutritional Assessment: Body mass index is 23.81 kg/m.SABRA Seen by dietician.  I agree with the assessment and plan as outlined below: Nutrition Status:        . Skin Assessment: I have examined the patient's skin and I agree with the wound assessment as performed by the wound care RN as outlined below:    Consultants:  None  Procedures:  None  Antimicrobials:  Anti-infectives (From admission, onward)    Start     Dose/Rate Route Frequency Ordered Stop   11/09/23 1000  azithromycin  (ZITHROMAX ) 500 mg in sodium chloride  0.9 % 250 mL IVPB        500 mg 250 mL/hr over 60 Minutes Intravenous Every 24 hours 11/08/23 1929 11/13/23 0959   11/08/23 2200  hydroxychloroquine  (PLAQUENIL ) tablet 200 mg        200 mg Oral 2 times daily 11/08/23 1719     11/08/23 1715  metroNIDAZOLE  (FLAGYL ) IVPB 500 mg        500 mg 100 mL/hr over 60 Minutes Intravenous  Once 11/08/23 1701 11/08/23 1819   11/08/23 1445  azithromycin  (ZITHROMAX ) 500 mg in sodium chloride  0.9 % 250 mL IVPB        500 mg 250 mL/hr over 60 Minutes Intravenous  Once 11/08/23 1434 11/08/23 1710         Subjective: Patient seen and examined, other than having profuse diarrhea with fairly frequent episodes, she has no other complaint, she denies any abdominal pain, nausea or vomiting.  Objective: Vitals:   11/09/23 0352 11/09/23 0639 11/09/23 0831 11/09/23 0834  BP: 90/65  110/66 113/75  Pulse: 73  74 80  Resp: 14  15 13   Temp:  98.1 F (36.7 C) 98.1 F (36.7 C)   TempSrc:  Oral    SpO2: 97%  98% 100%  Weight:      Height:         Intake/Output Summary (Last 24 hours) at 11/09/2023 0849 Last data filed at 11/08/2023 1819 Gross per 24 hour  Intake 1350 ml  Output --  Net 1350 ml   Filed Weights   11/08/23 1157 11/08/23 1546  Weight: 68.9 kg 68.9 kg    Examination:  General exam: Appears calm and comfortable  Respiratory system: Clear to auscultation. Respiratory effort normal. Cardiovascular system: S1 & S2 heard, RRR. No JVD, murmurs, rubs, gallops or clicks. No pedal edema. Gastrointestinal system: Abdomen is nondistended, soft and nontender. No organomegaly or masses felt. Normal bowel sounds heard. Central nervous system: Alert and oriented. No focal neurological deficits. Extremities: Symmetric 5 x 5 power. Skin: No rashes, lesions or ulcers Psychiatry: Judgement and insight appear normal. Mood & affect appropriate.    Data Reviewed: I have personally reviewed following labs and imaging studies  CBC: Recent Labs  Lab 11/08/23 1200 11/09/23 0607  WBC 4.9 4.5  HGB 14.8 11.9*  HCT 43.8 36.3  MCV 85.5 86.6  PLT 266 210   Basic Metabolic Panel: Recent Labs  Lab 11/08/23 1200 11/09/23 0607  NA 137 140  K 4.8 3.9  CL 102 107  CO2 24 27  GLUCOSE 104* 96  BUN 20 11  CREATININE 0.88 0.77  CALCIUM 9.3 8.5*   GFR: Estimated Creatinine Clearance: 73.6 mL/min (by C-G formula based on SCr of 0.77 mg/dL). Liver Function Tests: Recent Labs  Lab 11/08/23 1200  AST 16  ALT 16  ALKPHOS 64  BILITOT 0.6  PROT 7.2  ALBUMIN 3.9   Recent Labs  Lab 11/08/23 1200  LIPASE 36   No results for input(s): AMMONIA in the last 168 hours. Coagulation Profile: No results for input(s): INR, PROTIME in the last 168 hours. Cardiac Enzymes: No results for input(s): CKTOTAL, CKMB, CKMBINDEX, TROPONINI in the last 168 hours. BNP (last 3 results) No results for input(s): PROBNP in the last 8760 hours. HbA1C: No results for input(s): HGBA1C in the last 72 hours. CBG: No results for  input(s): GLUCAP in the last 168 hours. Lipid Profile: No results for input(s): CHOL, HDL, LDLCALC, TRIG, CHOLHDL, LDLDIRECT in the last 72 hours. Thyroid  Function Tests: No results for input(s): TSH, T4TOTAL, FREET4, T3FREE, THYROIDAB in the last 72 hours. Anemia Panel: No results for input(s): VITAMINB12, FOLATE, FERRITIN, TIBC, IRON, RETICCTPCT in the last 72 hours. Sepsis Labs: No results for input(s): PROCALCITON, LATICACIDVEN in the last 168 hours.  Recent Results (from the past 240 hours)  Gastrointestinal Panel by PCR , Stool     Status: Abnormal   Collection Time: 11/08/23 12:09 PM   Specimen: Stool  Result Value Ref Range Status   Campylobacter species DETECTED (A) NOT DETECTED Final    Comment: RESULT CALLED TO, READ BACK BY AND VERIFIED WITH: Ozell Klein 11/08/23 @ 1424 TDL    Plesimonas shigelloides NOT DETECTED  NOT DETECTED Final   Salmonella species NOT DETECTED NOT DETECTED Final   Yersinia enterocolitica NOT DETECTED NOT DETECTED Final   Vibrio species NOT DETECTED NOT DETECTED Final   Vibrio cholerae NOT DETECTED NOT DETECTED Final   Enteroaggregative E coli (EAEC) NOT DETECTED NOT DETECTED Final   Enteropathogenic E coli (EPEC) NOT DETECTED NOT DETECTED Final   Enterotoxigenic E coli (ETEC) NOT DETECTED NOT DETECTED Final   Shiga like toxin producing E coli (STEC) NOT DETECTED NOT DETECTED Final   Shigella/Enteroinvasive E coli (EIEC) NOT DETECTED NOT DETECTED Final   Cryptosporidium NOT DETECTED NOT DETECTED Final   Cyclospora cayetanensis NOT DETECTED NOT DETECTED Final   Entamoeba histolytica NOT DETECTED NOT DETECTED Final   Giardia lamblia NOT DETECTED NOT DETECTED Final   Adenovirus F40/41 NOT DETECTED NOT DETECTED Final   Astrovirus NOT DETECTED NOT DETECTED Final   Norovirus GI/GII NOT DETECTED NOT DETECTED Final   Rotavirus A NOT DETECTED NOT DETECTED Final   Sapovirus (I, II, IV, and V) NOT DETECTED NOT DETECTED  Final    Comment: Performed at [facility], [address] [place], [place] [address]     Radiology Studies: CT ABDOMEN PELVIS W CONTRAST Result Date: 11/08/2023 CLINICAL DATA:  Acute abdominal pain EXAM: CT ABDOMEN AND PELVIS WITH CONTRAST TECHNIQUE: Multidetector CT imaging of the abdomen and pelvis was performed using the standard protocol following bolus administration of intravenous contrast. RADIATION DOSE REDUCTION: This exam was performed according to the departmental dose-optimization program which includes automated exposure control, adjustment of the mA and/or kV according to patient size and/or use of iterative reconstruction technique. CONTRAST:  OMNIPAQUE  IOHEXOL  300 MG/ML  SOLN COMPARISON:  None Available. FINDINGS: Lower chest: No acute abnormality. Hepatobiliary: No focal liver abnormality is seen. No gallstones, gallbladder wall thickening, or biliary dilatation. Pancreas: Unremarkable. No pancreatic ductal dilatation or surrounding inflammatory changes. Spleen: Normal in size without focal abnormality. Adrenals/Urinary Tract: There is diffuse bladder wall thickening versus normal under distension. The kidneys and adrenal glands are within normal limits. Stomach/Bowel: There is mild diffuse colonic wall thickening without surrounding inflammatory stranding. The appendix is normal in size. There some scattered colonic diverticula. There is no pneumatosis, bowel obstruction or free air. Stomach is within normal limits. Vascular/Lymphatic: No significant vascular findings are present. No enlarged abdominal or pelvic lymph nodes. Reproductive: Uterus and bilateral adnexa are unremarkable. Other: No abdominal wall hernia or abnormality. No abdominopelvic ascites. Musculoskeletal: Degenerative changes affect the spine. IMPRESSION: 1. Mild diffuse colonic wall thickening worrisome for colitis. 2. Diffuse bladder wall thickening versus normal under distension. Correlate clinically  for cystitis. Electronically Signed   By: Greig Pique M.D.   On: 11/08/2023 16:51    Scheduled Meds:  buPROPion   300 mg Oral Daily   citalopram   40 mg Oral Daily   enoxaparin  (LOVENOX ) injection  40 mg Subcutaneous QHS   folic acid   1 mg Oral Daily   hydroxychloroquine   200 mg Oral BID   Continuous Infusions:  azithromycin      lactated ringers  125 mL/hr at 11/08/23 1825   promethazine (PHENERGAN) injection (IM or IVPB)       LOS: 0 days   Fredia Skeeter, MD [facility]  11/09/2023, 8:49 AM   *Please note that this is a verbal dictation therefore any spelling or grammatical errors are due to the Dragon Medical One system interpretation.  Please page via Amion and do not message via secure chat for urgent patient care matters. Secure chat can  be used for non urgent patient care matters.  How to contact the [facility] Attending or Consulting provider 7A - 7P or covering provider during after hours 7P -7A, for this patient?  Check the care team in [facility] and look for a) attending/consulting [facility] provider listed and b) the [facility] team listed. Page or secure chat 7A-7P. Log into [email or web address] and use [facility] universal password to access. If you do not have the password, please contact the hospital operator. Locate the [facility] provider you are looking for under [facility] and page to a number that you can be directly reached. If you still have difficulty reaching the provider, please page the [facility] (Director on Call) for the Hospitalists listed on amion for assistance.

## 2023-11-10 ENCOUNTER — Other Ambulatory Visit: Payer: Self-pay

## 2023-11-10 DIAGNOSIS — R112 Nausea with vomiting, unspecified: Secondary | ICD-10-CM | POA: Diagnosis not present

## 2023-11-10 LAB — CBC WITH DIFFERENTIAL/PLATELET
Abs Immature Granulocytes: 0.16 K/uL — ABNORMAL HIGH (ref 0.00–0.07)
Basophils Absolute: 0.1 K/uL (ref 0.0–0.1)
Basophils Relative: 1 %
Eosinophils Absolute: 0.2 K/uL (ref 0.0–0.5)
Eosinophils Relative: 3 %
HCT: 37.6 % (ref 36.0–46.0)
Hemoglobin: 12.4 g/dL (ref 12.0–15.0)
Immature Granulocytes: 3 %
Lymphocytes Relative: 44 %
Lymphs Abs: 2.4 K/uL (ref 0.7–4.0)
MCH: 28.3 pg (ref 26.0–34.0)
MCHC: 33 g/dL (ref 30.0–36.0)
MCV: 85.8 fL (ref 80.0–100.0)
Monocytes Absolute: 0.6 K/uL (ref 0.1–1.0)
Monocytes Relative: 11 %
Neutro Abs: 2.1 K/uL (ref 1.7–7.7)
Neutrophils Relative %: 38 %
Platelets: 244 K/uL (ref 150–400)
RBC: 4.38 MIL/uL (ref 3.87–5.11)
RDW: 13.7 % (ref 11.5–15.5)
Smear Review: NORMAL
WBC: 5.5 K/uL (ref 4.0–10.5)
nRBC: 0 % (ref 0.0–0.2)

## 2023-11-10 LAB — BASIC METABOLIC PANEL WITH GFR
Anion gap: 6 (ref 5–15)
BUN: 6 mg/dL (ref 6–20)
CO2: 28 mmol/L (ref 22–32)
Calcium: 8.4 mg/dL — ABNORMAL LOW (ref 8.9–10.3)
Chloride: 107 mmol/L (ref 98–111)
Creatinine, Ser: 0.73 mg/dL (ref 0.44–1.00)
GFR, Estimated: >60 mL/min (ref >60)
Glucose, Bld: 99 mg/dL (ref 70–99)
Potassium: 3.5 mmol/L (ref 3.5–5.1)
Sodium: 141 mmol/L (ref 135–145)

## 2023-11-10 MED ORDER — AZITHROMYCIN 500 MG PO TABS
500.0000 mg | ORAL_TABLET | Freq: Every day | ORAL | 0 refills | Status: AC
Start: 2023-11-10 — End: 2023-11-14
  Filled 2023-11-10: qty 3, 3d supply, fill #0

## 2023-11-10 NOTE — Discharge Summary (Signed)
 Physician Discharge Summary  Paula Jensen FMW:[id number] DOB: 1965-08-18 DOA: 11/08/2023  PCP: Marylynn Verneita CROME, MD  Admit date: 11/08/2023 Discharge date: 11/10/2023    Admitted From: Home Disposition: Home  Recommendations for Outpatient Follow-up:  Follow up with PCP in 1-2 weeks Please obtain BMP/CBC in one week Please follow up with your PCP on the following pending results: Unresulted Labs (From admission, onward)    None         Home Health: None Equipment/Devices: None  Discharge Condition: Stable CODE STATUS: Full code Diet recommendation:  Diet Order             DIET DYS 3 Room service appropriate? Yes; Fluid consistency: Thin  Diet effective now                   Subjective: Seen and examined.  She says that she had several episodes of diarrhea up until last night however since last night, she has had 2-3 bowel movements this morning and they are now more formed.  She has no other complaint.  She has tolerated soft diet for the breakfast this morning.  She is very eager to go home.  Discussed with her in length that due to the fact that she has had more than 5 or 6 loose bowel movements in last 24 hours, this puts her at risk of dehydration if she continues to have diarrhea.  She verbalized understanding and feels confident that her diarrhea is better and she can hydrate herself well.  She wants to go home as she has a planned trip to the beach today.  She takes full responsibility of the consequences.  Brief/Interim Summary: [name] is an 58 year old female with cutaneous sarcoidosis, hyperlipidemia not on statin therapy, insomnia, anxiety, who presented to emergency department for chief concerns of intractable nausea and vomiting and diarrhea, symptoms started Saturday.  She was diagnosed with Campylobacter gastroenteritis as well as colitis and admitted under hospital service.  Patient was started with IV hydration as well as azithromycin  per  guidelines.  Patient symptoms have improved, tolerated soft diet this morning.  Diarrhea is improving.  She wants to go home.  She received 2 doses of azithromycin , sending home on 3 more doses.   Cutaneous sarcoidosis Home hydroxychloroquine  200 mg p.o. twice daily resumed Folic acid  1 mg daily resumed   Depression, major, single episode, mild (HCC) Home bupropion  300 mg daily, citalopram  40 mg daily resumed on admission   Insomnia disorder Melatonin 5 mg nightly as needed for sleep Zolpidem  5 mg nightly as needed for sleep not responsive to melatonin  Discharge plan was discussed with patient and/or family member and they verbalized understanding and agreed with it.  Discharge Diagnoses:  Principal Problem:   Intractable nausea and vomiting Active Problems:   GERD (gastroesophageal reflux disease)   Insomnia disorder   Depression, major, single episode, mild (HCC)   Cutaneous sarcoidosis   Campylobacter gastroenteritis    Discharge Instructions   Allergies as of 11/10/2023   No Known Allergies      Medication List     STOP taking these medications    omeprazole  40 MG capsule Commonly known as: PRILOSEC       TAKE these medications    ALPRAZolam  0.25 MG tablet Commonly known as: XANAX  Take 2 tablets (0.5 mg total) by mouth at bedtime as needed for anxiety.   azithromycin  500 MG tablet Commonly known as: ZITHROMAX  Take 1 tablet (500 mg total) by mouth  daily for 3 days.   buPROPion  300 MG 24 hr tablet Commonly known as: WELLBUTRIN  XL Take 1 tablet (300 mg total) by mouth daily.   cetirizine 10 MG tablet Commonly known as: ZYRTEC Take 10 mg by mouth daily.   citalopram  40 MG tablet Commonly known as: CELEXA  Take 1 tablet (40 mg total) by mouth daily.   Estradiol  10 MCG Tabs vaginal tablet Commonly known as: Yuvafem  Insert 1 tablet into vagina twice weekly (Insert into vagina  twice weekly)   folic acid  1 MG tablet Commonly known as: FOLVITE  Take  1 tablet (1 mg total) by mouth daily.   hydroxychloroquine  200 MG tablet Commonly known as: PLAQUENIL  Take 1 tablet (200 mg total) by mouth 2 (two) times daily.   methotrexate  2.5 MG tablet Commonly known as: RHEUMATREX Take 5 tablets (12.5 mg total) by mouth every 7 (seven) days all on the same day.   ondansetron  4 MG tablet Commonly known as: Zofran  Take 1 tablet (4 mg total) by mouth every 8 (eight) hours as needed for nausea or vomiting.   zolpidem  6.25 MG CR tablet Commonly known as: AMBIEN  CR Take 1 tablet (6.25 mg total) by mouth at bedtime as needed for sleep.        Follow-up Information     Marylynn Verneita CROME, MD Follow up in 1 week(s).   Specialty: Internal Medicine Contact information: [address] [place] [address] [phone]                No Known Allergies  Consultations: None   Procedures/Studies: CT ABDOMEN PELVIS W CONTRAST Result Date: 11/08/2023 CLINICAL DATA:  Acute abdominal pain EXAM: CT ABDOMEN AND PELVIS WITH CONTRAST TECHNIQUE: Multidetector CT imaging of the abdomen and pelvis was performed using the standard protocol following bolus administration of intravenous contrast. RADIATION DOSE REDUCTION: This exam was performed according to the departmental dose-optimization program which includes automated exposure control, adjustment of the mA and/or kV according to patient size and/or use of iterative reconstruction technique. CONTRAST:  OMNIPAQUE  IOHEXOL  300 MG/ML  SOLN COMPARISON:  None Available. FINDINGS: Lower chest: No acute abnormality. Hepatobiliary: No focal liver abnormality is seen. No gallstones, gallbladder wall thickening, or biliary dilatation. Pancreas: Unremarkable. No pancreatic ductal dilatation or surrounding inflammatory changes. Spleen: Normal in size without focal abnormality. Adrenals/Urinary Tract: There is diffuse bladder wall thickening versus normal under distension. The kidneys and adrenal  glands are within normal limits. Stomach/Bowel: There is mild diffuse colonic wall thickening without surrounding inflammatory stranding. The appendix is normal in size. There some scattered colonic diverticula. There is no pneumatosis, bowel obstruction or free air. Stomach is within normal limits. Vascular/Lymphatic: No significant vascular findings are present. No enlarged abdominal or pelvic lymph nodes. Reproductive: Uterus and bilateral adnexa are unremarkable. Other: No abdominal wall hernia or abnormality. No abdominopelvic ascites. Musculoskeletal: Degenerative changes affect the spine. IMPRESSION: 1. Mild diffuse colonic wall thickening worrisome for colitis. 2. Diffuse bladder wall thickening versus normal under distension. Correlate clinically for cystitis. Electronically Signed   By: Greig Pique M.D.   On: 11/08/2023 16:51     Discharge Exam: Vitals:   11/10/23 0520 11/10/23 0851  BP: 98/79 102/77  Pulse: 73 68  Resp: 16 18  Temp: 98.2 F (36.8 C) 98.3 F (36.8 C)  SpO2: 99% 100%   Vitals:   11/09/23 1415 11/09/23 2039 11/10/23 0520 11/10/23 0851  BP: 118/87 109/80 98/79 102/77  Pulse: 75 74 73 68  Resp: 18 16  16 18  Temp: 98.7 F (37.1 C) 98.3 F (36.8 C) 98.2 F (36.8 C) 98.3 F (36.8 C)  TempSrc:    Oral  SpO2: 99% 98% 99% 100%  Weight:      Height:        General: Pt is alert, awake, not in acute distress Cardiovascular: RRR, S1/S2 +, no rubs, no gallops Respiratory: CTA bilaterally, no wheezing, no rhonchi Abdominal: Soft, NT, ND, bowel sounds + Extremities: no edema, no cyanosis    The results of significant diagnostics from this hospitalization (including imaging, microbiology, ancillary and laboratory) are listed below for reference.     Microbiology: Recent Results (from the past 240 hours)  Gastrointestinal Panel by PCR , Stool     Status: Abnormal   Collection Time: 11/08/23 12:09 PM   Specimen: Stool  Result Value Ref Range Status    Campylobacter species DETECTED (A) NOT DETECTED Final    Comment: RESULT CALLED TO, READ BACK BY AND VERIFIED WITH: Ozell Klein 11/08/23 @ 1424 TDL    Plesimonas shigelloides NOT DETECTED NOT DETECTED Final   Salmonella species NOT DETECTED NOT DETECTED Final   Yersinia enterocolitica NOT DETECTED NOT DETECTED Final   Vibrio species NOT DETECTED NOT DETECTED Final   Vibrio cholerae NOT DETECTED NOT DETECTED Final   Enteroaggregative E coli (EAEC) NOT DETECTED NOT DETECTED Final   Enteropathogenic E coli (EPEC) NOT DETECTED NOT DETECTED Final   Enterotoxigenic E coli (ETEC) NOT DETECTED NOT DETECTED Final   Shiga like toxin producing E coli (STEC) NOT DETECTED NOT DETECTED Final   Shigella/Enteroinvasive E coli (EIEC) NOT DETECTED NOT DETECTED Final   Cryptosporidium NOT DETECTED NOT DETECTED Final   Cyclospora cayetanensis NOT DETECTED NOT DETECTED Final   Entamoeba histolytica NOT DETECTED NOT DETECTED Final   Giardia lamblia NOT DETECTED NOT DETECTED Final   Adenovirus F40/41 NOT DETECTED NOT DETECTED Final   Astrovirus NOT DETECTED NOT DETECTED Final   Norovirus GI/GII NOT DETECTED NOT DETECTED Final   Rotavirus A NOT DETECTED NOT DETECTED Final   Sapovirus (I, II, IV, and V) NOT DETECTED NOT DETECTED Final    Comment: Performed at [facility], [address] [place], [place] [address]     Labs: BNP (last 3 results) No results for input(s): BNP in the last 8760 hours. Basic Metabolic Panel: Recent Labs  Lab 11/08/23 1200 11/09/23 0607 11/10/23 0447  NA 137 140 141  K 4.8 3.9 3.5  CL 102 107 107  CO2 24 27 28   GLUCOSE 104* 96 99  BUN 20 11 6   CREATININE 0.88 0.77 0.73  CALCIUM 9.3 8.5* 8.4*   Liver Function Tests: Recent Labs  Lab 11/08/23 1200  AST 16  ALT 16  ALKPHOS 64  BILITOT 0.6  PROT 7.2  ALBUMIN 3.9   Recent Labs  Lab 11/08/23 1200  LIPASE 36   No results for input(s): AMMONIA in the last 168 hours. CBC: Recent Labs  Lab  11/08/23 1200 11/09/23 0607 11/10/23 0447  WBC 4.9 4.5 5.5  NEUTROABS  --   --  2.1  HGB 14.8 11.9* 12.4  HCT 43.8 36.3 37.6  MCV 85.5 86.6 85.8  PLT 266 210 244   Cardiac Enzymes: No results for input(s): CKTOTAL, CKMB, CKMBINDEX, TROPONINI in the last 168 hours. BNP: Invalid input(s): POCBNP CBG: No results for input(s): GLUCAP in the last 168 hours. D-Dimer No results for input(s): DDIMER in the last 72 hours. Hgb A1c No results for input(s): HGBA1C in  the last 72 hours. Lipid Profile No results for input(s): CHOL, HDL, LDLCALC, TRIG, CHOLHDL, LDLDIRECT in the last 72 hours. Thyroid  function studies No results for input(s): TSH, T4TOTAL, T3FREE, THYROIDAB in the last 72 hours.  Invalid input(s): FREET3 Anemia work up No results for input(s): VITAMINB12, FOLATE, FERRITIN, TIBC, IRON, RETICCTPCT in the last 72 hours. Urinalysis    Component Value Date/Time   COLORURINE YELLOW (A) 11/08/2023 1238   APPEARANCEUR HAZY (A) 11/08/2023 1238   LABSPEC 1.026 11/08/2023 1238   PHURINE 5.0 11/08/2023 1238   GLUCOSEU NEGATIVE 11/08/2023 1238   HGBUR SMALL (A) 11/08/2023 1238   BILIRUBINUR NEGATIVE 11/08/2023 1238   KETONESUR 20 (A) 11/08/2023 1238   PROTEINUR 100 (A) 11/08/2023 1238   NITRITE NEGATIVE 11/08/2023 1238   LEUKOCYTESUR NEGATIVE 11/08/2023 1238   Sepsis Labs Recent Labs  Lab 11/08/23 1200 11/09/23 0607 11/10/23 0447  WBC 4.9 4.5 5.5   Microbiology Recent Results (from the past 240 hours)  Gastrointestinal Panel by PCR , Stool     Status: Abnormal   Collection Time: 11/08/23 12:09 PM   Specimen: Stool  Result Value Ref Range Status   Campylobacter species DETECTED (A) NOT DETECTED Final    Comment: RESULT CALLED TO, READ BACK BY AND VERIFIED WITH: Ozell Klein 11/08/23 @ 1424 TDL    Plesimonas shigelloides NOT DETECTED NOT DETECTED Final   Salmonella species NOT DETECTED NOT DETECTED Final   Yersinia  enterocolitica NOT DETECTED NOT DETECTED Final   Vibrio species NOT DETECTED NOT DETECTED Final   Vibrio cholerae NOT DETECTED NOT DETECTED Final   Enteroaggregative E coli (EAEC) NOT DETECTED NOT DETECTED Final   Enteropathogenic E coli (EPEC) NOT DETECTED NOT DETECTED Final   Enterotoxigenic E coli (ETEC) NOT DETECTED NOT DETECTED Final   Shiga like toxin producing E coli (STEC) NOT DETECTED NOT DETECTED Final   Shigella/Enteroinvasive E coli (EIEC) NOT DETECTED NOT DETECTED Final   Cryptosporidium NOT DETECTED NOT DETECTED Final   Cyclospora cayetanensis NOT DETECTED NOT DETECTED Final   Entamoeba histolytica NOT DETECTED NOT DETECTED Final   Giardia lamblia NOT DETECTED NOT DETECTED Final   Adenovirus F40/41 NOT DETECTED NOT DETECTED Final   Astrovirus NOT DETECTED NOT DETECTED Final   Norovirus GI/GII NOT DETECTED NOT DETECTED Final   Rotavirus A NOT DETECTED NOT DETECTED Final   Sapovirus (I, II, IV, and V) NOT DETECTED NOT DETECTED Final    Comment: Performed at [facility], [address] [place], [place] [address]    FURTHER DISCHARGE INSTRUCTIONS:   Get Medicines reviewed and adjusted: Please take all your medications with you for your next visit with your Primary MD   Laboratory/radiological data: Please request your Primary MD to go over all hospital tests and procedure/radiological results at the follow up, please ask your Primary MD to get all Hospital records sent to his/her office.   In some cases, they will be blood work, cultures and biopsy results pending at the time of your discharge. Please request that your primary care M.D. goes through all the records of your hospital data and follows up on these results.   Also Note the following: If you experience worsening of your admission symptoms, develop shortness of breath, life threatening emergency, suicidal or homicidal thoughts you must seek medical attention immediately by calling 911 or calling your MD  immediately  if symptoms less severe.   You must read complete instructions/literature along with all the possible adverse reactions/side effects for all the Medicines you  take and that have been prescribed to you. Take any new Medicines after you have completely understood and accpet all the possible adverse reactions/side effects.    patient was instructed, not to drive, operate heavy machinery, perform activities at heights, swimming or participation in water  activities or provide baby-sitting services while on Pain, Sleep and Anxiety Medications; until their outpatient Physician has advised to do so again. Also recommended to not to take more than prescribed Pain, Sleep and Anxiety Medications.  It is not advisable to combine anxiety, sleep and pain medications without talking with your primary care provider.     Wear Seat belts while driving.   Please note: You were cared for by a hospitalist during your hospital stay. Once you are discharged, your primary care physician will handle any further medical issues. Please note that NO REFILLS for any discharge medications will be authorized once you are discharged, as it is imperative that you return to your primary care physician (or establish a relationship with a primary care physician if you do not have one) for your post hospital discharge needs so that they can reassess your need for medications and monitor your lab values  Time coordinating discharge: Over 30 minutes  SIGNED:   Fredia Skeeter, MD  [facility] 11/10/2023, 10:52 AM *Please note that this is a verbal dictation therefore any spelling or grammatical errors are due to the Dragon Medical One system interpretation. If 7PM-7AM, please contact night-coverage [email or web address]

## 2023-11-10 NOTE — Progress Notes (Signed)
 Pt's DC instructions given. Pt is ready to leave and belongings returned.

## 2023-11-13 ENCOUNTER — Telehealth: Payer: Self-pay

## 2023-11-13 NOTE — Transitions of Care (Post Inpatient/ED Visit) (Unsigned)
   11/13/2023  Name: Paula Jensen MRN: [id number] DOB: Jun 04, 1965  Today's TOC FU Call Status: Today's TOC FU Call Status:: Unsuccessful Call (1st Attempt) Unsuccessful Call (1st Attempt) Date: 11/13/23  Attempted to reach the patient regarding the most recent Inpatient/ED visit.  Follow Up Plan: Additional outreach attempts will be made to reach the patient to complete the Transitions of Care (Post Inpatient/ED visit) call.   Signature Julian Lemmings, LPN [place] Nurse Health Advisor Direct Dial [phone]

## 2023-11-14 NOTE — Transitions of Care (Post Inpatient/ED Visit) (Signed)
 11/14/2023  Name: Paula Jensen MRN: [id number] DOB: 12-04-1965  Today's TOC FU Call Status: Today's TOC FU Call Status:: Successful TOC FU Call Completed Unsuccessful Call (1st Attempt) Date: 11/13/23 [facility] FU Call Complete Date: 11/14/23 Patient's Name and Date of Birth confirmed.  Transition Care Management Follow-up Telephone Call Date of Discharge: 11/10/23 Discharge Facility: [facility]) Type of Discharge: Inpatient Admission Primary Inpatient Discharge Diagnosis:: nausea. vomiting How have you been since you were released from the hospital?: Better Any questions or concerns?: No  Items Reviewed: Did you receive and understand the discharge instructions provided?: Yes Medications obtained,verified, and reconciled?: Yes (Medications Reviewed) Any new allergies since your discharge?: No Dietary orders reviewed?: Yes Do you have support at home?: Yes People in Home [RPT]: spouse  Medications Reviewed Today: Medications Reviewed Today     Reviewed by Emmitt Pan, LPN (Licensed Practical Nurse) on 11/14/23 at 1257  Med List Status: <None>   Medication Order Taking? Sig Documenting Provider Last Dose Status Informant  ALPRAZolam  (XANAX ) 0.25 MG tablet [id number] Yes Take 2 tablets (0.5 mg total) by mouth at bedtime as needed for anxiety. Marylynn Verneita CROME, MD  Active Self  azithromycin  (ZITHROMAX ) 500 MG tablet [id number] Yes Take 1 tablet (500 mg total) by mouth daily for 3 days. Vernon Ranks, MD  Active   buPROPion  (WELLBUTRIN  XL) 300 MG 24 hr tablet [id number] Yes Take 1 tablet (300 mg total) by mouth daily. Marylynn Verneita CROME, MD  Active Self  cetirizine (ZYRTEC) 10 MG tablet [id number] Yes Take 10 mg by mouth daily. [provider]  Active Self  citalopram  (CELEXA ) 40 MG tablet [id number] Yes Take 1 tablet (40 mg total) by mouth daily. Marylynn Verneita CROME, MD  Active Self    Discontinued 10/03/16 [phone]   Estradiol  (YUVAFEM ) 10 MCG TABS  vaginal tablet [id number] Yes Insert into vagina  twice weekly Tullo, Teresa L, MD  Active Self  folic acid  (FOLVITE ) 1 MG tablet [id number] Yes Take 1 tablet (1 mg total) by mouth daily.   Active Self  hydroxychloroquine  (PLAQUENIL ) 200 MG tablet [id number] Yes Take 1 tablet (200 mg total) by mouth 2 (two) times daily. Tobie Lady POUR, MD  Active Self  methotrexate  [facility]) 2.5 MG tablet [id number] Yes Take 5 tablets (12.5 mg total) by mouth every 7 (seven) days all on the same day.   Active Self           Med Note [facility], ELIZABETH A   Wed Nov 08, 2023  5:33 PM) fri  ondansetron  (ZOFRAN ) 4 MG tablet [id number] Yes Take 1 tablet (4 mg total) by mouth every 8 (eight) hours as needed for nausea or vomiting. Marylynn Verneita CROME, MD  Active Self  zolpidem  (AMBIEN  CR) 6.25 MG CR tablet [id number] Yes Take 1 tablet (6.25 mg total) by mouth at bedtime as needed for sleep. Marylynn Verneita CROME, MD  Active Self            Home Care and Equipment/Supplies: Were Home Health Services Ordered?: NA Any new equipment or medical supplies ordered?: NA  Functional Questionnaire: Do you need assistance with bathing/showering or dressing?: No Do you need assistance with meal preparation?: No Do you need assistance with eating?: No Do you have difficulty maintaining continence: No Do you need assistance with getting out of bed/getting out of a chair/moving?: No Do you have difficulty managing or taking your medications?: No  Follow up appointments reviewed: PCP Follow-up appointment confirmed?: No (declined) Specialist Hospital Follow-up  appointment confirmed?: NA Do you need transportation to your follow-up appointment?: No Do you understand care options if your condition(s) worsen?: Yes-patient verbalized understanding    SIGNATURE Julian Lemmings, LPN [facility] Nurse Health Advisor Direct Dial [phone]

## 2023-11-16 ENCOUNTER — Other Ambulatory Visit: Payer: Self-pay

## 2023-11-17 ENCOUNTER — Other Ambulatory Visit: Payer: Self-pay

## 2023-11-21 ENCOUNTER — Other Ambulatory Visit: Payer: Self-pay

## 2023-11-22 ENCOUNTER — Other Ambulatory Visit: Payer: Self-pay

## 2023-11-23 ENCOUNTER — Other Ambulatory Visit: Payer: Self-pay

## 2023-11-24 ENCOUNTER — Other Ambulatory Visit: Payer: Self-pay

## 2023-11-25 ENCOUNTER — Other Ambulatory Visit: Payer: Self-pay

## 2023-11-26 ENCOUNTER — Other Ambulatory Visit: Payer: Self-pay

## 2023-11-27 ENCOUNTER — Other Ambulatory Visit: Payer: Self-pay

## 2023-11-29 ENCOUNTER — Other Ambulatory Visit: Payer: Self-pay

## 2023-11-30 ENCOUNTER — Other Ambulatory Visit: Payer: Self-pay

## 2023-11-30 MED ORDER — METHOTREXATE SODIUM 2.5 MG PO TABS
12.5000 mg | ORAL_TABLET | ORAL | 1 refills | Status: AC
  Filled 2023-11-30: qty 60, 84d supply, fill #0
  Filled 2024-02-26: qty 60, 84d supply, fill #1

## 2023-12-08 ENCOUNTER — Ambulatory Visit: Admitting: Internal Medicine

## 2023-12-13 ENCOUNTER — Other Ambulatory Visit: Payer: Self-pay | Admitting: Internal Medicine

## 2023-12-13 ENCOUNTER — Encounter: Payer: Self-pay | Admitting: *Deleted

## 2023-12-13 DIAGNOSIS — F1011 Alcohol abuse, in remission: Secondary | ICD-10-CM | POA: Diagnosis not present

## 2023-12-13 DIAGNOSIS — F4323 Adjustment disorder with mixed anxiety and depressed mood: Secondary | ICD-10-CM | POA: Diagnosis not present

## 2023-12-13 DIAGNOSIS — F1024 Alcohol dependence with alcohol-induced mood disorder: Secondary | ICD-10-CM | POA: Diagnosis not present

## 2023-12-13 NOTE — Telephone Encounter (Signed)
 Requesting: Xanax  Contract: No UDS: N/A Last Visit: 06/02/2023 Next Visit: 12/22/2023 Last Refill: 08/11/2023   Please Advise

## 2023-12-14 ENCOUNTER — Other Ambulatory Visit: Payer: Self-pay

## 2023-12-14 MED ORDER — ALPRAZOLAM 0.25 MG PO TABS
0.5000 mg | ORAL_TABLET | Freq: Every evening | ORAL | 5 refills | Status: AC | PRN
  Filled 2023-12-14: qty 30, 15d supply, fill #0
  Filled 2024-01-26: qty 30, 15d supply, fill #1
  Filled 2024-02-23: qty 30, 15d supply, fill #2
  Filled 2024-03-26: qty 30, 15d supply, fill #3
  Filled 2024-04-16: qty 30, 15d supply, fill #0
  Filled 2024-04-16: qty 30, 15d supply, fill #4

## 2023-12-22 ENCOUNTER — Other Ambulatory Visit: Payer: Self-pay | Admitting: Internal Medicine

## 2023-12-22 ENCOUNTER — Other Ambulatory Visit: Payer: Self-pay

## 2023-12-22 ENCOUNTER — Ambulatory Visit: Admitting: Internal Medicine

## 2023-12-23 MED FILL — Zolpidem Tartrate Tab ER 6.25 MG: ORAL | 30 days supply | Qty: 30 | Fill #0 | Status: AC

## 2023-12-24 ENCOUNTER — Other Ambulatory Visit: Payer: Self-pay

## 2023-12-25 ENCOUNTER — Other Ambulatory Visit: Payer: Self-pay

## 2023-12-28 ENCOUNTER — Other Ambulatory Visit: Payer: Self-pay | Admitting: Internal Medicine

## 2023-12-28 ENCOUNTER — Other Ambulatory Visit: Payer: Self-pay

## 2023-12-29 ENCOUNTER — Other Ambulatory Visit: Payer: Self-pay

## 2023-12-29 MED FILL — Bupropion HCl Tab ER 24HR 300 MG: ORAL | 90 days supply | Qty: 90 | Fill #0 | Status: AC

## 2024-01-04 ENCOUNTER — Other Ambulatory Visit: Payer: Self-pay

## 2024-01-04 ENCOUNTER — Other Ambulatory Visit: Payer: Self-pay | Admitting: Internal Medicine

## 2024-01-04 MED ORDER — CITALOPRAM HYDROBROMIDE 40 MG PO TABS
40.0000 mg | ORAL_TABLET | Freq: Every day | ORAL | 3 refills | Status: AC
  Filled 2024-01-04 – 2024-04-16 (×2): qty 90, 90d supply, fill #0
  Filled 2024-04-16: qty 90, 90d supply, fill #1

## 2024-01-05 DIAGNOSIS — D863 Sarcoidosis of skin: Secondary | ICD-10-CM | POA: Diagnosis not present

## 2024-01-05 DIAGNOSIS — M19042 Primary osteoarthritis, left hand: Secondary | ICD-10-CM | POA: Diagnosis not present

## 2024-01-05 DIAGNOSIS — Z7960 Long term (current) use of unspecified immunomodulators and immunosuppressants: Secondary | ICD-10-CM | POA: Diagnosis not present

## 2024-01-05 DIAGNOSIS — M19041 Primary osteoarthritis, right hand: Secondary | ICD-10-CM | POA: Diagnosis not present

## 2024-01-08 DIAGNOSIS — Z85828 Personal history of other malignant neoplasm of skin: Secondary | ICD-10-CM | POA: Diagnosis not present

## 2024-01-08 DIAGNOSIS — D225 Melanocytic nevi of trunk: Secondary | ICD-10-CM | POA: Diagnosis not present

## 2024-01-08 DIAGNOSIS — M059 Rheumatoid arthritis with rheumatoid factor, unspecified: Secondary | ICD-10-CM | POA: Diagnosis not present

## 2024-01-08 DIAGNOSIS — L821 Other seborrheic keratosis: Secondary | ICD-10-CM | POA: Diagnosis not present

## 2024-01-08 DIAGNOSIS — D2262 Melanocytic nevi of left upper limb, including shoulder: Secondary | ICD-10-CM | POA: Diagnosis not present

## 2024-01-08 DIAGNOSIS — D2362 Other benign neoplasm of skin of left upper limb, including shoulder: Secondary | ICD-10-CM | POA: Diagnosis not present

## 2024-01-08 DIAGNOSIS — M3501 Sicca syndrome with keratoconjunctivitis: Secondary | ICD-10-CM | POA: Diagnosis not present

## 2024-01-08 DIAGNOSIS — D2261 Melanocytic nevi of right upper limb, including shoulder: Secondary | ICD-10-CM | POA: Diagnosis not present

## 2024-01-08 DIAGNOSIS — D2272 Melanocytic nevi of left lower limb, including hip: Secondary | ICD-10-CM | POA: Diagnosis not present

## 2024-01-08 DIAGNOSIS — H25013 Cortical age-related cataract, bilateral: Secondary | ICD-10-CM | POA: Diagnosis not present

## 2024-01-08 DIAGNOSIS — Z08 Encounter for follow-up examination after completed treatment for malignant neoplasm: Secondary | ICD-10-CM | POA: Diagnosis not present

## 2024-01-08 DIAGNOSIS — D2271 Melanocytic nevi of right lower limb, including hip: Secondary | ICD-10-CM | POA: Diagnosis not present

## 2024-01-08 DIAGNOSIS — Z79899 Other long term (current) drug therapy: Secondary | ICD-10-CM | POA: Diagnosis not present

## 2024-01-08 LAB — OPHTHALMOLOGY REPORT-SCANNED

## 2024-01-26 ENCOUNTER — Other Ambulatory Visit: Payer: Self-pay

## 2024-01-26 DIAGNOSIS — F1011 Alcohol abuse, in remission: Secondary | ICD-10-CM | POA: Diagnosis not present

## 2024-01-26 DIAGNOSIS — F4323 Adjustment disorder with mixed anxiety and depressed mood: Secondary | ICD-10-CM | POA: Diagnosis not present

## 2024-01-26 DIAGNOSIS — F1024 Alcohol dependence with alcohol-induced mood disorder: Secondary | ICD-10-CM | POA: Diagnosis not present

## 2024-01-26 MED FILL — Zolpidem Tartrate Tab ER 6.25 MG: ORAL | 30 days supply | Qty: 30 | Fill #1 | Status: AC

## 2024-01-29 ENCOUNTER — Encounter: Payer: Self-pay | Admitting: Internal Medicine

## 2024-01-29 ENCOUNTER — Other Ambulatory Visit: Payer: Self-pay

## 2024-01-29 ENCOUNTER — Ambulatory Visit: Admitting: Internal Medicine

## 2024-01-29 VITALS — BP 100/70 | HR 79 | Ht 67.0 in | Wt 166.8 lb

## 2024-01-29 DIAGNOSIS — G4700 Insomnia, unspecified: Secondary | ICD-10-CM

## 2024-01-29 DIAGNOSIS — D863 Sarcoidosis of skin: Secondary | ICD-10-CM

## 2024-01-29 MED ORDER — BUPROPION HCL ER (XL) 150 MG PO TB24
150.0000 mg | ORAL_TABLET | Freq: Every day | ORAL | 1 refills | Status: AC
Start: 2024-01-29 — End: ?
  Filled 2024-01-29: qty 90, 90d supply, fill #0

## 2024-01-29 NOTE — Assessment & Plan Note (Signed)
 Reducing wellbutrin  today to 150 mg.  Encouraged to use non pharmacologice methods to manage intrusive anxious thoughts at night

## 2024-01-29 NOTE — Progress Notes (Unsigned)
 Subjective:  Patient ID: Chrissie KANDICE Gander, female    DOB: [birth year]  Age: 58 y.o. MRN: [id number]  CC: There were no encounter diagnoses.   HPI Kade TASIA LIZ presents for  Chief Complaint  Patient presents with   Medical Management of Chronic Issues   1) nocturnal anxiety :  has been alcohol abstinent for a year.  Gets ready for bed then gets overcome with anxiety   Outpatient Medications Prior to Visit  Medication Sig Dispense Refill   ALPRAZolam  (XANAX ) 0.25 MG tablet Take 2 tablets (0.5 mg total) by mouth at bedtime as needed for anxiety. 30 tablet 5   buPROPion  (WELLBUTRIN  XL) 300 MG 24 hr tablet Take 1 tablet (300 mg total) by mouth daily. 90 tablet 1   cetirizine (ZYRTEC) 10 MG tablet Take 10 mg by mouth daily.     citalopram  (CELEXA ) 40 MG tablet Take 1 tablet (40 mg total) by mouth daily. 90 tablet 3   Estradiol  (YUVAFEM ) 10 MCG TABS vaginal tablet Insert into vagina  twice weekly 24 tablet 3   folic acid  (FOLVITE ) 1 MG tablet Take 1 tablet (1 mg total) by mouth daily. 90 tablet 3   hydroxychloroquine  (PLAQUENIL ) 200 MG tablet Take 1 tablet (200 mg total) by mouth 2 (two) times daily. 60 tablet 5   methotrexate  (RHEUMATREX) 2.5 MG tablet Take 5 tablets (12.5 mg total) by mouth every 7 (seven) days all on the same day. 60 tablet 1   ondansetron  (ZOFRAN ) 4 MG tablet Take 1 tablet (4 mg total) by mouth every 8 (eight) hours as needed for nausea or vomiting. 20 tablet 0   zolpidem  (AMBIEN  CR) 6.25 MG CR tablet Take 1 tablet (6.25 mg total) by mouth at bedtime as needed for sleep. 30 tablet 5   dicyclomine  (BENTYL ) 20 MG tablet Take 1 tablet (20 mg total) by mouth every 6 (six) hours. 90 tablet 2   No facility-administered medications prior to visit.    Review of Systems;  Patient denies headache, fevers, malaise, unintentional weight loss, skin rash, eye pain, sinus congestion and sinus pain, sore throat, dysphagia,  hemoptysis , cough, dyspnea, wheezing, chest pain,  palpitations, orthopnea, edema, abdominal pain, nausea, melena, diarrhea, constipation, flank pain, dysuria, hematuria, urinary  Frequency, nocturia, numbness, tingling, seizures,  Focal weakness, Loss of consciousness,  Tremor, insomnia, depression, anxiety, and suicidal ideation.      Objective:  BP 100/70   Pulse 79   Ht 5' 7 (1.702 m)   Wt 166 lb 12.8 oz (75.7 kg)   LMP 05/05/2016 (Approximate)   SpO2 96%   BMI 26.12 kg/m   BP Readings from Last 3 Encounters:  01/29/24 100/70  11/10/23 102/77  06/02/23 122/78    Wt Readings from Last 3 Encounters:  01/29/24 166 lb 12.8 oz (75.7 kg)  11/08/23 152 lb (68.9 kg)  06/02/23 163 lb (73.9 kg)    Physical Exam  Lab Results  Component Value Date   HGBA1C 5.9 06/02/2023   HGBA1C 5.6 11/26/2021    Lab Results  Component Value Date   CREATININE 0.73 11/10/2023   CREATININE 0.77 11/09/2023   CREATININE 0.88 11/08/2023    Lab Results  Component Value Date   WBC 5.5 11/10/2023   HGB 12.4 11/10/2023   HCT 37.6 11/10/2023   PLT 244 11/10/2023   GLUCOSE 99 11/10/2023   CHOL 152 06/02/2023   TRIG 64.0 06/02/2023   HDL 55.70 06/02/2023   LDLDIRECT 83.0 06/02/2023   LDLCALC 84 06/02/2023  ALT 16 11/08/2023   AST 16 11/08/2023   NA 141 11/10/2023   K 3.5 11/10/2023   CL 107 11/10/2023   CREATININE 0.73 11/10/2023   BUN 6 11/10/2023   CO2 28 11/10/2023   TSH 0.99 06/02/2023   INR 1.0 10/22/2018   INR 1.0 10/22/2018   HGBA1C 5.9 06/02/2023    CT ABDOMEN PELVIS W CONTRAST Result Date: 11/08/2023 CLINICAL DATA:  Acute abdominal pain EXAM: CT ABDOMEN AND PELVIS WITH CONTRAST TECHNIQUE: Multidetector CT imaging of the abdomen and pelvis was performed using the standard protocol following bolus administration of intravenous contrast. RADIATION DOSE REDUCTION: This exam was performed according to the departmental dose-optimization program which includes automated exposure control, adjustment of the mA and/or kV according to  patient size and/or use of iterative reconstruction technique. CONTRAST:  100mL OMNIPAQUE  IOHEXOL  300 MG/ML  SOLN COMPARISON:  None Available. FINDINGS: Lower chest: No acute abnormality. Hepatobiliary: No focal liver abnormality is seen. No gallstones, gallbladder wall thickening, or biliary dilatation. Pancreas: Unremarkable. No pancreatic ductal dilatation or surrounding inflammatory changes. Spleen: Normal in size without focal abnormality. Adrenals/Urinary Tract: There is diffuse bladder wall thickening versus normal under distension. The kidneys and adrenal glands are within normal limits. Stomach/Bowel: There is mild diffuse colonic wall thickening without surrounding inflammatory stranding. The appendix is normal in size. There some scattered colonic diverticula. There is no pneumatosis, bowel obstruction or free air. Stomach is within normal limits. Vascular/Lymphatic: No significant vascular findings are present. No enlarged abdominal or pelvic lymph nodes. Reproductive: Uterus and bilateral adnexa are unremarkable. Other: No abdominal wall hernia or abnormality. No abdominopelvic ascites. Musculoskeletal: Degenerative changes affect the spine. IMPRESSION: 1. Mild diffuse colonic wall thickening worrisome for colitis. 2. Diffuse bladder wall thickening versus normal under distension. Correlate clinically for cystitis. Electronically Signed   By: Greig Pique M.D.   On: 11/08/2023 16:51    Assessment & Plan:  .There are no diagnoses linked to this encounter.   I spent 34 minutes on the day of this face to face encounter reviewing patient's  most recent visit with cardiology,  nephrology,  and neurology,  prior relevant surgical and non surgical procedures, recent  labs and imaging studies, counseling on weight management,  reviewing the assessment and plan with patient, and post visit ordering and reviewing of  diagnostics and therapeutics with patient  .   Follow-up: No follow-ups on  file.   Verneita LITTIE Kettering, MD

## 2024-01-29 NOTE — Patient Instructions (Addendum)
 [email or web address]  is a [employer] out of [place] that  posts a morning and evening devotional prayer every day on YouTube  . They are  Wonderful!!!!      Consider getting the pneumonia vaccine (prevnar 20)

## 2024-01-30 NOTE — Assessment & Plan Note (Signed)
 Now managed by [facility] with placquenil  , MTX /folic acid   reminded to follow up with [facility] for a baseline exam.

## 2024-02-13 ENCOUNTER — Other Ambulatory Visit: Payer: Self-pay

## 2024-02-14 ENCOUNTER — Other Ambulatory Visit: Payer: Self-pay

## 2024-02-16 ENCOUNTER — Other Ambulatory Visit: Payer: Self-pay

## 2024-02-16 ENCOUNTER — Other Ambulatory Visit: Payer: Self-pay | Admitting: Internal Medicine

## 2024-02-17 ENCOUNTER — Other Ambulatory Visit: Payer: Self-pay

## 2024-02-19 ENCOUNTER — Other Ambulatory Visit: Payer: Self-pay

## 2024-02-19 ENCOUNTER — Other Ambulatory Visit: Payer: Self-pay | Admitting: Internal Medicine

## 2024-02-20 ENCOUNTER — Other Ambulatory Visit: Payer: Self-pay

## 2024-02-20 ENCOUNTER — Encounter: Payer: Self-pay | Admitting: Internal Medicine

## 2024-02-20 ENCOUNTER — Other Ambulatory Visit

## 2024-02-20 DIAGNOSIS — R300 Dysuria: Secondary | ICD-10-CM

## 2024-02-20 NOTE — Addendum Note (Signed)
 Addended by: TANDA HARVEY D on: 02/20/2024 03:22 PM   Modules accepted: Orders

## 2024-02-20 NOTE — Telephone Encounter (Signed)
 LMTCB

## 2024-02-20 NOTE — Telephone Encounter (Signed)
 I have pended the orders for your approval. Would you like for me to add her on at the end of the day on Thursday?

## 2024-02-20 NOTE — Telephone Encounter (Signed)
 Patient called and is scheduled for today, at 4pm lab and a virtual on 02/22/24 @ 1pm.

## 2024-02-21 ENCOUNTER — Encounter: Payer: Self-pay | Admitting: Internal Medicine

## 2024-02-21 ENCOUNTER — Ambulatory Visit: Admitting: General Practice

## 2024-02-22 ENCOUNTER — Telehealth: Admitting: Internal Medicine

## 2024-02-22 ENCOUNTER — Other Ambulatory Visit: Payer: Self-pay

## 2024-02-22 ENCOUNTER — Encounter: Payer: Self-pay | Admitting: Internal Medicine

## 2024-02-22 VITALS — Ht 67.0 in | Wt 166.8 lb

## 2024-02-22 DIAGNOSIS — B9620 Unspecified Escherichia coli [E. coli] as the cause of diseases classified elsewhere: Secondary | ICD-10-CM | POA: Diagnosis not present

## 2024-02-22 DIAGNOSIS — N390 Urinary tract infection, site not specified: Secondary | ICD-10-CM

## 2024-02-22 MED ORDER — CIPROFLOXACIN HCL 250 MG PO TABS
250.0000 mg | ORAL_TABLET | Freq: Two times a day (BID) | ORAL | 0 refills | Status: AC
  Filled 2024-02-22: qty 6, 3d supply, fill #0

## 2024-02-22 NOTE — Progress Notes (Signed)
 Virtual Visit via Caregility   Note   This format is felt to be most appropriate for this patient at this time.  All issues noted in this document were discussed and addressed.  No physical exam was performed (except for noted visual exam findings with Video Visits).   I connected withNAME@ on 02/27/24 at  1:00 PM EST by a video enabled telemedicine application or telephone and verified that I am speaking with the correct person using two identifiers. Location patient: home Location provider: work or home office Persons participating in the virtual visit: patient, provider  I discussed the limitations, risks, security and privacy concerns of performing an evaluation and management service by telephone and the availability of in person appointments. I also discussed with the patient that there may be a patient responsible charge related to this service. The patient expressed understanding and agreed to proceed.   Reason for visit: uti  HPI:  Paula IS A 58 YR OLD Jensen who presents with improving symptoms of UTI  compared to the day she submitted a specimen for culture. Symptoms of dysuria and frequency started on Dec 1 and were treqted with Azo for a few days but resolved as of last night.    ROS: See pertinent positives and negatives per HPI.  Past Medical History:  Diagnosis Date   allergic rhinitis    Anxiety    Cancer (HCC)    skin   Depression    Family history of adverse reaction to anesthesia    Daughter - PONV   GERD (gastroesophageal reflux disease)    Wears contact lenses     Past Surgical History:  Procedure Laterality Date   COLONOSCOPY WITH PROPOFOL  N/A 08/23/2019   Procedure: COLONOSCOPY WITH PROPOFOL ;  Surgeon: Jinny Carmine, MD;  Location: [facility];  Service: Endoscopy;  Laterality: N/A;  priority 4   DILATION AND CURETTAGE OF UTERUS  June 2012   [place] , for heavy bleeding    laparoscopy  1996   TONSILLECTOMY AND ADENOIDECTOMY      Family History   Problem Relation Age of Onset   Hypertension Mother    Atrial fibrillation Father    Cancer Neg Hx    Breast cancer Neg Hx     SOCIAL HX:  reports that she has quit smoking. Her smoking use included cigarettes. She has never used smokeless tobacco. She reports current alcohol use of about 10.0 standard drinks of alcohol per week. She reports that she does not use drugs.    Current Outpatient Medications:    ALPRAZolam  (XANAX ) 0.25 MG tablet, Take 2 tablets (0.5 mg total) by mouth at bedtime as needed for anxiety., Disp: 30 tablet, Rfl: 5   buPROPion  (WELLBUTRIN  XL) 150 MG 24 hr tablet, Take 1 tablet (150 mg total) by mouth daily., Disp: 90 tablet, Rfl: 1   cetirizine (ZYRTEC) 10 MG tablet, Take 10 mg by mouth daily., Disp: , Rfl:    citalopram  (CELEXA ) 40 MG tablet, Take 1 tablet (40 mg total) by mouth daily., Disp: 90 tablet, Rfl: 3   Estradiol  (YUVAFEM ) 10 MCG TABS vaginal tablet, Insert into vagina  twice weekly, Disp: 24 tablet, Rfl: 3   folic acid  (FOLVITE ) 1 MG tablet, Take 1 tablet (1 mg total) by mouth daily., Disp: 90 tablet, Rfl: 3   hydroxychloroquine  (PLAQUENIL ) 200 MG tablet, Take 1 tablet (200 mg total) by mouth 2 (two) times daily., Disp: 60 tablet, Rfl: 5   methotrexate  (RHEUMATREX) 2.5 MG tablet, Take 5 tablets (  12.5 mg total) by mouth every 7 (seven) days all on the same day., Disp: 60 tablet, Rfl: 1   ondansetron  (ZOFRAN ) 4 MG tablet, Take 1 tablet (4 mg total) by mouth every 8 (eight) hours as needed for nausea or vomiting., Disp: 20 tablet, Rfl: 0   zolpidem  (AMBIEN  CR) 6.25 MG CR tablet, Take 1 tablet (6.25 mg total) by mouth at bedtime as needed for sleep., Disp: 30 tablet, Rfl: 5  EXAM:  VITALS per patient if applicable:  GENERAL: alert, oriented, appears well and in no acute distress  HEENT: atraumatic, conjunttiva clear, no obvious abnormalities on inspection of external nose and ears  NECK: normal movements of the head and neck  LUNGS: on inspection no  signs of respiratory distress, breathing rate appears normal, no obvious gross SOB, gasping or wheezing  CV: no obvious cyanosis  MS: moves all visible extremities without noticeable abnormality  PSYCH/NEURO: pleasant and cooperative, no obvious depression or anxiety, speech and thought processing grossly intact  ASSESSMENT AND PLAN: E. coli UTI (urinary tract infection) Assessment & Plan: Sending Empiric treatment with ciprofloxacin  250 mg bid  sent to pharmacy based on degree of pyuria and preliminary culture data   Other orders -     Ciprofloxacin  HCl; Take 1 tablet (250 mg total) by mouth 2 (two) times daily for 3 days.  Dispense: 6 tablet; Refill: 0      I discussed the assessment and treatment plan with the patient. The patient was provided an opportunity to ask questions and all were answered. The patient agreed with the plan and demonstrated an understanding of the instructions.   The patient was advised to call back or seek an in-person evaluation if the symptoms worsen or if the condition fails to improve as anticipated.   I spent 30 minutes dedicated to the care of this patient on the date of this encounter to include pre-visit review of patient's medical history,  including recent ER visit, imaging studies and labs, face-to-face time with the patient , and post visit ordering of testing and therapeutics.    Paula LITTIE Kettering, MD

## 2024-02-22 NOTE — Assessment & Plan Note (Signed)
 Sending Empiric treatment with ciprofloxacin  250 mg bid  sent to pharmacy based on degree of pyuria and preliminary culture data

## 2024-02-23 ENCOUNTER — Other Ambulatory Visit: Payer: Self-pay

## 2024-02-23 LAB — URINALYSIS, ROUTINE W REFLEX MICROSCOPIC
Bacteria, UA: NONE SEEN /HPF
Bilirubin Urine: NEGATIVE
Glucose, UA: NEGATIVE
Hyaline Cast: NONE SEEN /LPF
Ketones, ur: NEGATIVE
Nitrite: POSITIVE — AB
Specific Gravity, Urine: 1.015 (ref 1.001–1.035)
pH: <=5 — AB (ref 5.0–8.0)

## 2024-02-23 LAB — MICROSCOPIC MESSAGE

## 2024-02-23 LAB — URINE CULTURE
MICRO NUMBER:: 17301543
SPECIMEN QUALITY:: ADEQUATE

## 2024-02-24 ENCOUNTER — Ambulatory Visit: Payer: Self-pay | Admitting: Internal Medicine

## 2024-02-26 ENCOUNTER — Other Ambulatory Visit: Payer: Self-pay

## 2024-02-26 MED FILL — Zolpidem Tartrate Tab ER 6.25 MG: ORAL | 30 days supply | Qty: 30 | Fill #2 | Status: AC

## 2024-02-27 ENCOUNTER — Other Ambulatory Visit: Payer: Self-pay

## 2024-02-27 ENCOUNTER — Other Ambulatory Visit: Payer: Self-pay | Admitting: Internal Medicine

## 2024-02-27 MED ORDER — FOLIC ACID 1 MG PO TABS
1.0000 mg | ORAL_TABLET | Freq: Every day | ORAL | 3 refills | Status: AC
  Filled 2024-02-27 – 2024-03-12 (×2): qty 90, 90d supply, fill #0

## 2024-03-08 ENCOUNTER — Other Ambulatory Visit: Payer: Self-pay

## 2024-03-12 ENCOUNTER — Other Ambulatory Visit: Payer: Self-pay

## 2024-03-13 ENCOUNTER — Other Ambulatory Visit: Payer: Self-pay

## 2024-03-19 ENCOUNTER — Other Ambulatory Visit: Payer: Self-pay

## 2024-03-26 ENCOUNTER — Other Ambulatory Visit: Payer: Self-pay

## 2024-03-27 ENCOUNTER — Other Ambulatory Visit: Payer: Self-pay

## 2024-03-27 ENCOUNTER — Other Ambulatory Visit (HOSPITAL_COMMUNITY): Payer: Self-pay

## 2024-03-27 MED ORDER — HYDROXYCHLOROQUINE SULFATE 200 MG PO TABS
200.0000 mg | ORAL_TABLET | Freq: Two times a day (BID) | ORAL | 5 refills | Status: AC
  Filled 2024-03-27: qty 60, 30d supply, fill #0

## 2024-04-01 MED FILL — Zolpidem Tartrate Tab ER 6.25 MG: ORAL | 30 days supply | Qty: 30 | Fill #3 | Status: AC

## 2024-04-02 ENCOUNTER — Other Ambulatory Visit: Payer: Self-pay

## 2024-04-16 ENCOUNTER — Other Ambulatory Visit: Payer: Self-pay

## 2024-04-16 ENCOUNTER — Other Ambulatory Visit (HOSPITAL_COMMUNITY): Payer: Self-pay

## 2024-08-02 ENCOUNTER — Ambulatory Visit: Admitting: Internal Medicine
